# Patient Record
Sex: Female | Born: 1955 | Race: White | Hispanic: No | Marital: Single | Smoking: Never smoker
Health system: Southern US, Community
[De-identification: ages and names within clinical notes are randomized; demographics above are authoritative.]

## PROBLEM LIST (undated history)

## (undated) DIAGNOSIS — G43909 Migraine, unspecified, not intractable, without status migrainosus: Secondary | ICD-10-CM

## (undated) DIAGNOSIS — E559 Vitamin D deficiency, unspecified: Secondary | ICD-10-CM

## (undated) DIAGNOSIS — C801 Malignant (primary) neoplasm, unspecified: Secondary | ICD-10-CM

## (undated) DIAGNOSIS — E611 Iron deficiency: Secondary | ICD-10-CM

## (undated) HISTORY — DX: Vitamin D deficiency, unspecified: E55.9

---

## 1975-09-14 HISTORY — PX: MELANOMA EXCISION: SHX5266

## 2002-03-05 ENCOUNTER — Encounter: Payer: Self-pay | Admitting: Family Medicine

## 2002-03-05 ENCOUNTER — Ambulatory Visit (HOSPITAL_COMMUNITY): Admission: RE | Admit: 2002-03-05 | Discharge: 2002-03-05 | Payer: Self-pay | Admitting: Family Medicine

## 2002-03-08 ENCOUNTER — Ambulatory Visit (HOSPITAL_COMMUNITY): Admission: RE | Admit: 2002-03-08 | Discharge: 2002-03-08 | Payer: Self-pay | Admitting: Family Medicine

## 2002-03-08 ENCOUNTER — Encounter: Payer: Self-pay | Admitting: Family Medicine

## 2003-07-10 ENCOUNTER — Ambulatory Visit (HOSPITAL_COMMUNITY): Admission: RE | Admit: 2003-07-10 | Discharge: 2003-07-10 | Payer: Self-pay | Admitting: Family Medicine

## 2011-06-02 ENCOUNTER — Other Ambulatory Visit (HOSPITAL_COMMUNITY)
Admission: RE | Admit: 2011-06-02 | Discharge: 2011-06-02 | Disposition: A | Discharge status: Home / Self Care | Payer: BC Managed Care – PPO | Source: Ambulatory Visit | Attending: Obstetrics & Gynecology | Admitting: Obstetrics & Gynecology

## 2011-06-02 DIAGNOSIS — Z01419 Encounter for gynecological examination (general) (routine) without abnormal findings: Secondary | ICD-10-CM | POA: Insufficient documentation

## 2011-09-01 NOTE — Telephone Encounter (Signed)
error 

## 2012-01-28 ENCOUNTER — Other Ambulatory Visit: Payer: Self-pay | Admitting: Radiation Oncology

## 2012-08-01 ENCOUNTER — Other Ambulatory Visit (HOSPITAL_COMMUNITY)
Admission: RE | Admit: 2012-08-01 | Discharge: 2012-08-01 | Disposition: A | Discharge status: Home / Self Care | Payer: BC Managed Care – PPO | Source: Ambulatory Visit | Attending: Obstetrics & Gynecology | Admitting: Obstetrics & Gynecology

## 2012-08-01 DIAGNOSIS — Z1151 Encounter for screening for human papillomavirus (HPV): Secondary | ICD-10-CM | POA: Insufficient documentation

## 2012-08-01 DIAGNOSIS — Z01419 Encounter for gynecological examination (general) (routine) without abnormal findings: Secondary | ICD-10-CM | POA: Insufficient documentation

## 2013-03-09 ENCOUNTER — Other Ambulatory Visit (HOSPITAL_COMMUNITY): Payer: Self-pay | Admitting: Family Medicine

## 2013-03-09 DIAGNOSIS — H9209 Otalgia, unspecified ear: Secondary | ICD-10-CM

## 2013-03-09 DIAGNOSIS — I881 Chronic lymphadenitis, except mesenteric: Secondary | ICD-10-CM

## 2013-03-13 ENCOUNTER — Ambulatory Visit (HOSPITAL_COMMUNITY)
Admission: RE | Admit: 2013-03-13 | Discharge: 2013-03-13 | Disposition: A | Discharge status: Home / Self Care | Payer: BC Managed Care – PPO | Source: Ambulatory Visit | Attending: Family Medicine | Admitting: Family Medicine

## 2013-03-13 DIAGNOSIS — I889 Nonspecific lymphadenitis, unspecified: Secondary | ICD-10-CM | POA: Insufficient documentation

## 2013-03-13 DIAGNOSIS — I881 Chronic lymphadenitis, except mesenteric: Secondary | ICD-10-CM

## 2013-03-13 DIAGNOSIS — R599 Enlarged lymph nodes, unspecified: Secondary | ICD-10-CM | POA: Insufficient documentation

## 2013-03-13 DIAGNOSIS — H9209 Otalgia, unspecified ear: Secondary | ICD-10-CM | POA: Insufficient documentation

## 2013-03-29 ENCOUNTER — Telehealth: Payer: Self-pay

## 2013-03-29 NOTE — Telephone Encounter (Signed)
Pt called to schedule screening colonoscopy. She has never had one. Has hemorrhoids and appt is scheduled with Florene Glen , NP for 04/17/2013 at 2:00 PM.

## 2013-04-17 ENCOUNTER — Encounter: Payer: Self-pay | Admitting: Gastroenterology

## 2013-04-17 ENCOUNTER — Other Ambulatory Visit: Payer: Self-pay | Admitting: Gastroenterology

## 2013-04-17 ENCOUNTER — Ambulatory Visit (INDEPENDENT_AMBULATORY_CARE_PROVIDER_SITE_OTHER): Payer: BC Managed Care – PPO | Admitting: Gastroenterology

## 2013-04-17 VITALS — BP 141/86 | HR 81 | Temp 98.6°F | Ht 67.5 in | Wt 168.8 lb

## 2013-04-17 DIAGNOSIS — Z1211 Encounter for screening for malignant neoplasm of colon: Secondary | ICD-10-CM

## 2013-04-17 MED ORDER — PEG 3350-KCL-NA BICARB-NACL 420 G PO SOLR: 4000.0000 mL | ORAL | Status: DC

## 2013-04-17 NOTE — Patient Instructions (Addendum)
We have scheduled you for a colonoscopy with Dr. Darrick Penna in the near future.  Further recommendations to follow.  Have a wonderful school year!

## 2013-04-17 NOTE — Progress Notes (Signed)
  Primary Care Physician:  Colette Ribas, MD Primary Gastroenterologist:  Dr. Darrick Penna   Chief Complaint  Patient presents with  . Colonoscopy  . Hemorrhoids    HPI:   57 year old female presenting today for initial screening colonoscopy. Notes possible hemorrhoid without pain, occasional scant hematochezia. No issues with hemorrhoids at time of visit. No constipation or diarrhea. No abdominal pain. Rare heartburn, usually related to food choices. No dysphagia.   Past Medical History  Diagnosis Date  . Vitamin D deficiency     Past Surgical History  Procedure Laterality Date  . Melanoma excision  1977    Current Outpatient Prescriptions  Medication Sig Dispense Refill  . cholecalciferol (VITAMIN D) 1000 UNITS tablet Take 1,000 Units by mouth daily.       No current facility-administered medications for this visit.    Allergies as of 04/17/2013  . (No Known Allergies)    Family History  Problem Relation Age of Onset  . Colon cancer Neg Hx     History   Social History  . Marital Status: Single    Spouse Name: N/A    Number of Children: N/A  . Years of Education: N/A   Occupational History  . 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 Smoking status: Never Smoker   . Smokeless tobacco: Not on file  . Alcohol Use: Yes     Comment: once a month, rare  . Drug Use: No  . Sexually Active: Not on file   Other Topics Concern  . Not on file   Social History Narrative  . No narrative on file    Review of Systems: Gen: Denies any fever, chills, fatigue, weight loss, lack of appetite.  CV: Denies chest pain, heart palpitations, peripheral edema, syncope.  Resp: Denies shortness of breath at rest or with exertion. Denies wheezing or cough.  GI: Denies dysphagia or odynophagia. Denies jaundice, hematemesis, fecal incontinence. GU : Denies urinary burning, urinary frequency, urinary hesitancy MS: occasional  joint pain Derm: Denies rash, itching, dry skin Psych: Denies depression, anxiety, memory loss, and confusion Heme: Denies bruising, bleeding, and enlarged lymph nodes.  Physical Exam: BP 141/86  Pulse 81  Temp(Src) 98.6 F (37 C) (Oral)  Ht 5' 7.5" (1.715 m)  Wt 168 lb 12.8 oz (76.567 kg)  BMI 26.03 kg/m2 General:   Alert and oriented. Pleasant and cooperative. Well-nourished and well-developed.  Head:  Normocephalic and atraumatic. Eyes:  Without icterus, sclera clear and conjunctiva pink.  Ears:  Normal auditory acuity. Nose:  No deformity, discharge,  or lesions. Mouth:  No deformity or lesions, oral mucosa pink.  Neck:  Supple, without mass or thyromegaly. Lungs:  Clear to auscultation bilaterally. No wheezes, rales, or rhonchi. No distress.  Heart:  S1, S2 present without murmurs appreciated.  Abdomen:  +BS, soft, non-tender and non-distended. No HSM noted. No guarding or rebound. No masses appreciated.  Rectal:  Deferred until colonoscopy  Msk:  Symmetrical without gross deformities. Normal posture. Extremities:  Without clubbing or edema. Neurologic:  Alert and  oriented x4;  grossly normal neurologically. Skin:  Intact without significant lesions or rashes. Cervical Nodes:  No significant cervical adenopathy. Psych:  Alert and cooperative. Normal mood and affect.

## 2013-04-17 NOTE — Assessment & Plan Note (Signed)
57 year old female is presenting today for initial screening colonoscopy. She has no lower GI symptoms such as rectal bleeding, change in bowel habits, abdominal pain. Occasional hemorrhoid discomfort with scant paper hematochezia, likely benign anorectal source. Will need colonoscopy in near future. May be a candidate for outpatient hemorrhoid banding.   Proceed with colonoscopy with Dr. Darrick Penna in the near future. The risks, benefits, and alternatives have been discussed in detail with the patient. They state understanding and desire to proceed.

## 2013-04-19 ENCOUNTER — Encounter (HOSPITAL_COMMUNITY): Payer: Self-pay | Admitting: Pharmacy Technician

## 2013-04-19 NOTE — Progress Notes (Signed)
Cc PCP 

## 2013-04-24 ENCOUNTER — Encounter (HOSPITAL_COMMUNITY)
Admission: RE | Disposition: A | Discharge status: Home / Self Care | Payer: Self-pay | Source: Ambulatory Visit | Attending: Gastroenterology

## 2013-04-24 ENCOUNTER — Encounter (HOSPITAL_COMMUNITY): Payer: Self-pay | Admitting: *Deleted

## 2013-04-24 ENCOUNTER — Ambulatory Visit (HOSPITAL_COMMUNITY)
Admission: RE | Admit: 2013-04-24 | Discharge: 2013-04-24 | Disposition: A | Discharge status: Home / Self Care | Payer: BC Managed Care – PPO | Source: Ambulatory Visit | Attending: Gastroenterology | Admitting: Gastroenterology

## 2013-04-24 DIAGNOSIS — K644 Residual hemorrhoidal skin tags: Secondary | ICD-10-CM

## 2013-04-24 DIAGNOSIS — K648 Other hemorrhoids: Secondary | ICD-10-CM

## 2013-04-24 DIAGNOSIS — Z1211 Encounter for screening for malignant neoplasm of colon: Secondary | ICD-10-CM | POA: Insufficient documentation

## 2013-04-24 HISTORY — PX: COLONOSCOPY: SHX5424

## 2013-04-24 SURGERY — COLONOSCOPY
Anesthesia: Moderate Sedation

## 2013-04-24 MED ORDER — MIDAZOLAM HCL 5 MG/5ML IJ SOLN
INTRAMUSCULAR | Status: AC
  Filled 2013-04-24: qty 10

## 2013-04-24 MED ORDER — MIDAZOLAM HCL 5 MG/5ML IJ SOLN
INTRAMUSCULAR | Status: DC | PRN
  Administered 2013-04-24 (×3): 2 mg via INTRAVENOUS

## 2013-04-24 MED ORDER — SODIUM CHLORIDE 0.9 % IV SOLN
INTRAVENOUS | Status: DC
  Administered 2013-04-24: 13:00:00 via INTRAVENOUS

## 2013-04-24 MED ORDER — MEPERIDINE HCL 100 MG/ML IJ SOLN
INTRAMUSCULAR | Status: DC | PRN
  Administered 2013-04-24 (×3): 25 mg via INTRAVENOUS

## 2013-04-24 MED ORDER — MEPERIDINE HCL 100 MG/ML IJ SOLN
INTRAMUSCULAR | Status: AC
  Filled 2013-04-24: qty 1

## 2013-04-24 MED ORDER — STERILE WATER FOR IRRIGATION IR SOLN
Status: DC | PRN
  Administered 2013-04-24: 14:00:00

## 2013-04-24 NOTE — Op Note (Signed)
[facility] [address] [place], [address]   COLONOSCOPY PROCEDURE REPORT  PATIENT: Alicia Mcdonald, Alicia Mcdonald  MR#: [id number] BIRTHDATE: 1955/10/14 , 57  yrs. old GENDER: Female ENDOSCOPIST: Jonette Eva, MD REFERRED VZ:DGLO Phillips Odor, M.D. PROCEDURE DATE:  04/24/2013 PROCEDURE:   Colonoscopy, screening INDICATIONS:Average risk patient for colon cancer. MEDICATIONS: Demerol 75 mg IV and Versed 6 mg IV  DESCRIPTION OF PROCEDURE:    Physical exam was performed.  Informed consent was obtained from the patient after explaining the benefits, risks, and alternatives to procedure.  The patient was connected to monitor and placed in left lateral position. Continuous oxygen was provided by nasal cannula and IV medicine administered through an indwelling cannula.  After administration of sedation and rectal exam, the patients rectum was intubated and the EC-[id number] ([id number])  colonoscope was advanced under direct visualization to the ileum.  The scope was removed slowly by carefully examining the color, texture, anatomy, and integrity mucosa on the way out.  The patient was recovered in endoscopy and discharged home in satisfactory condition.       COLON FINDINGS: The mucosa appeared normal in the terminal ileum.  , A normal appearing cecum, ileocecal valve, and appendiceal orifice were identified.  The ascending, hepatic flexure, transverse, splenic flexure, descending, sigmoid colon and rectum appeared unremarkable.  No polyps or cancers were seen.  , Small internal hemorrhoids were found.  , and Moderate sized external hemorrhoids were found.  PREP QUALITY: excellent.  CECAL W/D TIME: 12 minutes COMPLICATIONS: None  ENDOSCOPIC IMPRESSION: 1.   Normal colon 2.   Small internal hemorrhoids 3.   Moderate sized external hemorrhoids   RECOMMENDATIONS: HIGH FIBER DIET TCS IN 10 YEARS       _______________________________ eSignedJonette Eva, MD 04/24/2013  3:46 PM

## 2013-04-24 NOTE — H&P (Signed)
  Primary Care Physician:  Colette Ribas, MD Primary Gastroenterologist:  Dr. Darrick Penna  Pre-Procedure History & Physical: HPI:  Alicia Mcdonald is a 57 y.o. female here for COLON CANCER SCREENING.  Past Medical History  Diagnosis Date  . Vitamin D deficiency     Past Surgical History  Procedure Laterality Date  . Melanoma excision  1977    Prior to Admission medications   Medication Sig Start Date End Date Taking? Authorizing Provider  cholecalciferol (VITAMIN D) 1000 UNITS tablet Take 1,000 Units by mouth daily.   Yes Historical Provider, MD  VITAMIN K PO Take 1 tablet by mouth daily.   Yes Historical Provider, MD    Allergies as of 04/17/2013  . (No Known Allergies)    Family History  Problem Relation Age of Onset  . Colon cancer Neg Hx     History   Social History  . Marital Status: Single    Spouse Name: N/A    Number of Children: N/A  . Years of Education: N/A   Occupational History  . 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 Smoking status: Never Smoker   . Smokeless tobacco: Not on file  . Alcohol Use: Yes     Comment: once a month, rare  . Drug Use: No  . Sexually Active: Not on file   Other Topics Concern  . Not on file   Social History Narrative  . No narrative on file    Review of Systems: See HPI, otherwise negative ROS   Physical Exam: BP 148/81  Pulse 74  Temp(Src) 97.8 F (36.6 C) (Oral)  Resp 19  SpO2 93% General:   Alert,  pleasant and cooperative in NAD Head:  Normocephalic and atraumatic. Neck:  Supple; Lungs:  Clear throughout to auscultation.    Heart:  Regular rate and rhythm. Abdomen:  Soft, nontender and nondistended. Normal bowel sounds, without guarding, and without rebound.   Neurologic:  Alert and  oriented x4;  grossly normal neurologically.  Impression/Plan:     SCREENING  Plan:  1. TCS TODAY

## 2013-04-27 ENCOUNTER — Encounter (HOSPITAL_COMMUNITY): Payer: Self-pay | Admitting: Gastroenterology

## 2013-11-27 NOTE — Progress Notes (Signed)
REVIEWED. TCS AUG 2014 [facility]

## 2015-05-06 ENCOUNTER — Other Ambulatory Visit: Payer: Self-pay | Admitting: Obstetrics & Gynecology

## 2015-07-11 ENCOUNTER — Encounter: Payer: Self-pay | Admitting: Obstetrics & Gynecology

## 2015-07-11 ENCOUNTER — Other Ambulatory Visit (HOSPITAL_COMMUNITY)
Admission: RE | Admit: 2015-07-11 | Discharge: 2015-07-11 | Disposition: A | Discharge status: Home / Self Care | Payer: BC Managed Care – PPO | Source: Ambulatory Visit | Attending: Obstetrics & Gynecology | Admitting: Obstetrics & Gynecology

## 2015-07-11 ENCOUNTER — Ambulatory Visit (INDEPENDENT_AMBULATORY_CARE_PROVIDER_SITE_OTHER): Payer: BC Managed Care – PPO | Admitting: Obstetrics & Gynecology

## 2015-07-11 VITALS — BP 120/80 | HR 74 | Ht 67.2 in | Wt 164.3 lb

## 2015-07-11 DIAGNOSIS — Z01419 Encounter for gynecological examination (general) (routine) without abnormal findings: Secondary | ICD-10-CM | POA: Diagnosis present

## 2015-07-11 DIAGNOSIS — Z1151 Encounter for screening for human papillomavirus (HPV): Secondary | ICD-10-CM | POA: Diagnosis present

## 2015-07-11 DIAGNOSIS — Z1211 Encounter for screening for malignant neoplasm of colon: Secondary | ICD-10-CM

## 2015-07-11 DIAGNOSIS — Z1212 Encounter for screening for malignant neoplasm of rectum: Secondary | ICD-10-CM

## 2015-07-11 NOTE — Progress Notes (Signed)
Patient ID: Alicia Mcdonald, female   DOB: 1956/06/27, 59 y.o.   MRN: [id number] Subjective:     Alicia Mcdonald is a 59 y.o. female here for a routine exam.  No LMP recorded. Patient is postmenopausal. No obstetric history on file. Birth Control Method:  Post menopausal Menstrual Calendar(currently): post menopausal  Current complaints: none.   Current acute medical issues:  none   Recent Gynecologic History No LMP recorded. Patient is postmenopausal. Last Pap: 2013,  normal Last mammogram: pt declines doing screenign mammography, I have recommended breast thermography,    Past Medical History  Diagnosis Date  . Vitamin D deficiency     Past Surgical History  Procedure Laterality Date  . Melanoma excision  1977  . Colonoscopy N/A 04/24/2013    Procedure: COLONOSCOPY;  Surgeon: Danie Binder, MD;  [facility];  Service: Endoscopy;  Laterality: N/A;  1:45    OB History    No data available      Social History   Social History  . Marital Status: Single    Spouse Name: N/A  . Number of Children: N/A  . Years of Education: N/A   Occupational History  . 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 Smoking status: Never Smoker   . Smokeless tobacco: None  . Alcohol Use: Yes     Comment: once a month, rare  . Drug Use: No  . Sexual Activity: Not Asked   Other Topics Concern  . None   Social History Narrative    Family History  Problem Relation Age of Onset  . Colon cancer Neg Hx      Current outpatient prescriptions:  .  cholecalciferol (VITAMIN D) 1000 UNITS tablet, Take 1,000 Units by mouth daily., Disp: , Rfl:  .  VITAMIN K PO, Take 1 tablet by mouth daily., Disp: , Rfl:   Review of Systems  Review of Systems  Constitutional: Negative for fever, chills, weight loss, malaise/fatigue and diaphoresis.  HENT: Negative for hearing loss, ear pain, nosebleeds, congestion, sore throat, neck pain,  tinnitus and ear discharge.   Eyes: Negative for blurred vision, double vision, photophobia, pain, discharge and redness.  Respiratory: Negative for cough, hemoptysis, sputum production, shortness of breath, wheezing and stridor.   Cardiovascular: Negative for chest pain, palpitations, orthopnea, claudication, leg swelling and PND.  Gastrointestinal: negative for abdominal pain. Negative for heartburn, nausea, vomiting, diarrhea, constipation, blood in stool and melena.  Genitourinary: Negative for dysuria, urgency, frequency, hematuria and flank pain.  Musculoskeletal: Negative for myalgias, back pain, joint pain and falls.  Skin: Negative for itching and rash.  Neurological: Negative for dizziness, tingling, tremors, sensory change, speech change, focal weakness, seizures, loss of consciousness, weakness and headaches.  Endo/Heme/Allergies: Negative for environmental allergies and polydipsia. Does not bruise/bleed easily.  Psychiatric/Behavioral: Negative for depression, suicidal ideas, hallucinations, memory loss and substance abuse. The patient is not nervous/anxious and does not have insomnia.        Objective:  Blood pressure 120/80, pulse 74, height 5' 7.2" (1.707 m), weight 164 lb 4.8 oz (74.526 kg).   Physical Exam  Vitals reviewed. Constitutional: She is oriented to person, place, and time. She appears well-developed and well-nourished.  HENT:  Head: Normocephalic and atraumatic.        Right Ear: External ear normal.  Left Ear: External ear normal.  Nose: Nose normal.  Mouth/Throat: Oropharynx is clear and moist.  Eyes: Conjunctivae and EOM are normal. Pupils are equal, round, and reactive to light. Right eye exhibits no discharge. Left eye exhibits no discharge. No scleral icterus.  Neck: Normal range of motion. Neck supple. No tracheal deviation present. No thyromegaly present.  Cardiovascular: Normal rate, regular rhythm, normal heart sounds and intact distal pulses.  Exam  reveals no gallop and no friction rub.   No murmur heard. Respiratory: Effort normal and breath sounds normal. No respiratory distress. She has no wheezes. She has no rales. She exhibits no tenderness.  GI: Soft. Bowel sounds are normal. She exhibits no distension and no mass. There is no tenderness. There is no rebound and no guarding.  Genitourinary:  Breasts no masses skin changes or nipple changes bilaterally      Vulva is normal without lesions Vagina is pink moist without discharge Cervix normal in appearance and pap is done Uterus is normal size shape and contour Adnexa is negative with normal sized ovaries  {Rectal    hemoccult negative, normal tone, no masses  Musculoskeletal: Normal range of motion. She exhibits no edema and no tenderness.  Neurological: She is alert and oriented to person, place, and time. She has normal reflexes. She displays normal reflexes. No cranial nerve deficit. She exhibits normal muscle tone. Coordination normal.  Skin: Skin is warm and dry. No rash noted. No erythema. No pallor.  Psychiatric: She has a normal mood and affect. Her behavior is normal. Judgment and thought content normal.       Assessment:    Healthy female exam.    Plan:    Follow up in: 2 years. thermography of breast was discussed and recommended

## 2015-07-15 LAB — CYTOLOGY - PAP

## 2015-08-29 ENCOUNTER — Telehealth: Payer: Self-pay | Admitting: Obstetrics & Gynecology

## 2015-08-29 NOTE — Telephone Encounter (Signed)
appt scheduled for 09/03/2015 ultrasound and Dr. Elonda Husky.

## 2015-08-29 NOTE — Telephone Encounter (Signed)
Please make an appointment for a gyn sonogram then see me after her sonogram next week

## 2015-09-02 ENCOUNTER — Other Ambulatory Visit: Payer: Self-pay | Admitting: Obstetrics & Gynecology

## 2015-09-02 ENCOUNTER — Telehealth: Payer: Self-pay | Admitting: *Deleted

## 2015-09-02 DIAGNOSIS — N950 Postmenopausal bleeding: Secondary | ICD-10-CM

## 2015-09-02 NOTE — Telephone Encounter (Signed)
Message left on pt voicemail with pt approval, to keep her appt tomorrow for u/s and Dr. Elonda Husky rare that bleeding was caused by the pap since pap done in 06/2015.

## 2015-09-03 ENCOUNTER — Encounter: Payer: Self-pay | Admitting: Obstetrics & Gynecology

## 2015-09-03 ENCOUNTER — Ambulatory Visit (INDEPENDENT_AMBULATORY_CARE_PROVIDER_SITE_OTHER): Payer: BC Managed Care – PPO | Admitting: Obstetrics & Gynecology

## 2015-09-03 ENCOUNTER — Ambulatory Visit (INDEPENDENT_AMBULATORY_CARE_PROVIDER_SITE_OTHER): Payer: BC Managed Care – PPO

## 2015-09-03 ENCOUNTER — Other Ambulatory Visit: Payer: Self-pay | Admitting: Obstetrics & Gynecology

## 2015-09-03 VITALS — BP 150/90 | HR 92 | Wt 161.0 lb

## 2015-09-03 DIAGNOSIS — N950 Postmenopausal bleeding: Secondary | ICD-10-CM

## 2015-09-03 NOTE — Progress Notes (Signed)
PELVIC US TA/TV: heterogenous anteverted uterus,normal rt ov, simple lt ov cyst 1.4 x .7 x 1cm,EEC 4.8mm,bladder debris (unable to move)

## 2015-09-22 ENCOUNTER — Telehealth: Payer: Self-pay | Admitting: Obstetrics & Gynecology

## 2015-09-22 NOTE — Telephone Encounter (Signed)
Cough will not cause bleeding from the endometrium  Did she miss follow up appt?  In any event no further testing needed at this point with an adequate biopsy

## 2015-09-22 NOTE — Telephone Encounter (Signed)
Pt informed of Benign results from Endometrial Biopsy on 09/03/2015. Pt states she has not had anymore vaginal bleeding but states Dr. Elonda Husky did mention possible additional testing. Pt states she had been having a constant cough and did not know if that could have caused the vaginal bleeding because now the cough has stopped and the vaginal bleeding has too.  Please advise.

## 2015-09-22 NOTE — Telephone Encounter (Signed)
Pt called requesting results, pt states she had it done on the 12/21. Please contact pt

## 2015-10-19 NOTE — Progress Notes (Signed)
Patient ID: Alicia Mcdonald, female   DOB: Dec 23, 1955, 60 y.o.   MRN: [id number] Follow up appointment for results and endometrial biopsy  Chief Complaint  Patient presents with  . Follow-up    ultrasound today.    Blood pressure 150/90, pulse 92, weight 161 lb (73.029 kg).  GYNECOLOGIC SONOGRAM   Alicia Mcdonald is a 60 y.o. for a pelvic sonogram for postmenopausal bleeding.  Uterus 5.4 x 2.2 x 3.3 cm, heterogenous anteverted uterus  Endometrium 4.8 mm, symmetrical   Right ovary 2 x 1 x 1.6 cm, wnl  Left ovary 2.2 x .9 x 2 cm, simple lt ov cyst 1.4 x .7 x 1 cm    No free fluid   Technician Comments:  PELVIC US TA/TV: heterogenous anteverted uterus,normal rt ov, simple lt ov cyst 1.4 x .7 x 1cm,EEC 4.46mm,bladder debris (non mobile)    [employer] 09/03/2015 3:24 PM  Clinical Impression and recommendations:  I have reviewed the sonogram results above, combined with the patient's current clinical course, below are my impressions and any appropriate recommendations for management based on the sonographic findings.  Slightly thickened endometrium for a post menoapusal woman Normal uterus  Both ovaries normal with simple cyst left ovary  Sonographic suspicion of a bladder mass, polyp/lesion that will require urology evaluation, most probably with cystoscopy   [employer]rell [place] H 09/03/2015 4:05 PM    MEDS ordered this encounter: No orders of the defined types were placed in this encounter.    Orders for this encounter: Orders Placed This Encounter  Procedures  . Endometrial biopsy     ICD-9-CM ICD-10-CM   1. Post-menopause bleeding 627.1 N95.0 Endometrial biopsy    Plan: endometrial biopsy today  Follow Up:   Endometrial Biopsy Procedure Note  Pre-operative Diagnosis:    ICD-9-CM ICD-10-CM   1. Post-menopause bleeding 627.1 N95.0 Endometrial biopsy    Post-operative Diagnosis:  same  Indications: postmenopausal bleeding  Procedure Details   Urine pregnancy test was not done.  The risks (including infection, bleeding, pain, and uterine perforation) and benefits of the procedure were explained to the patient and Written informed consent was obtained.  Antibiotic prophylaxis against endocarditis was not indicated.   The patient was placed in the dorsal lithotomy position.  Bimanual exam showed the uterus to be in the neutral position.  A Graves' speculum inserted in the vagina, and the cervix prepped with povidone iodine.  Endocervical curettage with a Kevorkian curette was not performed.   A sharp tenaculum was applied to the anterior lip of the cervix for stabilization.  A sterile uterine sound was used to sound the uterus to a depth of 6cm.  A Pipelle endometrial aspirator was used to sample the endometrium.  Sample was sent for pathologic examination.  Condition: Stable  Complications: None  Plan:  The patient was advised to call for any fever or for prolonged or severe pain or bleeding. She was advised to use OTC analgesics as needed for mild to moderate pain. She was advised to avoid vaginal intercourse for 48 hours or until the bleeding has completely stopped.  Attending Physician Documentation: I was present for or performed the following: endometrial biopsy      All questions were answered.  Past Medical History  Diagnosis Date  . Vitamin D deficiency     Past Surgical History  Procedure Laterality Date  . Melanoma excision  1977  . Colonoscopy N/A 04/24/2013    Procedure: COLONOSCOPY;  Surgeon: Danie Binder, MD;  [facility];  Service: Endoscopy;  Laterality: N/A;  1:45    OB History    No data available      No Known Allergies  Social History   Social History  . Marital Status: Single    Spouse Name: N/A  . Number of Children: N/A  . Years of Education: N/A   Occupational History  . 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 Smoking status: Never Smoker   . Smokeless tobacco: None  . Alcohol Use: Yes     Comment: once a month, rare  . Drug Use: No  . Sexual Activity: Not Asked   Other Topics Concern  . None   Social History Narrative    Family History  Problem Relation Age of Onset  . Colon cancer Neg Hx

## 2019-12-14 ENCOUNTER — Ambulatory Visit: Payer: BC Managed Care – PPO | Attending: Internal Medicine

## 2019-12-14 DIAGNOSIS — Z23 Encounter for immunization: Secondary | ICD-10-CM

## 2019-12-14 NOTE — Progress Notes (Signed)
   Covid-19 Vaccination Clinic  Name:  ROSAMARY KENKEL    MRN: [id number] DOB: 07-13-1956  12/14/2019  Ms. Novick was observed post Covid-19 immunization for 15 minutes without incident. She was provided with Vaccine Information Sheet and instruction to access the V-Safe system.   Ms. League was instructed to call 911 with any severe reactions post vaccine: Marland Kitchen Difficulty breathing  . Swelling of face and throat  . A fast heartbeat  . A bad rash all over body  . Dizziness and weakness   Immunizations Administered    Name Date Dose VIS Date Route   Moderna COVID-19 Vaccine 12/14/2019 12:10 PM 0.5 mL 08/14/2019 Intramuscular   Manufacturer: Moderna   Lot: [id number]   [id number]

## 2020-01-16 ENCOUNTER — Ambulatory Visit: Payer: BC Managed Care – PPO

## 2020-02-03 ENCOUNTER — Other Ambulatory Visit: Payer: Self-pay

## 2020-02-03 ENCOUNTER — Emergency Department (HOSPITAL_COMMUNITY): Payer: BC Managed Care – PPO

## 2020-02-03 ENCOUNTER — Encounter (HOSPITAL_COMMUNITY): Payer: Self-pay

## 2020-02-03 ENCOUNTER — Inpatient Hospital Stay (HOSPITAL_COMMUNITY)
Admission: EM | Admit: 2020-02-03 | Discharge: 2020-02-10 | DRG: 026 | Disposition: A | Discharge status: Home / Self Care | Payer: BC Managed Care – PPO | Attending: Internal Medicine | Admitting: Internal Medicine

## 2020-02-03 DIAGNOSIS — R4182 Altered mental status, unspecified: Secondary | ICD-10-CM | POA: Diagnosis not present

## 2020-02-03 DIAGNOSIS — Z20822 Contact with and (suspected) exposure to covid-19: Secondary | ICD-10-CM | POA: Diagnosis present

## 2020-02-03 DIAGNOSIS — C710 Malignant neoplasm of cerebrum, except lobes and ventricles: Secondary | ICD-10-CM | POA: Diagnosis present

## 2020-02-03 DIAGNOSIS — E871 Hypo-osmolality and hyponatremia: Secondary | ICD-10-CM | POA: Diagnosis present

## 2020-02-03 DIAGNOSIS — G4089 Other seizures: Secondary | ICD-10-CM | POA: Diagnosis present

## 2020-02-03 DIAGNOSIS — R29709 NIHSS score 9: Secondary | ICD-10-CM | POA: Diagnosis present

## 2020-02-03 DIAGNOSIS — E559 Vitamin D deficiency, unspecified: Secondary | ICD-10-CM | POA: Diagnosis present

## 2020-02-03 DIAGNOSIS — G9389 Other specified disorders of brain: Secondary | ICD-10-CM

## 2020-02-03 DIAGNOSIS — R410 Disorientation, unspecified: Secondary | ICD-10-CM | POA: Diagnosis not present

## 2020-02-03 DIAGNOSIS — R569 Unspecified convulsions: Secondary | ICD-10-CM

## 2020-02-03 DIAGNOSIS — D496 Neoplasm of unspecified behavior of brain: Secondary | ICD-10-CM

## 2020-02-03 DIAGNOSIS — E876 Hypokalemia: Secondary | ICD-10-CM | POA: Diagnosis present

## 2020-02-03 DIAGNOSIS — Z781 Physical restraint status: Secondary | ICD-10-CM

## 2020-02-03 DIAGNOSIS — E1165 Type 2 diabetes mellitus with hyperglycemia: Secondary | ICD-10-CM | POA: Diagnosis present

## 2020-02-03 DIAGNOSIS — Z85820 Personal history of malignant melanoma of skin: Secondary | ICD-10-CM | POA: Diagnosis not present

## 2020-02-03 DIAGNOSIS — C719 Malignant neoplasm of brain, unspecified: Secondary | ICD-10-CM

## 2020-02-03 DIAGNOSIS — G911 Obstructive hydrocephalus: Secondary | ICD-10-CM | POA: Diagnosis present

## 2020-02-03 DIAGNOSIS — R4701 Aphasia: Secondary | ICD-10-CM | POA: Diagnosis present

## 2020-02-03 DIAGNOSIS — R220 Localized swelling, mass and lump, head: Secondary | ICD-10-CM | POA: Diagnosis not present

## 2020-02-03 HISTORY — DX: Malignant (primary) neoplasm, unspecified: C80.1

## 2020-02-03 HISTORY — DX: Migraine, unspecified, not intractable, without status migrainosus: G43.909

## 2020-02-03 HISTORY — DX: Iron deficiency: E61.1

## 2020-02-03 LAB — DIFFERENTIAL
Abs Immature Granulocytes: 0.03 10*3/uL (ref 0.00–0.07)
Basophils Absolute: 0 10*3/uL (ref 0.0–0.1)
Basophils Relative: 0 %
Eosinophils Absolute: 0 10*3/uL (ref 0.0–0.5)
Eosinophils Relative: 0 %
Immature Granulocytes: 0 %
Lymphocytes Relative: 6 %
Lymphs Abs: 0.5 10*3/uL — ABNORMAL LOW (ref 0.7–4.0)
Monocytes Absolute: 0.3 10*3/uL (ref 0.1–1.0)
Monocytes Relative: 3 %
Neutro Abs: 8.3 10*3/uL — ABNORMAL HIGH (ref 1.7–7.7)
Neutrophils Relative %: 91 %

## 2020-02-03 LAB — COMPREHENSIVE METABOLIC PANEL
ALT: 26 U/L (ref 0–44)
AST: 26 U/L (ref 15–41)
Albumin: 4.4 g/dL (ref 3.5–5.0)
Alkaline Phosphatase: 70 U/L (ref 38–126)
Anion gap: 14 (ref 5–15)
BUN: 17 mg/dL (ref 8–23)
CO2: 22 mmol/L (ref 22–32)
Calcium: 9.1 mg/dL (ref 8.9–10.3)
Chloride: 90 mmol/L — ABNORMAL LOW (ref 98–111)
Creatinine, Ser: 0.51 mg/dL (ref 0.44–1.00)
GFR calc Af Amer: >60 mL/min (ref >60)
GFR calc non Af Amer: >60 mL/min (ref >60)
Glucose, Bld: 209 mg/dL — ABNORMAL HIGH (ref 70–99)
Potassium: 3.4 mmol/L — ABNORMAL LOW (ref 3.5–5.1)
Sodium: 126 mmol/L — ABNORMAL LOW (ref 135–145)
Total Bilirubin: 0.9 mg/dL (ref 0.3–1.2)
Total Protein: 7.2 g/dL (ref 6.5–8.1)

## 2020-02-03 LAB — ETHANOL: Alcohol, Ethyl (B): <10 mg/dL (ref <10)

## 2020-02-03 LAB — TSH: TSH: 0.646 u[IU]/mL (ref 0.350–4.500)

## 2020-02-03 LAB — HEMOGLOBIN A1C
Hgb A1c MFr Bld: 6.8 % — ABNORMAL HIGH (ref 4.8–5.6)
Mean Plasma Glucose: 148.46 mg/dL

## 2020-02-03 LAB — URINALYSIS, ROUTINE W REFLEX MICROSCOPIC
Bilirubin Urine: NEGATIVE
Glucose, UA: 150 mg/dL — AB
Hgb urine dipstick: NEGATIVE
Ketones, ur: 20 mg/dL — AB
Leukocytes,Ua: NEGATIVE
Nitrite: NEGATIVE
Protein, ur: NEGATIVE mg/dL
Specific Gravity, Urine: 1.016 (ref 1.005–1.030)
pH: 7 (ref 5.0–8.0)

## 2020-02-03 LAB — OSMOLALITY: Osmolality: 276 mOsm/kg (ref 275–295)

## 2020-02-03 LAB — CBC
HCT: 40 % (ref 36.0–46.0)
Hemoglobin: 13.4 g/dL (ref 12.0–15.0)
MCH: 29.7 pg (ref 26.0–34.0)
MCHC: 33.5 g/dL (ref 30.0–36.0)
MCV: 88.7 fL (ref 80.0–100.0)
Platelets: 272 10*3/uL (ref 150–400)
RBC: 4.51 MIL/uL (ref 3.87–5.11)
RDW: 12.5 % (ref 11.5–15.5)
WBC: 9.1 10*3/uL (ref 4.0–10.5)
nRBC: 0 % (ref 0.0–0.2)

## 2020-02-03 LAB — CBG MONITORING, ED
Glucose-Capillary: 188 mg/dL — ABNORMAL HIGH (ref 70–99)
Glucose-Capillary: 168 mg/dL — ABNORMAL HIGH (ref 70–99)

## 2020-02-03 LAB — HIV ANTIBODY (ROUTINE TESTING W REFLEX): HIV Screen 4th Generation wRfx: NONREACTIVE

## 2020-02-03 LAB — APTT: aPTT: 27 seconds (ref 24–36)

## 2020-02-03 LAB — RAPID URINE DRUG SCREEN, HOSP PERFORMED
Amphetamines: NOT DETECTED
Barbiturates: NOT DETECTED
Benzodiazepines: NOT DETECTED
Cocaine: NOT DETECTED
Opiates: NOT DETECTED
Tetrahydrocannabinol: NOT DETECTED

## 2020-02-03 LAB — GLUCOSE, CAPILLARY
Glucose-Capillary: 123 mg/dL — ABNORMAL HIGH (ref 70–99)
Glucose-Capillary: 137 mg/dL — ABNORMAL HIGH (ref 70–99)

## 2020-02-03 LAB — SARS CORONAVIRUS 2 BY RT PCR (HOSPITAL ORDER, PERFORMED IN ~~LOC~~ HOSPITAL LAB): SARS Coronavirus 2: NEGATIVE

## 2020-02-03 LAB — PROTIME-INR
INR: 1 (ref 0.8–1.2)
Prothrombin Time: 12.7 seconds (ref 11.4–15.2)

## 2020-02-03 MED ORDER — LORAZEPAM 2 MG/ML IJ SOLN
1.0000 mg | Freq: Once | INTRAMUSCULAR | Status: AC
  Administered 2020-02-03: 1 mg via INTRAVENOUS
  Filled 2020-02-03: qty 1

## 2020-02-03 MED ORDER — POTASSIUM CHLORIDE 10 MEQ/100ML IV SOLN
10.0000 meq | INTRAVENOUS | Status: AC
  Administered 2020-02-03 (×4): 10 meq via INTRAVENOUS
  Filled 2020-02-03 (×4): qty 100

## 2020-02-03 MED ORDER — ONDANSETRON HCL 4 MG PO TABS: 4.0000 mg | ORAL_TABLET | Freq: Four times a day (QID) | ORAL | Status: DC | PRN

## 2020-02-03 MED ORDER — LORAZEPAM 2 MG/ML IJ SOLN: 1.0000 mg | Freq: Once | INTRAMUSCULAR | Status: DC

## 2020-02-03 MED ORDER — HALOPERIDOL LACTATE 5 MG/ML IJ SOLN
2.0000 mg | Freq: Four times a day (QID) | INTRAMUSCULAR | Status: DC | PRN
  Administered 2020-02-03 – 2020-02-04 (×2): 2 mg via INTRAVENOUS
  Filled 2020-02-03 (×2): qty 1

## 2020-02-03 MED ORDER — ACETAMINOPHEN 325 MG PO TABS: 650.0000 mg | ORAL_TABLET | Freq: Four times a day (QID) | ORAL | Status: DC | PRN

## 2020-02-03 MED ORDER — DEXAMETHASONE SODIUM PHOSPHATE 4 MG/ML IJ SOLN
4.0000 mg | Freq: Four times a day (QID) | INTRAMUSCULAR | Status: DC
  Administered 2020-02-03 – 2020-02-04 (×2): 4 mg via INTRAVENOUS
  Filled 2020-02-03 (×2): qty 1

## 2020-02-03 MED ORDER — ONDANSETRON HCL 4 MG/2ML IJ SOLN
4.0000 mg | Freq: Once | INTRAMUSCULAR | Status: AC
  Administered 2020-02-03: 4 mg via INTRAVENOUS
  Filled 2020-02-03: qty 2

## 2020-02-03 MED ORDER — SODIUM CHLORIDE 0.9% FLUSH
3.0000 mL | Freq: Once | INTRAVENOUS | Status: AC
  Administered 2020-02-03: 3 mL via INTRAVENOUS

## 2020-02-03 MED ORDER — IOHEXOL 350 MG/ML SOLN: 100.0000 mL | Freq: Once | INTRAVENOUS | Status: DC | PRN

## 2020-02-03 MED ORDER — DEXAMETHASONE SODIUM PHOSPHATE 10 MG/ML IJ SOLN
10.0000 mg | Freq: Once | INTRAMUSCULAR | Status: AC
  Administered 2020-02-03: 10 mg via INTRAVENOUS
  Filled 2020-02-03: qty 1

## 2020-02-03 MED ORDER — SODIUM CHLORIDE 0.9 % IV BOLUS
500.0000 mL | Freq: Once | INTRAVENOUS | Status: AC
  Administered 2020-02-03: 500 mL via INTRAVENOUS

## 2020-02-03 MED ORDER — INSULIN ASPART 100 UNIT/ML ~~LOC~~ SOLN
0.0000 [IU] | SUBCUTANEOUS | Status: DC
  Administered 2020-02-03: 1 [IU] via SUBCUTANEOUS
  Administered 2020-02-03: 2 [IU] via SUBCUTANEOUS
  Administered 2020-02-04: 1 [IU] via SUBCUTANEOUS
  Administered 2020-02-04: 2 [IU] via SUBCUTANEOUS
  Administered 2020-02-04: 1 [IU] via SUBCUTANEOUS
  Administered 2020-02-06: 2 [IU] via SUBCUTANEOUS
  Administered 2020-02-06: 3 [IU] via SUBCUTANEOUS
  Administered 2020-02-07: 5 [IU] via SUBCUTANEOUS
  Administered 2020-02-07 (×2): 3 [IU] via SUBCUTANEOUS
  Administered 2020-02-07: 2 [IU] via SUBCUTANEOUS
  Administered 2020-02-07: 5 [IU] via SUBCUTANEOUS
  Administered 2020-02-08 (×3): 1 [IU] via SUBCUTANEOUS
  Administered 2020-02-08 (×2): 3 [IU] via SUBCUTANEOUS
  Administered 2020-02-08: 2 [IU] via SUBCUTANEOUS
  Administered 2020-02-09: 3 [IU] via SUBCUTANEOUS
  Administered 2020-02-09 – 2020-02-10 (×4): 1 [IU] via SUBCUTANEOUS
  Administered 2020-02-10: 2 [IU] via SUBCUTANEOUS
  Filled 2020-02-03: qty 1

## 2020-02-03 MED ORDER — SODIUM CHLORIDE 0.9 % IV SOLN
INTRAVENOUS | Status: DC
  Administered 2020-02-03: 500 mL via INTRAVENOUS

## 2020-02-03 MED ORDER — ONDANSETRON HCL 4 MG/2ML IJ SOLN: 4.0000 mg | Freq: Four times a day (QID) | INTRAMUSCULAR | Status: DC | PRN

## 2020-02-03 MED ORDER — LEVETIRACETAM IN NACL 500 MG/100ML IV SOLN: 500.0000 mg | Freq: Once | INTRAVENOUS | Status: DC

## 2020-02-03 MED ORDER — LEVETIRACETAM IN NACL 1500 MG/100ML IV SOLN
1500.0000 mg | Freq: Once | INTRAVENOUS | Status: AC
  Administered 2020-02-03: 1500 mg via INTRAVENOUS
  Filled 2020-02-03: qty 100

## 2020-02-03 MED ORDER — ACETAMINOPHEN 650 MG RE SUPP: 650.0000 mg | Freq: Four times a day (QID) | RECTAL | Status: DC | PRN

## 2020-02-03 MED ORDER — LEVETIRACETAM IN NACL 500 MG/100ML IV SOLN
500.0000 mg | Freq: Two times a day (BID) | INTRAVENOUS | Status: DC
  Administered 2020-02-04 – 2020-02-07 (×8): 500 mg via INTRAVENOUS
  Filled 2020-02-03 (×10): qty 100

## 2020-02-03 MED ORDER — LORAZEPAM 2 MG/ML IJ SOLN
2.0000 mg | INTRAMUSCULAR | Status: DC | PRN
  Administered 2020-02-03: 2 mg via INTRAVENOUS
  Filled 2020-02-03: qty 1

## 2020-02-03 NOTE — Consult Note (Signed)
[facility] Tele[facility] Consult Services   Date of Service:   02/03/2020 10:19:48  Impression:     .  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  Metrics: Last Known Well: 02/02/2020 23:00:00 [facility] Notification Time: 02/03/2020 10:19:47 Arrival Time: 02/03/2020 09:52:00 Stamp Time: 02/03/2020 10:19:48 Time First Login Attempt: 02/03/2020 10:23:00 Symptoms: AMS NIHSS Start Assessment Time: 02/03/2020 10:26:00 Patient is not a candidate for Alteplase/Activase. Alteplase Medical Decision: 02/03/2020 10:26:00 Patient was not deemed candidate for Alteplase/Activase thrombolytics because of following reasons: Intracranial Intra-Axial Neoplasm.  CT head was reviewed and results were: hypodensity in the genu of the corpus callosum with surrounding mass effect concerning for glioma  Clinical Presentation is not Suggestive of Large Vessel Occlusive Disease  ED Physician notified of diagnostic impression and management plan on 02/03/2020 10:38:00  Our recommendations are outlined below.  Recommendations:     .  Dexamethasone 10mg  now     .  Load Keppra 1500mg  now (in event she is having focal seizure activity)     .  EEG     .  MRI brain wow     .  Neurosurgery consultation (prob higher yield after MRI done)     .  Oncology consultation (prob higher yield after MRI done)  Routine Consultation with [facility] for Follow up Care  Sign Out:     .  Discussed with Emergency Department Provider    ------------------------------------------------------------------------------  History of Present Illness: Patient is a 64 year old Female.  Patient was brought by EMS for symptoms of AMS  Patient with no significant chronic medical  issues. She was reporting a HA to her mother yesterday, and then started vomiting prior to bed. Her mother then found her at 72 in the bathroom confused and not making sense with her speech. Patient continued to decline with worsening language function and agitation. EMS was summoned.        NIHSS may not be reliable due to: patient agitated and encephalopathic  Examination: BP(143/77), Pulse(60), Blood Glucose(223) 1A: Level of Consciousness - Alert; keenly responsive + 0 1B: Ask Month and Age - Aphasic + 2 1C: Blink Eyes & Squeeze Hands - Performs 0 Tasks + 2 2: Test Horizontal Extraocular Movements - Normal + 0 3: Test Visual Fields - No Visual Loss + 0 4: Test Facial Palsy (Use Grimace if Obtunded) - Normal symmetry + 0 5A: Test Left Arm Motor Drift - No Drift for 10 Seconds + 0 (pulling with great force in all extremities against nursing) 5B: Test Right Arm Motor Drift - No Drift for 10 Seconds + 0 6A: Test Left Leg Motor Drift - No Drift for 5 Seconds + 0 6B: Test Right Leg Motor Drift - No Drift for 5 Seconds + 0 7: Test Limb Ataxia (FNF/Heel-Shin) - No Ataxia + 0 8: Test Sensation - Normal; No sensory loss + 0 9: Test Language/Aphasia - Mute/Global Aphasia: No Usable Speech/Auditory Comprehension + 3 10: Test Dysarthria - Mute/Anarthric + 2 11: Test Extinction/Inattention - No abnormality + 0  NIHSS Score: 9  Pre-Morbid Modified Ranking Scale: 0 Points = No symptoms at all   Patient/Family was informed the Neurology Consult would occur via TeleHealth consult by way of interactive audio and video telecommunications and consented to receiving care in this manner.   Patient is being evaluated for  possible acute neurologic impairment and high probability of imminent or life-threatening deterioration. I spent total of 20 minutes providing care to this patient, including time for face to face visit via telemedicine, review of medical records, imaging studies and discussion of  findings with providers, the patient and/or family.   Dr Arne Cleveland   [facility] [phone]  Case [id number]

## 2020-02-03 NOTE — H&P (Signed)
History and Physical    Alicia Mcdonald [id number] DOB: 06-09-1956 DOA: 02/03/2020  PCP: Redmond School, MD   Patient coming from: Home  Chief Complaint: Altered mental status  HPI: Alicia Mcdonald is a 64 y.o. female with medical history significant for vitamin D deficiency who was brought to the ED via EMS after her mother found her in the bathroom with significant confusion.  She was also noted to have vomited.  Apparently patient has been having severe headaches for several days and at the time of EMS arrival her speech was quite incoherent.  No other symptoms of fevers, chills, chest pain, shortness of breath, cough, abdominal pain, or diarrhea reported by the mother.  No specific weakness or numbness to the extremities or visual field deficits have been noted.  Patient is currently quite somnolent and encephalopathic and history could not be obtained directly.  I have called the mother who is the primary historian.  No other significant chronic medical issues are noted.   ED Course: Stable vital signs are noted and patient has EKG in sinus rhythm.  Serum sodium is 126 and potassium is 3.4 with glucose of 209.  Her 1 view chest x-ray demonstrates some left lower lobe opacity which appears to reflect atelectasis and Covid testing is pending.  CT scanning of the head was immediately performed on arrival under code stroke protocol and she is noted to have an anterior hemispheric brain mass suspicious for glioma versus lymphoma.  Teleneurology suspects that patient may have had a seizure and thus she has been loaded with Keppra and given some Ativan.  She has also been started on IV dexamethasone 10 mg and recommendations have been made for brain MRI as well as EEG evaluation.  EDP has also discussed case with neurosurgery Dr. Christella Noa who recommends transfer to [facility] for further evaluation.  Review of Systems: Cannot be obtained due to patient condition.  Past Medical History:  Diagnosis Date    . Cancer ([place])   . Iron deficiency   . Migraine   . Vitamin D deficiency     Past Surgical History:  Procedure Laterality Date  . COLONOSCOPY N/A 04/24/2013   Procedure: COLONOSCOPY;  Surgeon: Danie Binder, MD;  Location: [facility] ENDO SUITE;  Service: Endoscopy;  Laterality: N/A;  1:45  . MELANOMA EXCISION  1977     reports that she has never smoked. She has never used smokeless tobacco. She reports current alcohol use. She reports that she does not use drugs.  No Known Allergies  Family History  Problem Relation Age of Onset  . Colon cancer Neg Hx     Prior to Admission medications   Medication Sig Start Date End Date Taking? Authorizing Provider  cholecalciferol (VITAMIN D) 1000 UNITS tablet Take 1,000 Units by mouth daily.   Yes [provider]  VITAMIN K PO Take 1 tablet by mouth daily. Reported on 09/03/2015   Yes [provider]    Physical Exam: Vitals:   02/03/20 1008 02/03/20 1030 02/03/20 1100 02/03/20 1200  BP:  (!) 158/77 (!) 161/77 122/76  Pulse:  80 67 87  Resp:  18 16   SpO2:  98% 97%   Weight: 70.3 kg     Height: 5\' 8"  (1.727 m)       Constitutional: NAD, quite somnolent and difficult to arouse Vitals:   02/03/20 1008 02/03/20 1030 02/03/20 1100 02/03/20 1200  BP:  (!) 158/77 (!) 161/77 122/76  Pulse:  80 67 87  Resp:  18 16   SpO2:  98% 97%   Weight: 70.3 kg     Height: 5\' 8"  (1.727 m)      Eyes: lids and conjunctivae normal ENMT: Mucous membranes cannot be assessed. Neck: normal, supple Respiratory: clear to auscultation bilaterally. Normal respiratory effort. No accessory muscle use.  Cardiovascular: Regular rate and rhythm, no murmurs. No extremity edema. Abdomen: no distention. Bowel sounds positive.  Musculoskeletal: No edema to lower extremities noted. Skin: no rashes, lesions, ulcers.  Psychiatric: Cannot be assessed.  Labs on Admission: I have personally reviewed following labs and imaging studies  CBC: Recent Labs   Lab 02/03/20 1030  WBC 9.1  NEUTROABS 8.3*  HGB 13.4  HCT 40.0  MCV 88.7  PLT [id number]   Basic Metabolic Panel: Recent Labs  Lab 02/03/20 1030  NA 126*  K 3.4*  CL 90*  CO2 22  GLUCOSE 209*  BUN 17  CREATININE 0.51  CALCIUM 9.1   GFR: Estimated Creatinine Clearance: 71.7 mL/min (by C-G formula based on SCr of 0.51 mg/dL). Liver Function Tests: Recent Labs  Lab 02/03/20 1030  AST 26  ALT 26  ALKPHOS 70  BILITOT 0.9  PROT 7.2  ALBUMIN 4.4   No results for input(s): LIPASE, AMYLASE in the last 168 hours. No results for input(s): AMMONIA in the last 168 hours. Coagulation Profile: Recent Labs  Lab 02/03/20 1030  INR 1.0   Cardiac Enzymes: No results for input(s): CKTOTAL, CKMB, CKMBINDEX, TROPONINI in the last 168 hours. BNP (last 3 results) No results for input(s): PROBNP in the last 8760 hours. HbA1C: No results for input(s): HGBA1C in the last 72 hours. CBG: Recent Labs  Lab 02/03/20 1111  GLUCAP 188*   Lipid Profile: No results for input(s): CHOL, HDL, LDLCALC, TRIG, CHOLHDL, LDLDIRECT in the last 72 hours. Thyroid Function Tests: No results for input(s): TSH, T4TOTAL, FREET4, T3FREE, THYROIDAB in the last 72 hours. Anemia Panel: No results for input(s): VITAMINB12, FOLATE, FERRITIN, TIBC, IRON, RETICCTPCT in the last 72 hours. Urine analysis: No results found for: COLORURINE, APPEARANCEUR, LABSPEC, [place], GLUCOSEU, HGBUR, BILIRUBINUR, KETONESUR, PROTEINUR, UROBILINOGEN, NITRITE, LEUKOCYTESUR  Radiological Exams on Admission: DG Chest Port 1 View  Result Date: 02/03/2020 CLINICAL DATA:  Altered mental status EXAM: PORTABLE CHEST 1 VIEW COMPARISON:  Partial comparison to limited CT chest dated 03/08/2012 FINDINGS: Mild increased interstitial markings. Left lower lobe opacity, likely atelectasis. No pleural effusion or pneumothorax. The heart is normal in size. Old right 3rd and 4th rib fracture deformities. IMPRESSION: Mild left lower lobe opacity,  likely atelectasis. Electronically Signed   By: Julian Hy M.D.   On: 02/03/2020 11:41   CT HEAD CODE STROKE WO CONTRAST  Addendum Date: 02/03/2020   ADDENDUM REPORT: 02/03/2020 10:18 ADDENDUM: This study was inadvertently signed before the report was completed, please disregard the above. Interhemispheric mass centered at the genu of the corpus callosum, fornices, and septum pellucidum with peripheral isodensity and central low-density. Ill-defined low-density seen in the bifrontal white matter. Findings are primarily concerning for aggressive glioma or lymphoma. There is mass effect on the foramen Monro with mild lateral ventriculomegaly but no herniation or shift. No acute hemorrhage. Case discussed with Dr. Rogene Houston Electronically Signed   By: Monte Fantasia M.D.   On: 02/03/2020 10:18   Result Date: 02/03/2020 CLINICAL DATA:  Code stroke. EXAM: CT HEAD WITHOUT CONTRAST TECHNIQUE: Contiguous axial images were obtained from the base of the skull through the vertex without intravenous contrast. COMPARISON:  None. FINDINGS:  Brain: Masslike appearance at the anterior interhemispheric fissure along the fornices, genu of the corpus callosum, and septum pellucidum. The size of peripheral isodense appearance and central low-density. Vascular: Skull: Sinuses/Orbits: Other: These results were called by telephone at the time of interpretation on 02/03/2020 at 10:10 am to provider Fredia Sorrow , who verbally acknowledged these results. ASPECTS [facility] Stroke Program Early CT Score) - Ganglionic level infarction (caudate, lentiform nuclei, internal capsule, insula, M1-M3 cortex): - Supraganglionic infarction (M4-M6 cortex): Total score (0-10 with 10 being normal): IMPRESSION: 1. 2. ASPECTS is Electronically Signed: By: Monte Fantasia M.D. On: 02/03/2020 10:12    EKG: Sinus rhythm 67 bpm.  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 Mickeal Skinner with neuro-oncology by 5/24 -Continue dexamethasone 4 mg IV every 6 hours -MRI with and without contrast for further evaluation  Mild hyponatremia -Normal saline IV -Recheck a.m. labs -Check urine and serum osmolarity as well as TSH  Mild hypokalemia -Replete with IV and reevaluate in a.m.  Hyperglycemia -No reported history of diabetes noted -Plan to maintain on low-dose SSI for now with every 4 hour Accu-Cheks while n.p.o. -Check hemoglobin A1c  History of vitamin D deficiency -Hold current supplementation   DVT prophylaxis: SCDs Code Status: Full Family Communication: Discussed with mother Zurri Obryan on phone Disposition Plan:Transfer to [facility] for Neurosurgery and Neurology evaluation Consults called:Neurosurgery Dr. Christella Noa by EDP and Neurology Dr. Rory Percy by self Admission status: Inpatient, Progressive Care Status is: Inpatient  Remains inpatient appropriate because:Altered mental status, Ongoing diagnostic testing needed not appropriate for outpatient work up and Unsafe d/c plan   Dispo: The patient is from: Home              Anticipated d/c is to: Home              Anticipated d/c date is: 3 days              Patient currently is not medically stable to d/c.   Evellyn Tuff D Manuella Ghazi DO [facility]  If 7PM-7AM, please contact night-coverage [email or web address]  02/03/2020, 1:41 PM

## 2020-02-03 NOTE — ED Notes (Signed)
CODE STROKE paged out. CT called

## 2020-02-03 NOTE — ED Notes (Signed)
Spoke with [facility] to set up transportation at this time.

## 2020-02-03 NOTE — ED Triage Notes (Signed)
Per mother patient was fine yesterday before bed other than headache. Mother states she found patient in bathroom at 0300 vomiting and c/o migraine with floaters. Patient slightly confused then but became severely confused prior to mother calling EMS at 0800.

## 2020-02-03 NOTE — Progress Notes (Signed)
Went bedside per request to check on patient after transfer from [facility]. Reviewed charting. Spoke with patient's brother and mother at bedside. Updated them on plan and answered questions that I could from chart review. No further questions at this time and they plan to go home tonight and return in the morning. They are eager to speak with Dr. Mickeal Skinner. They would like updates about anything major that happens overnight. Spoke with patient's RN Davy Pique. No further needs at this time.    Barrington Ellison, MD [facility]

## 2020-02-03 NOTE — Progress Notes (Signed)
CODE STROKE 09:51 CALL TIME 09:55 EXAM STARTED 10;00 EXAM FINISHED 10:00 IMAGES sent to [facility] 10:02 EXAM completed in [facility] Radiology called spoke with TRY/Roy  Unable to do angio head and neck due to patient not being still.

## 2020-02-03 NOTE — Progress Notes (Addendum)
Received from [place] by [facility] ambulance service. Was very combative and anxious and attempting to ht, get up out of the bed and pull and bite at IV line.  Thrashing around in the bed.  Not following commands and not verbal, which is what her mom said was the way she was all day and at home just prior to coming to hospital".  Already had on mittens and this nurse paged MD to notify that she is now here and that she does need something to calm her down, as nothing is working.  He did order soft wrist restraints and also a dose of Haldol 2mg  IV.

## 2020-02-03 NOTE — ED Triage Notes (Addendum)
EMS reports pt c/o migraine headache yesterday that was getting worse throughout the day.  Reports pt woke up around 0440 and friend told ems pt was normal until approx 0500.  Reports pt became confused.  Moves all extremities and has been combative with ems.  CBG 223 and vss. Pt 92% on room air with ems, ems placed pt on o2 at 2liters and sat 96%.

## 2020-02-03 NOTE — ED Triage Notes (Addendum)
Patient brought in via EMS. Alert, combative. Patient c/o migraine yesterday but became confused and unable to follow commands today at 8am. Dr Rogene Houston assessed patient and called Code Stroke at [phone]. Patient being transported on EMS stretcher to CT.

## 2020-02-03 NOTE — Consult Note (Signed)
NEURO HOSPITALIST CONSULT NOTE   Requestig physician: Dr. Manuella Ghazi  Reason for Consult: Corpus callosum mass-like lesion on CT  History obtained from:  Chart    HPI:                                                                                                                                          Alicia Mcdonald is an 64 y.o. female with a PMHx of cancer, iron deficiency, vitamin D deficiency and migraine headache who initially presented to the [facility] ED with a chief complaint of a migraine-like headache since Saturday that had been getting worse throughout the day. She had woken up in the early AM today and at 0300 her mother found the patient in the bathroom vomiting, continuing to complain of a migraine as well as seeing floaters. She was slightly confused but then became severely confused prior to her mother calling EMS at 0800. On EMS arrival she was noted to be severely confused, combative, but with stable vitals, with a CBG of 223 and satting 92% on RA.   A Code Stroke was called in the [facility] ED at [phone]. CT head revealed a hypodensity within the genu of the corpus callosum with irregular margins and surrounding mass effect concerning for glioma. Teleneurology felt that she may have had a seizure. She was administered Ativan and loaded with 1500 mg Keppra and Dexamethasone 10 mg was also administered. She was then transferred to [facility] for MRI brain, EEG and consultations with Neurosurgery, Neurology and Oncology.   She cannot currently provide a ROS due to somnolence. Per chart, mother denied the following symptoms: fevers, chills, CP, SOB, cough, abdominal pain, diarrhea, focal weakness or numbness or vision changes.    Past Medical History:  Diagnosis Date  . Cancer ([place])   . Iron deficiency   . Migraine   . Vitamin D deficiency     Past Surgical History:  Procedure Laterality Date  . COLONOSCOPY N/A 04/24/2013   Procedure: COLONOSCOPY;  Surgeon: Danie Binder, MD;   Location: [facility];  Service: Endoscopy;  Laterality: N/A;  1:45  . MELANOMA EXCISION  1977    Family History  Problem Relation Age of Onset  . Colon cancer Neg Hx               Social History:  reports that she has never smoked. She has never used smokeless tobacco. She reports current alcohol use. She reports that she does not use drugs.  No Known Allergies  MEDICATIONS:  Prior to Admission:  Medications Prior to Admission  Medication Sig Dispense Refill Last Dose  . cholecalciferol (VITAMIN D) 1000 UNITS tablet Take 1,000 Units by mouth daily.     Marland Kitchen VITAMIN K PO Take 1 tablet by mouth daily. Reported on 09/03/2015      Scheduled: . [START ON 02/04/2020] dexamethasone (DECADRON) injection  4 mg Intravenous Q6H  . insulin aspart  0-9 Units Subcutaneous Q4H  Keppra 500 mg IV BID  Continuous: . sodium chloride 500 mL (02/03/20 1101)  . [START ON 02/04/2020] levETIRAcetam       ROS:                                                                                                                                       As per HPI.    Blood pressure (!) 113/56, pulse 70, temperature 98.7 F (37.1 C), resp. rate 14, height 5\' 8"  (1.727 m), weight 70.3 kg, SpO2 94 %.   General Examination:                                                                                                       Physical Exam  HEENT-  [place]/AT   Lungs- Respirations unlabored Extremities- No edema  Neurological Examination Mental Status: Somnolent to obtunded. Sparse, slurred speech which is intelligible but consistent with disorientation. Answers "Friday" when asked the day of the week. Unable to identify the year and states that she is "at home". Requires repetition when given commands and only able to demonstrate comprehension of simple motor commands.  Cranial Nerves: II: Will briefly  track and fixate on examiner's face. Pupils 4 mm and sluggishly reactive bilaterally.   III,IV, VI: Right ptosis noted. Eyes are conjugate and can track hesitantly to left and right of midline, but will not track fully in either horizontal direction. No nystagmus.  V,VII: No facial droop. Unable to formally test sensation.  VIII: hearing intact to voice IX,X: Hypophonic speech XI: Head is midline XII: Unable to assess Motor: Will move BUE and BLE to repeated commands. Unable to formally rate strength, but no asymmetry noted.  Sensory: Unable to formally assess. Will move the extremity that is touched x 4.  Deep Tendon Reflexes: 1 bilateral brachioradialis and patellae.  Cerebellar/Gait: Unable to assess.   Lab Results: Basic Metabolic Panel: Recent Labs  Lab 02/03/20 1030  NA 126*  K 3.4*  CL 90*  CO2 22  GLUCOSE 209*  BUN 17  CREATININE 0.51  CALCIUM 9.1    CBC: Recent Labs  Lab 02/03/20 1030  WBC 9.1  NEUTROABS 8.3*  HGB 13.4  HCT 40.0  MCV 88.7  PLT 272    Cardiac Enzymes: No results for input(s): CKTOTAL, CKMB, CKMBINDEX, TROPONINI in the last 168 hours.  Lipid Panel: No results for input(s): CHOL, TRIG, HDL, CHOLHDL, VLDL, LDLCALC in the last 168 hours.  Imaging: DG Chest Port 1 View  Result Date: 02/03/2020 CLINICAL DATA:  Altered mental status EXAM: PORTABLE CHEST 1 VIEW COMPARISON:  Partial comparison to limited CT chest dated 03/08/2012 FINDINGS: Mild increased interstitial markings. Left lower lobe opacity, likely atelectasis. No pleural effusion or pneumothorax. The heart is normal in size. Old right 3rd and 4th rib fracture deformities. IMPRESSION: Mild left lower lobe opacity, likely atelectasis. Electronically Signed   By: Julian Hy M.D.   On: 02/03/2020 11:41   CT HEAD CODE STROKE WO CONTRAST  Addendum Date: 02/03/2020   ADDENDUM REPORT: 02/03/2020 10:18 ADDENDUM: This study was inadvertently signed before the report was completed, please  disregard the above. Interhemispheric mass centered at the genu of the corpus callosum, fornices, and septum pellucidum with peripheral isodensity and central low-density. Ill-defined low-density seen in the bifrontal white matter. Findings are primarily concerning for aggressive glioma or lymphoma. There is mass effect on the foramen Monro with mild lateral ventriculomegaly but no herniation or shift. No acute hemorrhage. Case discussed with Dr. Rogene Houston Electronically Signed   By: Monte Fantasia M.D.   On: 02/03/2020 10:18   Result Date: 02/03/2020 CLINICAL DATA:  Code stroke. EXAM: CT HEAD WITHOUT CONTRAST TECHNIQUE: Contiguous axial images were obtained from the base of the skull through the vertex without intravenous contrast. COMPARISON:  None. FINDINGS: Brain: Masslike appearance at the anterior interhemispheric fissure along the fornices, genu of the corpus callosum, and septum pellucidum. The size of peripheral isodense appearance and central low-density. Vascular: Skull: Sinuses/Orbits: Other: These results were called by telephone at the time of interpretation on 02/03/2020 at 10:10 am to provider Fredia Sorrow , who verbally acknowledged these results. ASPECTS [facility] Stroke Program Early CT Score) - Ganglionic level infarction (caudate, lentiform nuclei, internal capsule, insula, M1-M3 cortex): - Supraganglionic infarction (M4-M6 cortex): Total score (0-10 with 10 being normal): IMPRESSION: 1. 2. ASPECTS is Electronically Signed: By: Monte Fantasia M.D. On: 02/03/2020 10:12    Assessment: 64 year old female presenting with nausea, vomiting, headache and encephalopathy. CT reveals corpus callosum mass lesion 1. Mass lesion most likely represents a glioma or CNS lymphoma. Location would be atypical for stroke. Tumefactive demyelination is also a differential diagnostic consideration.  2. Possible postictal state from unwitnessed seizure.  3. Hyponatremia.   Recommendations: 1. Consult Dr.  Mickeal Skinner in the AM.  2. MRI brain with and without contrast.  3. EEG.  4. Neurosurgery consult.  5. Continue Keppra at 500 mg IV BID. Patient does not appear able to take PO.  6. Continue Dexamethasone at 4 mg IV q6h.   Electronically signed: Dr. Kerney Elbe 02/03/2020, 10:23 PM

## 2020-02-04 ENCOUNTER — Inpatient Hospital Stay (HOSPITAL_COMMUNITY): Payer: BC Managed Care – PPO

## 2020-02-04 DIAGNOSIS — R220 Localized swelling, mass and lump, head: Secondary | ICD-10-CM

## 2020-02-04 DIAGNOSIS — C719 Malignant neoplasm of brain, unspecified: Secondary | ICD-10-CM

## 2020-02-04 DIAGNOSIS — R4182 Altered mental status, unspecified: Secondary | ICD-10-CM

## 2020-02-04 LAB — COMPREHENSIVE METABOLIC PANEL
ALT: 23 U/L (ref 0–44)
AST: 21 U/L (ref 15–41)
Albumin: 3.3 g/dL — ABNORMAL LOW (ref 3.5–5.0)
Alkaline Phosphatase: 57 U/L (ref 38–126)
Anion gap: 7 (ref 5–15)
BUN: 12 mg/dL (ref 8–23)
CO2: 26 mmol/L (ref 22–32)
Calcium: 9.1 mg/dL (ref 8.9–10.3)
Chloride: 104 mmol/L (ref 98–111)
Creatinine, Ser: 0.62 mg/dL (ref 0.44–1.00)
GFR calc Af Amer: >60 mL/min (ref >60)
GFR calc non Af Amer: >60 mL/min (ref >60)
Glucose, Bld: 156 mg/dL — ABNORMAL HIGH (ref 70–99)
Potassium: 3.8 mmol/L (ref 3.5–5.1)
Sodium: 137 mmol/L (ref 135–145)
Total Bilirubin: 0.8 mg/dL (ref 0.3–1.2)
Total Protein: 6 g/dL — ABNORMAL LOW (ref 6.5–8.1)

## 2020-02-04 LAB — CBC
HCT: 38.1 % (ref 36.0–46.0)
Hemoglobin: 12.7 g/dL (ref 12.0–15.0)
MCH: 29.8 pg (ref 26.0–34.0)
MCHC: 33.3 g/dL (ref 30.0–36.0)
MCV: 89.4 fL (ref 80.0–100.0)
Platelets: 220 10*3/uL (ref 150–400)
RBC: 4.26 MIL/uL (ref 3.87–5.11)
RDW: 13 % (ref 11.5–15.5)
WBC: 7.8 10*3/uL (ref 4.0–10.5)
nRBC: 0 % (ref 0.0–0.2)

## 2020-02-04 LAB — GLUCOSE, CAPILLARY
Glucose-Capillary: 109 mg/dL — ABNORMAL HIGH (ref 70–99)
Glucose-Capillary: 109 mg/dL — ABNORMAL HIGH (ref 70–99)
Glucose-Capillary: 115 mg/dL — ABNORMAL HIGH (ref 70–99)
Glucose-Capillary: 128 mg/dL — ABNORMAL HIGH (ref 70–99)
Glucose-Capillary: 136 mg/dL — ABNORMAL HIGH (ref 70–99)
Glucose-Capillary: 152 mg/dL — ABNORMAL HIGH (ref 70–99)

## 2020-02-04 LAB — OSMOLALITY, URINE: Osmolality, Ur: 580 mOsm/kg (ref 300–900)

## 2020-02-04 MED ORDER — GADOBUTROL 1 MMOL/ML IV SOLN
7.5000 mL | Freq: Once | INTRAVENOUS | Status: AC | PRN
  Administered 2020-02-04: 7.5 mL via INTRAVENOUS

## 2020-02-04 MED ORDER — IOHEXOL 350 MG/ML SOLN
115.0000 mL | Freq: Once | INTRAVENOUS | Status: AC | PRN
  Administered 2020-02-04: 115 mL via INTRAVENOUS

## 2020-02-04 MED ORDER — DEXAMETHASONE SODIUM PHOSPHATE 4 MG/ML IJ SOLN
4.0000 mg | Freq: Four times a day (QID) | INTRAMUSCULAR | Status: DC
  Administered 2020-02-04: 4 mg via INTRAVENOUS
  Filled 2020-02-04: qty 1

## 2020-02-04 NOTE — Progress Notes (Signed)
Neurosurgery  I was contacted by my partner Dr. Saintclair Halsted today regarding  This 64 yo F who presented with ~2 weeks of increasing confusion, headaches, nausea who was found to have a mass-like lesion in the genu of the corpus callosum.  MRI suggests infiltrative process such as GBM or lymphoma, less likely tumefactive MS.  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  I saw and examined her today.  Unless systemic workup is fruitful, I am planning to perform stereotactic biopsy Wednesday ~noon, and will likely also perform endoscopic septostomy and external ventricular drain placement if she has continued depressed level of alertness or clinical symptoms of hydrocephalus.  Due to concern for lymphoma, holding dexamethasone for now may increase diagnostic yield of biopsy.  I discussed with the patient's family the treatment plan.

## 2020-02-04 NOTE — Progress Notes (Signed)
Overnight, patient was admitted to the unit oriented to self. However, patient became alert and oriented X 3 by morning. Patient is oriented to self, month, and  place, stating she is "at the hospital". Patient is following commands and able to answer questions. Patient is voiding and Vital Signs are stable.

## 2020-02-04 NOTE — Consult Note (Signed)
Reason for Consult: Interhemispheric brain mass Referring Physician: Emergency department and neurology  Alicia Mcdonald is an 64 y.o. female.  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  Patient presented extremely encephalopathic.  Patient is initially seen in the [facility], ER initial CT scan showed the mass and patient was transferred here for continued work-up.  Past Medical History:  Diagnosis Date  . Cancer ([place])   . Iron deficiency   . Migraine   . Vitamin D deficiency     Past Surgical History:  Procedure Laterality Date  . COLONOSCOPY N/A 04/24/2013   Procedure: COLONOSCOPY;  Surgeon: Danie Binder, MD;  Location: [facility];  Service: Endoscopy;  Laterality: N/A;  1:45  . MELANOMA EXCISION  1977    Family History  Problem Relation Age of Onset  . Colon cancer Neg Hx     Social History:  reports that she has never smoked. She has never used smokeless tobacco. She reports current alcohol use. She reports that she does not use drugs.  Allergies: No Known Allergies  Medications: I have reviewed the patient's current medications.  Results for orders placed or performed during the hospital encounter of 02/03/20 (from the past 48 hour(s))  SARS Coronavirus 2 by RT PCR (hospital order, performed in [facility] hospital lab) Nasopharyngeal Nasopharyngeal Swab     Status: None   Collection Time: 02/03/20 10:29 AM   Specimen: Nasopharyngeal Swab  Result Value Ref Range   SARS Coronavirus 2 NEGATIVE NEGATIVE    Comment: (NOTE) SARS-CoV-2 target nucleic acids are NOT DETECTED. The SARS-CoV-2 RNA is generally detectable in upper and lower respiratory specimens during the acute phase of infection. The lowest concentration of SARS-CoV-2 viral copies this assay can detect is 250 copies / mL. A negative result does not preclude SARS-CoV-2 infection and should not be used as the sole basis for  treatment or other patient management decisions.  A negative result may occur with improper specimen collection / handling, submission of specimen other than nasopharyngeal swab, presence of viral mutation(s) within the areas targeted by this assay, and inadequate number of viral copies (<250 copies / mL). A negative result must be combined with clinical observations, patient history, and epidemiological information. Fact Sheet for Patients:   StrictlyIdeas.no Fact Sheet for Healthcare Providers: BankingDealers.co.za This test is not yet approved or cleared  by the [place] FDA and has been authorized for detection and/or diagnosis of SARS-CoV-2 by FDA under an Emergency Use Authorization (EUA).  This EUA will remain in effect (meaning this test can be used) for the duration of the COVID-19 declaration under Section 564(b)(1) of the Act, 21 U.S.C. section 360bbb-3(b)(1), unless the authorization is terminated or revoked sooner. Performed at [facility], [address], [place], [place] [address]   Protime-INR     Status: None   Collection Time: 02/03/20 10:30 AM  Result Value Ref Range   Prothrombin Time 12.7 11.4 - 15.2 seconds   INR 1.0 0.8 - 1.2    Comment: (NOTE) INR goal varies based on device and disease states. Performed at [facility], [address], [place], [place] [address]   APTT     Status: None   Collection Time: 02/03/20 10:30 AM  Result Value Ref Range   aPTT 27 24 - 36 seconds    Comment: Performed at [facility], [address] [place], [place] [address]  CBC     Status:  None   Collection Time: 02/03/20 10:30 AM  Result Value Ref Range   WBC 9.1 4.0 - 10.5 K/uL   RBC 4.51 3.87 - 5.11 MIL/uL   Hemoglobin 13.4 12.0 - 15.0 g/dL   HCT 40.0 36.0 - 46.0 %   MCV 88.7 80.0 - 100.0 fL   MCH 29.7 26.0 - 34.0 pg   MCHC 33.5 30.0 - 36.0 g/dL   RDW 12.5 11.5 - 15.5 %   Platelets 272 150 - 400 K/uL   nRBC 0.0 0.0 -  0.2 %    Comment: Performed at [facility], [address], [place], [place] [address]  Differential     Status: Abnormal   Collection Time: 02/03/20 10:30 AM  Result Value Ref Range   Neutrophils Relative % 91 %   Neutro Abs 8.3 (H) 1.7 - 7.7 K/uL   Lymphocytes Relative 6 %   Lymphs Abs 0.5 (L) 0.7 - 4.0 K/uL   Monocytes Relative 3 %   Monocytes Absolute 0.3 0.1 - 1.0 K/uL   Eosinophils Relative 0 %   Eosinophils Absolute 0.0 0.0 - 0.5 K/uL   Basophils Relative 0 %   Basophils Absolute 0.0 0.0 - 0.1 K/uL   Immature Granulocytes 0 %   Abs Immature Granulocytes 0.03 0.00 - 0.07 K/uL    Comment: Performed at [facility], [address], [place], [place] [address]  Comprehensive metabolic panel     Status: Abnormal   Collection Time: 02/03/20 10:30 AM  Result Value Ref Range   Sodium 126 (L) 135 - 145 mmol/L   Potassium 3.4 (L) 3.5 - 5.1 mmol/L   Chloride 90 (L) 98 - 111 mmol/L   CO2 22 22 - 32 mmol/L   Glucose, Bld 209 (H) 70 - 99 mg/dL    Comment: Glucose reference range applies only to samples taken after fasting for at least 8 hours.   BUN 17 8 - 23 mg/dL   Creatinine, Ser 0.51 0.44 - 1.00 mg/dL   Calcium 9.1 8.9 - 10.3 mg/dL   Total Protein 7.2 6.5 - 8.1 g/dL   Albumin 4.4 3.5 - 5.0 g/dL   AST 26 15 - 41 U/L   ALT 26 0 - 44 U/L   Alkaline Phosphatase 70 38 - 126 U/L   Total Bilirubin 0.9 0.3 - 1.2 mg/dL   GFR calc non Af Amer >60 >60 mL/min   GFR calc Af Amer >60 >60 mL/min   Anion gap 14 5 - 15    Comment: Performed at [facility], [address], [place], [place] [address]  Ethanol     Status: None   Collection Time: 02/03/20 10:30 AM  Result Value Ref Range   Alcohol, Ethyl (B) <10 <10 mg/dL    Comment: (NOTE) Lowest detectable limit for serum alcohol is 10 mg/dL. For medical purposes only. Performed at [facility], [address] [place], [place] [address]   CBG monitoring, ED     Status: Abnormal   Collection Time: 02/03/20 11:11 AM  Result Value Ref Range    Glucose-Capillary 188 (H) 70 - 99 mg/dL    Comment: Glucose reference range applies only to samples taken after fasting for at least 8 hours.  Osmolality     Status: None   Collection Time: 02/03/20  3:10 PM  Result Value Ref Range   Osmolality 276 275 - 295 mOsm/kg    Comment: Performed at [facility], [place] [address], [place],  [address]  TSH  Status: None   Collection Time: 02/03/20  3:10 PM  Result Value Ref Range   TSH 0.646 0.350 - 4.500 uIU/mL    Comment: Performed by a 3rd Generation assay with a functional sensitivity of <=0.01 uIU/mL. Performed at [facility], [address] [place], [place] [address]   HIV Antibody (routine testing w rflx)     Status: None   Collection Time: 02/03/20  3:10 PM  Result Value Ref Range   HIV Screen 4th Generation wRfx Non Reactive Non Reactive    Comment: Performed at [facility], [place] [address], [place], [place] [address]  Hemoglobin A1c     Status: Abnormal   Collection Time: 02/03/20  3:10 PM  Result Value Ref Range   Hgb A1c MFr Bld 6.8 (H) 4.8 - 5.6 %    Comment: (NOTE) Pre diabetes:          5.7%-6.4% Diabetes:              >6.4% Glycemic control for   <7.0% adults with diabetes    Mean Plasma Glucose 148.46 mg/dL    Comment: Performed at [facility] [address] [place], [place] [address]  CBG monitoring, ED     Status: Abnormal   Collection Time: 02/03/20  3:57 PM  Result Value Ref Range   Glucose-Capillary 168 (H) 70 - 99 mg/dL    Comment: Glucose reference range applies only to samples taken after fasting for at least 8 hours.  Urine rapid drug screen (hosp performed)     Status: None   Collection Time: 02/03/20  4:40 PM  Result Value Ref Range   Opiates NONE DETECTED NONE DETECTED   Cocaine NONE DETECTED NONE DETECTED   Benzodiazepines NONE DETECTED NONE DETECTED   Amphetamines NONE DETECTED NONE DETECTED   Tetrahydrocannabinol NONE DETECTED NONE DETECTED   Barbiturates NONE DETECTED  NONE DETECTED    Comment: (NOTE) DRUG SCREEN FOR MEDICAL PURPOSES ONLY.  IF CONFIRMATION IS NEEDED FOR ANY PURPOSE, NOTIFY LAB WITHIN 5 DAYS. LOWEST DETECTABLE LIMITS FOR URINE DRUG SCREEN Drug Class                     Cutoff (ng/mL) Amphetamine and metabolites    1000 Barbiturate and metabolites    200 Benzodiazepine                 [id number] Tricyclics and metabolites     300 Opiates and metabolites        300 Cocaine and metabolites        300 THC                            50 Performed at [facility], [address], [place], [place] [address]   Urinalysis, Routine w reflex microscopic     Status: Abnormal   Collection Time: 02/03/20  4:40 PM  Result Value Ref Range   Color, Urine YELLOW YELLOW   APPearance CLEAR CLEAR   Specific Gravity, Urine 1.016 1.005 - 1.030   pH 7.0 5.0 - 8.0   Glucose, UA 150 (A) NEGATIVE mg/dL   Hgb urine dipstick NEGATIVE NEGATIVE   Bilirubin Urine NEGATIVE NEGATIVE   Ketones, ur 20 (A) NEGATIVE mg/dL   Protein, ur NEGATIVE NEGATIVE mg/dL   Nitrite NEGATIVE NEGATIVE   Leukocytes,Ua NEGATIVE NEGATIVE    Comment: Performed at [facility], [address], [place], [place] [address]  Osmolality, urine  Status: None   Collection Time: 02/03/20  4:40 PM  Result Value Ref Range   Osmolality, Ur 580 300 - 900 mOsm/kg    Comment: Performed at [facility] [address] [place], [place] [address]  Glucose, capillary     Status: Abnormal   Collection Time: 02/03/20  9:37 PM  Result Value Ref Range   Glucose-Capillary 123 (H) 70 - 99 mg/dL    Comment: Glucose reference range applies only to samples taken after fasting for at least 8 hours.  Glucose, capillary     Status: Abnormal   Collection Time: 02/03/20 11:44 PM  Result Value Ref Range   Glucose-Capillary 137 (H) 70 - 99 mg/dL    Comment: Glucose reference range applies only to samples taken after fasting for at least 8 hours.  Glucose, capillary     Status: Abnormal   Collection Time:  02/04/20  3:48 AM  Result Value Ref Range   Glucose-Capillary 136 (H) 70 - 99 mg/dL    Comment: Glucose reference range applies only to samples taken after fasting for at least 8 hours.  Glucose, capillary     Status: Abnormal   Collection Time: 02/04/20  9:28 AM  Result Value Ref Range   Glucose-Capillary 152 (H) 70 - 99 mg/dL    Comment: Glucose reference range applies only to samples taken after fasting for at least 8 hours.    CT Code Stroke CTA Head W/WO contrast  Result Date: 02/04/2020 CLINICAL DATA:  Follow-up examination for acute encephalopathy. EXAM: CT ANGIOGRAPHY HEAD AND NECK CT PERFUSION BRAIN TECHNIQUE: Multidetector CT imaging of the head and neck was performed using the standard protocol during bolus administration of intravenous contrast. Multiplanar CT image reconstructions and MIPs were obtained to evaluate the vascular anatomy. Carotid stenosis measurements (when applicable) are obtained utilizing NASCET criteria, using the distal internal carotid diameter as the denominator. Multiphase CT imaging of the brain was performed following IV bolus contrast injection. Subsequent parametric perfusion maps were calculated using RAPID software. CONTRAST:  168mL OMNIPAQUE IOHEXOL 350 MG/ML SOLN COMPARISON:  Prior head CT from 02/03/2020 as well as brain MRI from earlier the same day. FINDINGS: CTA NECK FINDINGS Aortic arch: Visualized aortic arch of normal caliber with normal branch pattern. Minimal plaque within the arch itself. No hemodynamically significant stenosis about the origin of the great vessels. Visualized subclavian arteries widely patent. Right carotid system: Right common carotid artery widely patent from its origin to the bifurcation without stenosis. Minimal centric calcified plaque at the right bifurcation without significant narrowing. Right ICA widely patent distally to the skull base without stenosis, dissection or occlusion. Left carotid system: Left common and internal  carotid arteries widely patent without stenosis, dissection or occlusion. Vertebral arteries: Both vertebral arteries arise from the subclavian arteries. Vertebral arteries widely patent without stenosis, dissection or occlusion. Skeleton: No acute osseous abnormality. No discrete or worrisome osseous lesions. Moderate cervical spondylosis at C5-6 without high-grade stenosis. Focal sclerosis within the T4 vertebral body noted, nonspecific, but most likely benign. Other neck: No other acute soft tissue abnormality within the neck. No mass lesion or adenopathy. Upper chest: Scattered atelectatic changes noted within the visualized lungs. Visualized upper chest demonstrates no other acute finding. Review of the MIP images confirms the above findings CTA HEAD FINDINGS Anterior circulation: Internal carotid arteries patent to the termini without stenosis or other abnormality. A1 segments patent. Normal anterior communicator complex. Anterior cerebral arteries patent to their distal aspects without stenosis. No M1 stenosis  or occlusion. Normal MCA bifurcations. Distal MCA branches well perfused and symmetric. Posterior circulation: Both vertebral arteries widely patent to the vertebrobasilar junction. Both picas patent. Basilar widely patent to its distal aspect without stenosis. Superior cerebellar and posterior cerebral arteries widely patent bilaterally. Venous sinuses: Grossly patent allowing for timing the contrast bolus. Anatomic variants: None significant. Review of the MIP images confirms the above findings CT Brain Perfusion Findings: ASPECTS: Not applicable. CBF (<30%) Volume: 49mL Perfusion (Tmax>6.0s) volume: 39mL Mismatch Volume: 20mL Infarction Location:Negative CT perfusion. IMPRESSION: 1. Normal CTA of the head and neck. No large vessel occlusion, hemodynamically significant stenosis, or other acute vascular abnormality. 2. Negative CT perfusion. Electronically Signed   By: Jeannine Boga M.D.   On:  02/04/2020 03:22   CT Code Stroke CTA Neck W/WO contrast  Result Date: 02/04/2020 CLINICAL DATA:  Follow-up examination for acute encephalopathy. EXAM: CT ANGIOGRAPHY HEAD AND NECK CT PERFUSION BRAIN TECHNIQUE: Multidetector CT imaging of the head and neck was performed using the standard protocol during bolus administration of intravenous contrast. Multiplanar CT image reconstructions and MIPs were obtained to evaluate the vascular anatomy. Carotid stenosis measurements (when applicable) are obtained utilizing NASCET criteria, using the distal internal carotid diameter as the denominator. Multiphase CT imaging of the brain was performed following IV bolus contrast injection. Subsequent parametric perfusion maps were calculated using RAPID software. CONTRAST:  169mL OMNIPAQUE IOHEXOL 350 MG/ML SOLN COMPARISON:  Prior head CT from 02/03/2020 as well as brain MRI from earlier the same day. FINDINGS: CTA NECK FINDINGS Aortic arch: Visualized aortic arch of normal caliber with normal branch pattern. Minimal plaque within the arch itself. No hemodynamically significant stenosis about the origin of the great vessels. Visualized subclavian arteries widely patent. Right carotid system: Right common carotid artery widely patent from its origin to the bifurcation without stenosis. Minimal centric calcified plaque at the right bifurcation without significant narrowing. Right ICA widely patent distally to the skull base without stenosis, dissection or occlusion. Left carotid system: Left common and internal carotid arteries widely patent without stenosis, dissection or occlusion. Vertebral arteries: Both vertebral arteries arise from the subclavian arteries. Vertebral arteries widely patent without stenosis, dissection or occlusion. Skeleton: No acute osseous abnormality. No discrete or worrisome osseous lesions. Moderate cervical spondylosis at C5-6 without high-grade stenosis. Focal sclerosis within the T4 vertebral body  noted, nonspecific, but most likely benign. Other neck: No other acute soft tissue abnormality within the neck. No mass lesion or adenopathy. Upper chest: Scattered atelectatic changes noted within the visualized lungs. Visualized upper chest demonstrates no other acute finding. Review of the MIP images confirms the above findings CTA HEAD FINDINGS Anterior circulation: Internal carotid arteries patent to the termini without stenosis or other abnormality. A1 segments patent. Normal anterior communicator complex. Anterior cerebral arteries patent to their distal aspects without stenosis. No M1 stenosis or occlusion. Normal MCA bifurcations. Distal MCA branches well perfused and symmetric. Posterior circulation: Both vertebral arteries widely patent to the vertebrobasilar junction. Both picas patent. Basilar widely patent to its distal aspect without stenosis. Superior cerebellar and posterior cerebral arteries widely patent bilaterally. Venous sinuses: Grossly patent allowing for timing the contrast bolus. Anatomic variants: None significant. Review of the MIP images confirms the above findings CT Brain Perfusion Findings: ASPECTS: Not applicable. CBF (<30%) Volume: 31mL Perfusion (Tmax>6.0s) volume: 64mL Mismatch Volume: 36mL Infarction Location:Negative CT perfusion. IMPRESSION: 1. Normal CTA of the head and neck. No large vessel occlusion, hemodynamically significant stenosis, or other acute vascular abnormality. 2. Negative CT  perfusion. Electronically Signed   By: Jeannine Boga M.D.   On: 02/04/2020 03:22   MR BRAIN W WO CONTRAST  Result Date: 02/04/2020 CLINICAL DATA:  Initial evaluation for acute encephalopathy, migraine, mass on prior head CT. EXAM: MRI HEAD WITHOUT AND WITH CONTRAST TECHNIQUE: Multiplanar, multiecho pulse sequences of the brain and surrounding structures were obtained without and with intravenous contrast. CONTRAST:  7.44mL GADAVIST GADOBUTROL 1 MMOL/ML IV SOLN COMPARISON:  Prior head  CT from 02/03/2020. FINDINGS: Brain: There is an infiltrative mass centered at the anterior inter hemispheric fissure, with extension along the fornices, septum pellucidum, and anterior genu of the corpus callosum. Lesion demonstrates heterogeneous T2/FLAIR signal intensity with fairly avid heterogeneous postcontrast enhancement. Exact measurements of this lesion difficult given location and infiltrative nature. Mild central restricted diffusion related to necrosis and/or hypercellularity. Extension into the left greater than right periventricular white matter. The adjacent frontal horns are somewhat compressed and irregular by the adjacent tumor. Mass effect on the adjacent foramen of Monro with associated lateral ventriculomegaly. No transependymal flow of CSF or herniation. No distant subarachnoid seeding. Trace right-to-left shift at the septum pellucidum. No other mass lesion or abnormal enhancement. No evidence for acute or subacute infarct. Gray-white matter differentiation maintained. No encephalomalacia to suggest chronic cortical infarction. No other evidence for acute or chronic intracranial hemorrhage. No made of a partially empty sella. Note made of a 1.8 cm arachnoid cyst at the left middle cranial fossa. Vascular: Major intracranial vascular flow voids are maintained. Skull and upper cervical spine: Craniocervical junction within normal limits. Upper cervical spine normal. Bone marrow signal intensity within normal limits. No focal marrow replacing lesion. No scalp soft tissue abnormality. Sinuses/Orbits: Globes and orbital soft tissues within normal limits are otherwise clear. No significant mastoid effusion. Inner ear structures grossly normal. Noted within the ethmoidal air cells. Paranasal sinuses Other: None. IMPRESSION: 1. Interhemispheric mass centered at the genu of the corpus callosum, septum pellucidum, and fornices, with extension into the left greater than right periventricular white  matter. 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 Lymphoma would be the primary differential consideration. 2. Secondary mass effect on the foramen of Monro with lateral ventriculomegaly. No transependymal flow of CSF, significant midline shift, or herniation. Electronically Signed   By: Jeannine Boga M.D.   On: 02/04/2020 02:37   CT Code Stroke Cerebral Perfusion with contrast  Result Date: 02/04/2020 CLINICAL DATA:  Follow-up examination for acute encephalopathy. EXAM: CT ANGIOGRAPHY HEAD AND NECK CT PERFUSION BRAIN TECHNIQUE: Multidetector CT imaging of the head and neck was performed using the standard protocol during bolus administration of intravenous contrast. Multiplanar CT image reconstructions and MIPs were obtained to evaluate the vascular anatomy. Carotid stenosis measurements (when applicable) are obtained utilizing NASCET criteria, using the distal internal carotid diameter as the denominator. Multiphase CT imaging of the brain was performed following IV bolus contrast injection. Subsequent parametric perfusion maps were calculated using RAPID software. CONTRAST:  164mL OMNIPAQUE IOHEXOL 350 MG/ML SOLN COMPARISON:  Prior head CT from 02/03/2020 as well as brain MRI from earlier the same day. FINDINGS: CTA NECK FINDINGS Aortic arch: Visualized aortic arch of normal caliber with normal branch pattern. Minimal plaque within the arch itself. No hemodynamically significant stenosis about the origin of the great vessels. Visualized subclavian arteries widely patent. Right carotid system: Right common carotid artery widely patent from its origin to the bifurcation without stenosis. Minimal centric calcified plaque at the right bifurcation without significant narrowing. Right ICA widely patent distally to  the skull base without stenosis, dissection or occlusion. Left carotid system: Left common and internal carotid arteries widely patent without stenosis, dissection or occlusion.  Vertebral arteries: Both vertebral arteries arise from the subclavian arteries. Vertebral arteries widely patent without stenosis, dissection or occlusion. Skeleton: No acute osseous abnormality. No discrete or worrisome osseous lesions. Moderate cervical spondylosis at C5-6 without high-grade stenosis. Focal sclerosis within the T4 vertebral body noted, nonspecific, but most likely benign. Other neck: No other acute soft tissue abnormality within the neck. No mass lesion or adenopathy. Upper chest: Scattered atelectatic changes noted within the visualized lungs. Visualized upper chest demonstrates no other acute finding. Review of the MIP images confirms the above findings CTA HEAD FINDINGS Anterior circulation: Internal carotid arteries patent to the termini without stenosis or other abnormality. A1 segments patent. Normal anterior communicator complex. Anterior cerebral arteries patent to their distal aspects without stenosis. No M1 stenosis or occlusion. Normal MCA bifurcations. Distal MCA branches well perfused and symmetric. Posterior circulation: Both vertebral arteries widely patent to the vertebrobasilar junction. Both picas patent. Basilar widely patent to its distal aspect without stenosis. Superior cerebellar and posterior cerebral arteries widely patent bilaterally. Venous sinuses: Grossly patent allowing for timing the contrast bolus. Anatomic variants: None significant. Review of the MIP images confirms the above findings CT Brain Perfusion Findings: ASPECTS: Not applicable. CBF (<30%) Volume: 10mL Perfusion (Tmax>6.0s) volume: 30mL Mismatch Volume: 21mL Infarction Location:Negative CT perfusion. IMPRESSION: 1. Normal CTA of the head and neck. No large vessel occlusion, hemodynamically significant stenosis, or other acute vascular abnormality. 2. Negative CT perfusion. Electronically Signed   By: Jeannine Boga M.D.   On: 02/04/2020 03:22   DG Chest Port 1 View  Result Date: 02/03/2020 CLINICAL  DATA:  Altered mental status EXAM: PORTABLE CHEST 1 VIEW COMPARISON:  Partial comparison to limited CT chest dated 03/08/2012 FINDINGS: Mild increased interstitial markings. Left lower lobe opacity, likely atelectasis. No pleural effusion or pneumothorax. The heart is normal in size. Old right 3rd and 4th rib fracture deformities. IMPRESSION: Mild left lower lobe opacity, likely atelectasis. Electronically Signed   By: Julian Hy M.D.   On: 02/03/2020 11:41   CT HEAD CODE STROKE WO CONTRAST  Addendum Date: 02/03/2020   ADDENDUM REPORT: 02/03/2020 10:18 ADDENDUM: This study was inadvertently signed before the report was completed, please disregard the above. Interhemispheric mass centered at the genu of the corpus callosum, fornices, and septum pellucidum with peripheral isodensity and central low-density. Ill-defined low-density seen in the bifrontal white matter. Findings are primarily concerning for aggressive glioma or lymphoma. There is mass effect on the foramen Monro with mild lateral ventriculomegaly but no herniation or shift. No acute hemorrhage. Case discussed with Dr. Rogene Houston Electronically Signed   By: Monte Fantasia M.D.   On: 02/03/2020 10:18   Result Date: 02/03/2020 CLINICAL DATA:  Code stroke. EXAM: CT HEAD WITHOUT CONTRAST TECHNIQUE: Contiguous axial images were obtained from the base of the skull through the vertex without intravenous contrast. COMPARISON:  None. FINDINGS: Brain: Masslike appearance at the anterior interhemispheric fissure along the fornices, genu of the corpus callosum, and septum pellucidum. The size of peripheral isodense appearance and central low-density. Vascular: Skull: Sinuses/Orbits: Other: These results were called by telephone at the time of interpretation on 02/03/2020 at 10:10 am to provider Fredia Sorrow , who verbally acknowledged these results. ASPECTS [facility] Stroke Program Early CT Score) - Ganglionic level infarction (caudate, lentiform nuclei,  internal capsule, insula, M1-M3 cortex): - Supraganglionic infarction (M4-M6 cortex): Total score (  0-10 with 10 being normal): IMPRESSION: 1. 2. ASPECTS is Electronically Signed: By: Monte Fantasia M.D. On: 02/03/2020 10:12    Review of Systems  Unable to perform ROS: Mental status change   Blood pressure 138/74, pulse 80, temperature 98.1 F (36.7 C), temperature source Oral, resp. rate 20, height 5\' 8"  (1.727 m), weight 70.3 kg, SpO2 100 %. Physical Exam  Constitutional: She appears lethargic.  Neurological: She appears lethargic.  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  Differential diagnosis includes high-grade glioma versus lymphoma I think it is unlikely to be lymphoma.  Patient more than likely need a stereotactic biopsy and continued work-up.  We have contacted Dr. Mickeal Skinner and neuro oncology I have also contacted Dr. Duffy Rhody to evaluate for possible stereotactic biopsy.  Armaan Pond P 02/04/2020, 9:30 AM

## 2020-02-04 NOTE — Progress Notes (Signed)
Pt's mother updated at this time.  °

## 2020-02-04 NOTE — Procedures (Signed)
Patient Name: Alicia Mcdonald  MRN: [id number]  Epilepsy Attending: Lora Havens  Referring Physician/Provider: Dr. Heath Lark Date: 02/04/2020 Duration: 25.33 mins  Patient history: 64 year old female presented with nausea vomiting headache and encephalopathy.  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  EEG evaluate for seizures.  Level of alertness: awake, asleep  AEDs during EEG study: Keppra  Technical aspects: This EEG study was done with scalp electrodes positioned according to the 10-20 International system of electrode placement. Electrical activity was acquired at a sampling rate of 500Hz  and reviewed with a high frequency filter of 70Hz  and a low frequency filter of 1Hz . EEG data were recorded continuously and digitally stored.   Description: The posterior dominant rhythm consists of 9-10 Hz activity of moderate voltage (25-35 uV) seen predominantly in posterior head regions, symmetric and reactive to eye opening and eye closing. Sleep was characterized by vertex waves, sleep spindles (12 to 14 Hz), maximal frontocentral region. EEG showed continuous generalized 5-6 Hz theta as well as intermittent 2-3Hz  delta slowing. Hyperventilation and photic stimulation were not performed.     ABNORMALITY -Continuous slow, generalized  IMPRESSION: This study is suggestive of mild to moderate diffuse encephalopathy, nonspecific etiology. No seizures or epileptiform discharges were seen throughout the recording.  Alicia Mcdonald

## 2020-02-04 NOTE — Progress Notes (Signed)
EEG complete - results pending 

## 2020-02-04 NOTE — Progress Notes (Signed)
Reason for consult:   Subjective: Patient somnolent, answers her name and intermittently follows commands   ROS: Unable to obtain due to poor mental status  Examination  Vital signs in last 24 hours: Temp:  [97.6 F (36.4 C)-98.7 F (37.1 C)] 98.5 F (36.9 C) (05/24 1623) Pulse Rate:  [55-99] 61 (05/24 1623) Resp:  [14-23] 20 (05/24 1623) BP: (113-140)/(56-88) 123/88 (05/24 1623) SpO2:  [94 %-100 %] 94 % (05/24 1623)  General: lying in be CVS: pulse-normal rate and rhythm RS: breathing comfortably Extremities: normal   Neuro: MS: Alert, abulic CN: pupils equal and reactive, face symmetric, tongue midline Motor: Moves all 4 extremities against gravity Reflexes:plantars: flexor Gait: not tested  Basic Metabolic Panel: Recent Labs  Lab 02/03/20 1030 02/04/20 1046  NA 126* 137  K 3.4* 3.8  CL 90* 104  CO2 22 26  GLUCOSE 209* 156*  BUN 17 12  CREATININE 0.51 0.62  CALCIUM 9.1 9.1    CBC: Recent Labs  Lab 02/03/20 1030 02/04/20 1046  WBC 9.1 7.8  NEUTROABS 8.3*  --   HGB 13.4 12.7  HCT 40.0 38.1  MCV 88.7 89.4  PLT 272 220     Coagulation Studies: Recent Labs    02/03/20 1030  LABPROT 12.7  INR 1.0    Imaging Reviewed:     ASSESSMENT AND PLAN  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

## 2020-02-04 NOTE — Progress Notes (Signed)
PROGRESS NOTE    Alicia Mcdonald  [id number] DOB: Jun 06, 1956 DOA: 02/03/2020 PCP: Redmond School, MD   Brief Narrative-[name] is a 64 y.o. female with medical history significant for vitamin D deficiency who was brought to the ED via EMS after her mother found her in the bathroom with significant confusion.  She was also noted to have vomited.  Apparently patient has been having severe headaches for several days and at the time of EMS arrival her speech was quite incoherent.  No other symptoms of fevers, chills, chest pain, shortness of breath, cough, abdominal pain, or diarrhea reported by the mother.  No specific weakness or numbness to the extremities or visual field deficits have been noted.  Patient is currently quite somnolent and encephalopathic and history could not be obtained directly.  I have called the mother who is the primary historian.  No other significant chronic medical issues are noted.   ED Course: Stable vital signs are noted and patient has EKG in sinus rhythm.  Serum sodium is 126 and potassium is 3.4 with glucose of 209.  Her 1 view chest x-ray demonstrates some left lower lobe opacity which appears to reflect atelectasis and Covid testing is pending.  CT scanning of the head was immediately performed on arrival under code stroke protocol and she is noted to have an anterior hemispheric brain mass suspicious for glioma versus lymphoma.  Teleneurology suspects that patient may have had a seizure and thus she has been loaded with Keppra and given some Ativan.  She has also been started on IV dexamethasone 10 mg and recommendations have been made for brain MRI as well as EEG evaluation.  EDP has also discussed case with neurosurgery Dr. Christella Noa who recommends transfer to [facility] for further evaluation.   Assessment & Plan:   Active Problems:   Seizure ([place])    #1 brain mass-MRI showsInterhemispheric mass centered at the genu of the corpus callosum, septum  pellucidum, and fornices, with extension into the left greater than right periventricular white matter. 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 Lymphoma would be the primary differential consideration.  Secondary mass effect on the foramen of Monro with lateral ventriculomegaly. No transependymal flow of CSF, significant midline shift, or herniation.  CT angiogram of the head and neck with no large vessel occlusion or hemodynamically significant stenosis, negative CT perfusion.  Continue Keppra and as needed Ativan and Decadron. Patient seen by neurosurgery who has informed Dr. Mickeal Skinner for possible biopsy of the mass.  #2 post ictal status post seizure-patient remains encephalopathic.  Continue Keppra.  Follow-up EEG.  #3 hyponatremia follow-up labs today. Normal TSH. Check urine and serum osmolality and cortisol level.  #4 DM with hemoglobin A1c of 6.8 no prior diagnosis of diabetes.   Estimated body mass index is 23.57 kg/m as calculated from the following:   Height as of this encounter: 5\' 8"  (1.727 m).   Weight as of this encounter: 70.3 kg.  DVT prophylaxis: SCD Code Status: Full code Family Communication: Discussed with mother and brother at bedside  disposition Plan:  Status is: Inpatient  Dispo: The patient is from: home              Anticipated d/c is to: unknown              Anticipated d/c date is: unknown              Patient currently is not medically stable to d/c.  Consultants: neuro neuro  surgery dr Mickeal Skinner   Procedures: none Antimicrobials:none  Subjective Family by the bedside mother and brother by the bedside they reported that patient woke up and recognizes him and went back to sleep. She is not responding to any verbal or tactile stimuli now.  Objective: Vitals:   02/03/20 2156 02/03/20 2323 02/04/20 0312 02/04/20 0840  BP: 139/74 139/74 140/70 138/74  Pulse: 73 73 83 80  Resp: (!) 23 20 20 20   Temp: 98.5 F (36.9 C) 98.7 F (37.1 C) 98  F (36.7 C) 98.1 F (36.7 C)  TempSrc:  Oral Oral Oral  SpO2: 98% 98% 98% 100%  Weight:      Height:        Intake/Output Summary (Last 24 hours) at 02/04/2020 0923 Last data filed at 02/03/2020 1653 Gross per 24 hour  Intake 789.72 ml  Output --  Net 789.72 ml   Filed Weights   02/03/20 1008  Weight: 70.3 kg    Examination:  General exam: Appears calm and comfortable  Respiratory system: Clear to auscultation. Respiratory effort normal. Cardiovascular system: S1 & S2 heard, RRR. No JVD, murmurs, rubs, gallops or clicks. No pedal edema. Gastrointestinal system: Abdomen is nondistended, soft and nontender. No organomegaly or masses felt. Normal bowel sounds heard. Central nervous system: Somnolent  extremities: Symmetric 5 x 5 power. Skin: No rashes, lesions or ulcers Psychiatry unable to assess  Data Reviewed: I have personally reviewed following labs and imaging studies  CBC: Recent Labs  Lab 02/03/20 1030  WBC 9.1  NEUTROABS 8.3*  HGB 13.4  HCT 40.0  MCV 88.7  PLT [id number]   Basic Metabolic Panel: Recent Labs  Lab 02/03/20 1030  NA 126*  K 3.4*  CL 90*  CO2 22  GLUCOSE 209*  BUN 17  CREATININE 0.51  CALCIUM 9.1   GFR: Estimated Creatinine Clearance: 71.7 mL/min (by C-G formula based on SCr of 0.51 mg/dL). Liver Function Tests: Recent Labs  Lab 02/03/20 1030  AST 26  ALT 26  ALKPHOS 70  BILITOT 0.9  PROT 7.2  ALBUMIN 4.4   No results for input(s): LIPASE, AMYLASE in the last 168 hours. No results for input(s): AMMONIA in the last 168 hours. Coagulation Profile: Recent Labs  Lab 02/03/20 1030  INR 1.0   Cardiac Enzymes: No results for input(s): CKTOTAL, CKMB, CKMBINDEX, TROPONINI in the last 168 hours. BNP (last 3 results) No results for input(s): PROBNP in the last 8760 hours. HbA1C: Recent Labs    02/03/20 1510  HGBA1C 6.8*   CBG: Recent Labs  Lab 02/03/20 1111 02/03/20 1557 02/03/20 2137 02/03/20 2344 02/04/20 0348  GLUCAP  188* 168* 123* 137* 136*   Lipid Profile: No results for input(s): CHOL, HDL, LDLCALC, TRIG, CHOLHDL, LDLDIRECT in the last 72 hours. Thyroid Function Tests: Recent Labs    02/03/20 1510  TSH 0.646   Anemia Panel: No results for input(s): VITAMINB12, FOLATE, FERRITIN, TIBC, IRON, RETICCTPCT in the last 72 hours. Sepsis Labs: No results for input(s): PROCALCITON, LATICACIDVEN in the last 168 hours.  Recent Results (from the past 240 hour(s))  SARS Coronavirus 2 by RT PCR (hospital order, performed in [facility] hospital lab) Nasopharyngeal Nasopharyngeal Swab     Status: None   Collection Time: 02/03/20 10:29 AM   Specimen: Nasopharyngeal Swab  Result Value Ref Range Status   SARS Coronavirus 2 NEGATIVE NEGATIVE Final    Comment: (NOTE) SARS-CoV-2 target nucleic acids are NOT DETECTED. The SARS-CoV-2 RNA is generally detectable in upper and  lower respiratory specimens during the acute phase of infection. The lowest concentration of SARS-CoV-2 viral copies this assay can detect is 250 copies / mL. A negative result does not preclude SARS-CoV-2 infection and should not be used as the sole basis for treatment or other patient management decisions.  A negative result may occur with improper specimen collection / handling, submission of specimen other than nasopharyngeal swab, presence of viral mutation(s) within the areas targeted by this assay, and inadequate number of viral copies (<250 copies / mL). A negative result must be combined with clinical observations, patient history, and epidemiological information. Fact Sheet for Patients:   StrictlyIdeas.no Fact Sheet for Healthcare Providers: BankingDealers.co.za This test is not yet approved or cleared  by the [place] FDA and has been authorized for detection and/or diagnosis of SARS-CoV-2 by FDA under an Emergency Use Authorization (EUA).  This EUA will remain in effect  (meaning this test can be used) for the duration of the COVID-19 declaration under Section 564(b)(1) of the Act, 21 U.S.C. section 360bbb-3(b)(1), unless the authorization is terminated or revoked sooner. Performed at [facility], [address], [place], [place] [address]          Radiology Studies: CT Code Stroke CTA Head W/WO contrast  Result Date: 02/04/2020 CLINICAL DATA:  Follow-up examination for acute encephalopathy. EXAM: CT ANGIOGRAPHY HEAD AND NECK CT PERFUSION BRAIN TECHNIQUE: Multidetector CT imaging of the head and neck was performed using the standard protocol during bolus administration of intravenous contrast. Multiplanar CT image reconstructions and MIPs were obtained to evaluate the vascular anatomy. Carotid stenosis measurements (when applicable) are obtained utilizing NASCET criteria, using the distal internal carotid diameter as the denominator. Multiphase CT imaging of the brain was performed following IV bolus contrast injection. Subsequent parametric perfusion maps were calculated using RAPID software. CONTRAST:  120mL OMNIPAQUE IOHEXOL 350 MG/ML SOLN COMPARISON:  Prior head CT from 02/03/2020 as well as brain MRI from earlier the same day. FINDINGS: CTA NECK FINDINGS Aortic arch: Visualized aortic arch of normal caliber with normal branch pattern. Minimal plaque within the arch itself. No hemodynamically significant stenosis about the origin of the great vessels. Visualized subclavian arteries widely patent. Right carotid system: Right common carotid artery widely patent from its origin to the bifurcation without stenosis. Minimal centric calcified plaque at the right bifurcation without significant narrowing. Right ICA widely patent distally to the skull base without stenosis, dissection or occlusion. Left carotid system: Left common and internal carotid arteries widely patent without stenosis, dissection or occlusion. Vertebral arteries: Both vertebral arteries arise from  the subclavian arteries. Vertebral arteries widely patent without stenosis, dissection or occlusion. Skeleton: No acute osseous abnormality. No discrete or worrisome osseous lesions. Moderate cervical spondylosis at C5-6 without high-grade stenosis. Focal sclerosis within the T4 vertebral body noted, nonspecific, but most likely benign. Other neck: No other acute soft tissue abnormality within the neck. No mass lesion or adenopathy. Upper chest: Scattered atelectatic changes noted within the visualized lungs. Visualized upper chest demonstrates no other acute finding. Review of the MIP images confirms the above findings CTA HEAD FINDINGS Anterior circulation: Internal carotid arteries patent to the termini without stenosis or other abnormality. A1 segments patent. Normal anterior communicator complex. Anterior cerebral arteries patent to their distal aspects without stenosis. No M1 stenosis or occlusion. Normal MCA bifurcations. Distal MCA branches well perfused and symmetric. Posterior circulation: Both vertebral arteries widely patent to the vertebrobasilar junction. Both picas patent. Basilar widely patent to its distal aspect without stenosis.  Superior cerebellar and posterior cerebral arteries widely patent bilaterally. Venous sinuses: Grossly patent allowing for timing the contrast bolus. Anatomic variants: None significant. Review of the MIP images confirms the above findings CT Brain Perfusion Findings: ASPECTS: Not applicable. CBF (<30%) Volume: 25mL Perfusion (Tmax>6.0s) volume: 26mL Mismatch Volume: 25mL Infarction Location:Negative CT perfusion. IMPRESSION: 1. Normal CTA of the head and neck. No large vessel occlusion, hemodynamically significant stenosis, or other acute vascular abnormality. 2. Negative CT perfusion. Electronically Signed   By: Jeannine Boga M.D.   On: 02/04/2020 03:22   CT Code Stroke CTA Neck W/WO contrast  Result Date: 02/04/2020 CLINICAL DATA:  Follow-up examination for  acute encephalopathy. EXAM: CT ANGIOGRAPHY HEAD AND NECK CT PERFUSION BRAIN TECHNIQUE: Multidetector CT imaging of the head and neck was performed using the standard protocol during bolus administration of intravenous contrast. Multiplanar CT image reconstructions and MIPs were obtained to evaluate the vascular anatomy. Carotid stenosis measurements (when applicable) are obtained utilizing NASCET criteria, using the distal internal carotid diameter as the denominator. Multiphase CT imaging of the brain was performed following IV bolus contrast injection. Subsequent parametric perfusion maps were calculated using RAPID software. CONTRAST:  175mL OMNIPAQUE IOHEXOL 350 MG/ML SOLN COMPARISON:  Prior head CT from 02/03/2020 as well as brain MRI from earlier the same day. FINDINGS: CTA NECK FINDINGS Aortic arch: Visualized aortic arch of normal caliber with normal branch pattern. Minimal plaque within the arch itself. No hemodynamically significant stenosis about the origin of the great vessels. Visualized subclavian arteries widely patent. Right carotid system: Right common carotid artery widely patent from its origin to the bifurcation without stenosis. Minimal centric calcified plaque at the right bifurcation without significant narrowing. Right ICA widely patent distally to the skull base without stenosis, dissection or occlusion. Left carotid system: Left common and internal carotid arteries widely patent without stenosis, dissection or occlusion. Vertebral arteries: Both vertebral arteries arise from the subclavian arteries. Vertebral arteries widely patent without stenosis, dissection or occlusion. Skeleton: No acute osseous abnormality. No discrete or worrisome osseous lesions. Moderate cervical spondylosis at C5-6 without high-grade stenosis. Focal sclerosis within the T4 vertebral body noted, nonspecific, but most likely benign. Other neck: No other acute soft tissue abnormality within the neck. No mass lesion  or adenopathy. Upper chest: Scattered atelectatic changes noted within the visualized lungs. Visualized upper chest demonstrates no other acute finding. Review of the MIP images confirms the above findings CTA HEAD FINDINGS Anterior circulation: Internal carotid arteries patent to the termini without stenosis or other abnormality. A1 segments patent. Normal anterior communicator complex. Anterior cerebral arteries patent to their distal aspects without stenosis. No M1 stenosis or occlusion. Normal MCA bifurcations. Distal MCA branches well perfused and symmetric. Posterior circulation: Both vertebral arteries widely patent to the vertebrobasilar junction. Both picas patent. Basilar widely patent to its distal aspect without stenosis. Superior cerebellar and posterior cerebral arteries widely patent bilaterally. Venous sinuses: Grossly patent allowing for timing the contrast bolus. Anatomic variants: None significant. Review of the MIP images confirms the above findings CT Brain Perfusion Findings: ASPECTS: Not applicable. CBF (<30%) Volume: 66mL Perfusion (Tmax>6.0s) volume: 1mL Mismatch Volume: 19mL Infarction Location:Negative CT perfusion. IMPRESSION: 1. Normal CTA of the head and neck. No large vessel occlusion, hemodynamically significant stenosis, or other acute vascular abnormality. 2. Negative CT perfusion. Electronically Signed   By: Jeannine Boga M.D.   On: 02/04/2020 03:22   MR BRAIN W WO CONTRAST  Result Date: 02/04/2020 CLINICAL DATA:  Initial evaluation for acute encephalopathy, migraine,  mass on prior head CT. EXAM: MRI HEAD WITHOUT AND WITH CONTRAST TECHNIQUE: Multiplanar, multiecho pulse sequences of the brain and surrounding structures were obtained without and with intravenous contrast. CONTRAST:  7.51mL GADAVIST GADOBUTROL 1 MMOL/ML IV SOLN COMPARISON:  Prior head CT from 02/03/2020. FINDINGS: Brain: There is an infiltrative mass centered at the anterior inter hemispheric fissure, with  extension along the fornices, septum pellucidum, and anterior genu of the corpus callosum. Lesion demonstrates heterogeneous T2/FLAIR signal intensity with fairly avid heterogeneous postcontrast enhancement. Exact measurements of this lesion difficult given location and infiltrative nature. Mild central restricted diffusion related to necrosis and/or hypercellularity. Extension into the left greater than right periventricular white matter. The adjacent frontal horns are somewhat compressed and irregular by the adjacent tumor. Mass effect on the adjacent foramen of Monro with associated lateral ventriculomegaly. No transependymal flow of CSF or herniation. No distant subarachnoid seeding. Trace right-to-left shift at the septum pellucidum. No other mass lesion or abnormal enhancement. No evidence for acute or subacute infarct. Gray-white matter differentiation maintained. No encephalomalacia to suggest chronic cortical infarction. No other evidence for acute or chronic intracranial hemorrhage. No made of a partially empty sella. Note made of a 1.8 cm arachnoid cyst at the left middle cranial fossa. Vascular: Major intracranial vascular flow voids are maintained. Skull and upper cervical spine: Craniocervical junction within normal limits. Upper cervical spine normal. Bone marrow signal intensity within normal limits. No focal marrow replacing lesion. No scalp soft tissue abnormality. Sinuses/Orbits: Globes and orbital soft tissues within normal limits are otherwise clear. No significant mastoid effusion. Inner ear structures grossly normal. Noted within the ethmoidal air cells. Paranasal sinuses Other: None. IMPRESSION: 1. Interhemispheric mass centered at the genu of the corpus callosum, septum pellucidum, and fornices, with extension into the left greater than right periventricular white matter. 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 Lymphoma would be the primary differential consideration. 2.  Secondary mass effect on the foramen of Monro with lateral ventriculomegaly. No transependymal flow of CSF, significant midline shift, or herniation. Electronically Signed   By: Jeannine Boga M.D.   On: 02/04/2020 02:37   CT Code Stroke Cerebral Perfusion with contrast  Result Date: 02/04/2020 CLINICAL DATA:  Follow-up examination for acute encephalopathy. EXAM: CT ANGIOGRAPHY HEAD AND NECK CT PERFUSION BRAIN TECHNIQUE: Multidetector CT imaging of the head and neck was performed using the standard protocol during bolus administration of intravenous contrast. Multiplanar CT image reconstructions and MIPs were obtained to evaluate the vascular anatomy. Carotid stenosis measurements (when applicable) are obtained utilizing NASCET criteria, using the distal internal carotid diameter as the denominator. Multiphase CT imaging of the brain was performed following IV bolus contrast injection. Subsequent parametric perfusion maps were calculated using RAPID software. CONTRAST:  115mL OMNIPAQUE IOHEXOL 350 MG/ML SOLN COMPARISON:  Prior head CT from 02/03/2020 as well as brain MRI from earlier the same day. FINDINGS: CTA NECK FINDINGS Aortic arch: Visualized aortic arch of normal caliber with normal branch pattern. Minimal plaque within the arch itself. No hemodynamically significant stenosis about the origin of the great vessels. Visualized subclavian arteries widely patent. Right carotid system: Right common carotid artery widely patent from its origin to the bifurcation without stenosis. Minimal centric calcified plaque at the right bifurcation without significant narrowing. Right ICA widely patent distally to the skull base without stenosis, dissection or occlusion. Left carotid system: Left common and internal carotid arteries widely patent without stenosis, dissection or occlusion. Vertebral arteries: Both vertebral arteries arise from the subclavian arteries. Vertebral  arteries widely patent without stenosis,  dissection or occlusion. Skeleton: No acute osseous abnormality. No discrete or worrisome osseous lesions. Moderate cervical spondylosis at C5-6 without high-grade stenosis. Focal sclerosis within the T4 vertebral body noted, nonspecific, but most likely benign. Other neck: No other acute soft tissue abnormality within the neck. No mass lesion or adenopathy. Upper chest: Scattered atelectatic changes noted within the visualized lungs. Visualized upper chest demonstrates no other acute finding. Review of the MIP images confirms the above findings CTA HEAD FINDINGS Anterior circulation: Internal carotid arteries patent to the termini without stenosis or other abnormality. A1 segments patent. Normal anterior communicator complex. Anterior cerebral arteries patent to their distal aspects without stenosis. No M1 stenosis or occlusion. Normal MCA bifurcations. Distal MCA branches well perfused and symmetric. Posterior circulation: Both vertebral arteries widely patent to the vertebrobasilar junction. Both picas patent. Basilar widely patent to its distal aspect without stenosis. Superior cerebellar and posterior cerebral arteries widely patent bilaterally. Venous sinuses: Grossly patent allowing for timing the contrast bolus. Anatomic variants: None significant. Review of the MIP images confirms the above findings CT Brain Perfusion Findings: ASPECTS: Not applicable. CBF (<30%) Volume: 74mL Perfusion (Tmax>6.0s) volume: 28mL Mismatch Volume: 51mL Infarction Location:Negative CT perfusion. IMPRESSION: 1. Normal CTA of the head and neck. No large vessel occlusion, hemodynamically significant stenosis, or other acute vascular abnormality. 2. Negative CT perfusion. Electronically Signed   By: Jeannine Boga M.D.   On: 02/04/2020 03:22   DG Chest Port 1 View  Result Date: 02/03/2020 CLINICAL DATA:  Altered mental status EXAM: PORTABLE CHEST 1 VIEW COMPARISON:  Partial comparison to limited CT chest dated 03/08/2012  FINDINGS: Mild increased interstitial markings. Left lower lobe opacity, likely atelectasis. No pleural effusion or pneumothorax. The heart is normal in size. Old right 3rd and 4th rib fracture deformities. IMPRESSION: Mild left lower lobe opacity, likely atelectasis. Electronically Signed   By: Julian Hy M.D.   On: 02/03/2020 11:41   CT HEAD CODE STROKE WO CONTRAST  Addendum Date: 02/03/2020   ADDENDUM REPORT: 02/03/2020 10:18 ADDENDUM: This study was inadvertently signed before the report was completed, please disregard the above. Interhemispheric mass centered at the genu of the corpus callosum, fornices, and septum pellucidum with peripheral isodensity and central low-density. Ill-defined low-density seen in the bifrontal white matter. Findings are primarily concerning for aggressive glioma or lymphoma. There is mass effect on the foramen Monro with mild lateral ventriculomegaly but no herniation or shift. No acute hemorrhage. Case discussed with Dr. Rogene Houston Electronically Signed   By: Monte Fantasia M.D.   On: 02/03/2020 10:18   Result Date: 02/03/2020 CLINICAL DATA:  Code stroke. EXAM: CT HEAD WITHOUT CONTRAST TECHNIQUE: Contiguous axial images were obtained from the base of the skull through the vertex without intravenous contrast. COMPARISON:  None. FINDINGS: Brain: Masslike appearance at the anterior interhemispheric fissure along the fornices, genu of the corpus callosum, and septum pellucidum. The size of peripheral isodense appearance and central low-density. Vascular: Skull: Sinuses/Orbits: Other: These results were called by telephone at the time of interpretation on 02/03/2020 at 10:10 am to provider Fredia Sorrow , who verbally acknowledged these results. ASPECTS [facility] Stroke Program Early CT Score) - Ganglionic level infarction (caudate, lentiform nuclei, internal capsule, insula, M1-M3 cortex): - Supraganglionic infarction (M4-M6 cortex): Total score (0-10 with 10 being  normal): IMPRESSION: 1. 2. ASPECTS is Electronically Signed: By: Monte Fantasia M.D. On: 02/03/2020 10:12        Scheduled Meds: . dexamethasone (DECADRON) injection  4  mg Intravenous Q6H  . insulin aspart  0-9 Units Subcutaneous Q4H   Continuous Infusions: . sodium chloride 500 mL (02/03/20 1101)  . levETIRAcetam 500 mg (02/04/20 0000)     LOS: 1 day     Georgette Shell, MD  02/04/2020, 9:23 AM

## 2020-02-04 NOTE — Progress Notes (Signed)
Pt MEWS yellow; Vital signs rechecked. Patient is alert to pain and sleepy. Patient was given PRN med for combativeness. Will escalate Vital signs to every 2 hours x 2. Charge nurse notified. Will continue to monitor.

## 2020-02-04 NOTE — Consult Note (Signed)
[facility] Consult Note  Patient Care Team: Redmond School, MD as PCP - General (Internal Medicine) Danie Binder, MD (Inactive) as Attending Physician (Gastroenterology)  CHIEF COMPLAINTS/PURPOSE OF CONSULTATION:  Frontal Mass Altered Mental Status  HISTORY OF PRESENTING ILLNESS:  Alicia Mcdonald 64 y.o. female presented to medical attention after being found down, awake by her mother yesterday.  She was apparently poorly coherent and not alert at the time, which is consistent with her presentation in the ED.  No seizure was witnessed, but patient had been complaining of new onset headaches for several days prior.  CNS imaging demonstrated heterogeneous enhancing mass in the mesial frontal lobes.  Patient has no real complaints at this time, currently in arm restraints.  MEDICAL HISTORY:  Past Medical History:  Diagnosis Date  . Cancer ([place])   . Iron deficiency   . Migraine   . Vitamin D deficiency     SURGICAL HISTORY: Past Surgical History:  Procedure Laterality Date  . COLONOSCOPY N/A 04/24/2013   Procedure: COLONOSCOPY;  Surgeon: Danie Binder, MD;  Location: [facility];  Service: Endoscopy;  Laterality: N/A;  1:45  . MELANOMA EXCISION  1977    SOCIAL HISTORY: Social History   Socioeconomic History  . Marital status: Single    Spouse name: Not on file  . Number of children: Not on file  . Years of education: Not on file  . Highest education level: Not on file  Occupational History  . 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 Smoking status: Never Smoker  . Smokeless tobacco: Never Used  Substance and Sexual Activity  . Alcohol use: Yes    Comment: once a month, rare  . Drug use: No  . Sexual activity: Not on file  Other Topics Concern  . Not on file  Social History Narrative  . Not on file   Social Determinants of Health    Financial Resource Strain:   . Difficulty of Paying Living Expenses:   Food Insecurity:   . Worried About Charity fundraiser in the Last Year:   . Arboriculturist in the Last Year:   Transportation Needs:   . Film/video editor (Medical):   Marland Kitchen Lack of Transportation (Non-Medical):   Physical Activity:   . Days of Exercise per Week:   . Minutes of Exercise per Session:   Stress:   . Feeling of Stress :   Social Connections:   . Frequency of Communication with Friends and Family:   . Frequency of Social Gatherings with Friends and Family:   . Attends Religious Services:   . Active Member of Clubs or Organizations:   . Attends Archivist Meetings:   Marland Kitchen Marital Status:   Intimate Partner Violence:   . Fear of Current or Ex-Partner:   . Emotionally Abused:   Marland Kitchen Physically Abused:   . Sexually Abused:     FAMILY HISTORY: Family History  Problem Relation Age of Onset  . Colon cancer Neg Hx     ALLERGIES:  has No Known Allergies.  MEDICATIONS:  Current Facility-Administered Medications  Medication Dose Route Frequency Provider Last Rate Last Admin  . 0.9 %  sodium chloride infusion   Intravenous Continuous Heath Lark D, DO 75 mL/hr at 02/03/20 1101 500 mL at 02/03/20 1101  . acetaminophen (TYLENOL) tablet 650 mg  650 mg Oral  Q6H PRN Manuella Ghazi, Pratik D, DO       Or  . acetaminophen (TYLENOL) suppository 650 mg  650 mg Rectal Q6H PRN Manuella Ghazi, Pratik D, DO      . haloperidol lactate (HALDOL) injection 2 mg  2 mg Intravenous Q6H PRN Manuella Ghazi, Pratik D, DO   2 mg at 02/04/20 0059  . insulin aspart (novoLOG) injection 0-9 Units  0-9 Units Subcutaneous Q4H Shah, Pratik D, DO   1 Units at 02/04/20 1317  . levETIRAcetam (KEPPRA) IVPB 500 mg/100 mL premix  500 mg Intravenous Q12H Shah, Pratik D, DO 400 mL/hr at 02/04/20 1325 500 mg at 02/04/20 1325  . LORazepam (ATIVAN) injection 2 mg  2 mg Intravenous Q4H PRN Manuella Ghazi, Pratik D, DO   2 mg at 02/03/20 1428  . ondansetron (ZOFRAN)  tablet 4 mg  4 mg Oral Q6H PRN Manuella Ghazi, Pratik D, DO       Or  . ondansetron (ZOFRAN) injection 4 mg  4 mg Intravenous Q6H PRN Manuella Ghazi, Pratik D, DO        REVIEW OF SYSTEMS:   Limited by mental status  PHYSICAL EXAMINATION: Vitals:   02/04/20 0840 02/04/20 1100  BP: 138/74 121/64  Pulse: 80 (!) 55  Resp: 20 20  Temp: 98.1 F (36.7 C) 97.9 F (36.6 C)  SpO2: 100% 96%   KPS: 70. General: Alert, cooperative, pleasant, in no acute distress Head: Craniotomy scar noted, dry and intact. EENT: No conjunctival injection or scleral icterus. Oral mucosa moist Lungs: Resp effort normal Cardiac: Regular rate and rhythm Abdomen: Soft, non-distended abdomen Skin: No rashes cyanosis or petechiae. Extremities: No clubbing or edema  NEUROLOGIC EXAM: Mental Status: Awake, alert, attentive to examiner.  Disconnected, abulic affect, with poor insight and concern regarding current condition.  Otherwise oriented to self and environment. Language is fluent with intact comprehension.  Cranial Nerves: Visual acuity is grossly normal. Visual fields are full. Extra-ocular movements intact. No ptosis. Face is symmetric, tongue midline. Motor: Tone and bulk are normal. Power is full in both arms and legs. Reflexes are symmetric, no pathologic reflexes present. Intact finger to nose bilaterally Sensory: Intact to light touch and temperature Gait: Deferred  LABORATORY DATA:  I have reviewed the data as listed Lab Results  Component Value Date   WBC 7.8 02/04/2020   HGB 12.7 02/04/2020   HCT 38.1 02/04/2020   MCV 89.4 02/04/2020   PLT 220 02/04/2020   Recent Labs    02/03/20 1030 02/04/20 1046  NA 126* 137  K 3.4* 3.8  CL 90* 104  CO2 22 26  GLUCOSE 209* 156*  BUN 17 12  CREATININE 0.51 0.62  CALCIUM 9.1 9.1  GFRNONAA >60 >60  GFRAA >60 >60  PROT 7.2 6.0*  ALBUMIN 4.4 3.3*  AST 26 21  ALT 26 23  ALKPHOS 70 57  BILITOT 0.9 0.8    RADIOGRAPHIC STUDIES: I have personally reviewed the  radiological images as listed and agreed with the findings in the report. CT Code Stroke CTA Head W/WO contrast  Result Date: 02/04/2020 CLINICAL DATA:  Follow-up examination for acute encephalopathy. EXAM: CT ANGIOGRAPHY HEAD AND NECK CT PERFUSION BRAIN TECHNIQUE: Multidetector CT imaging of the head and neck was performed using the standard protocol during bolus administration of intravenous contrast. Multiplanar CT image reconstructions and MIPs were obtained to evaluate the vascular anatomy. Carotid stenosis measurements (when applicable) are obtained utilizing NASCET criteria, using the distal internal carotid diameter as the denominator. Multiphase CT imaging of  the brain was performed following IV bolus contrast injection. Subsequent parametric perfusion maps were calculated using RAPID software. CONTRAST:  180mL OMNIPAQUE IOHEXOL 350 MG/ML SOLN COMPARISON:  Prior head CT from 02/03/2020 as well as brain MRI from earlier the same day. FINDINGS: CTA NECK FINDINGS Aortic arch: Visualized aortic arch of normal caliber with normal branch pattern. Minimal plaque within the arch itself. No hemodynamically significant stenosis about the origin of the great vessels. Visualized subclavian arteries widely patent. Right carotid system: Right common carotid artery widely patent from its origin to the bifurcation without stenosis. Minimal centric calcified plaque at the right bifurcation without significant narrowing. Right ICA widely patent distally to the skull base without stenosis, dissection or occlusion. Left carotid system: Left common and internal carotid arteries widely patent without stenosis, dissection or occlusion. Vertebral arteries: Both vertebral arteries arise from the subclavian arteries. Vertebral arteries widely patent without stenosis, dissection or occlusion. Skeleton: No acute osseous abnormality. No discrete or worrisome osseous lesions. Moderate cervical spondylosis at C5-6 without high-grade  stenosis. Focal sclerosis within the T4 vertebral body noted, nonspecific, but most likely benign. Other neck: No other acute soft tissue abnormality within the neck. No mass lesion or adenopathy. Upper chest: Scattered atelectatic changes noted within the visualized lungs. Visualized upper chest demonstrates no other acute finding. Review of the MIP images confirms the above findings CTA HEAD FINDINGS Anterior circulation: Internal carotid arteries patent to the termini without stenosis or other abnormality. A1 segments patent. Normal anterior communicator complex. Anterior cerebral arteries patent to their distal aspects without stenosis. No M1 stenosis or occlusion. Normal MCA bifurcations. Distal MCA branches well perfused and symmetric. Posterior circulation: Both vertebral arteries widely patent to the vertebrobasilar junction. Both picas patent. Basilar widely patent to its distal aspect without stenosis. Superior cerebellar and posterior cerebral arteries widely patent bilaterally. Venous sinuses: Grossly patent allowing for timing the contrast bolus. Anatomic variants: None significant. Review of the MIP images confirms the above findings CT Brain Perfusion Findings: ASPECTS: Not applicable. CBF (<30%) Volume: 96mL Perfusion (Tmax>6.0s) volume: 60mL Mismatch Volume: 59mL Infarction Location:Negative CT perfusion. IMPRESSION: 1. Normal CTA of the head and neck. No large vessel occlusion, hemodynamically significant stenosis, or other acute vascular abnormality. 2. Negative CT perfusion. Electronically Signed   By: Jeannine Boga M.D.   On: 02/04/2020 03:22   CT Code Stroke CTA Neck W/WO contrast  Result Date: 02/04/2020 CLINICAL DATA:  Follow-up examination for acute encephalopathy. EXAM: CT ANGIOGRAPHY HEAD AND NECK CT PERFUSION BRAIN TECHNIQUE: Multidetector CT imaging of the head and neck was performed using the standard protocol during bolus administration of intravenous contrast. Multiplanar CT  image reconstructions and MIPs were obtained to evaluate the vascular anatomy. Carotid stenosis measurements (when applicable) are obtained utilizing NASCET criteria, using the distal internal carotid diameter as the denominator. Multiphase CT imaging of the brain was performed following IV bolus contrast injection. Subsequent parametric perfusion maps were calculated using RAPID software. CONTRAST:  133mL OMNIPAQUE IOHEXOL 350 MG/ML SOLN COMPARISON:  Prior head CT from 02/03/2020 as well as brain MRI from earlier the same day. FINDINGS: CTA NECK FINDINGS Aortic arch: Visualized aortic arch of normal caliber with normal branch pattern. Minimal plaque within the arch itself. No hemodynamically significant stenosis about the origin of the great vessels. Visualized subclavian arteries widely patent. Right carotid system: Right common carotid artery widely patent from its origin to the bifurcation without stenosis. Minimal centric calcified plaque at the right bifurcation without significant narrowing. Right ICA widely patent  distally to the skull base without stenosis, dissection or occlusion. Left carotid system: Left common and internal carotid arteries widely patent without stenosis, dissection or occlusion. Vertebral arteries: Both vertebral arteries arise from the subclavian arteries. Vertebral arteries widely patent without stenosis, dissection or occlusion. Skeleton: No acute osseous abnormality. No discrete or worrisome osseous lesions. Moderate cervical spondylosis at C5-6 without high-grade stenosis. Focal sclerosis within the T4 vertebral body noted, nonspecific, but most likely benign. Other neck: No other acute soft tissue abnormality within the neck. No mass lesion or adenopathy. Upper chest: Scattered atelectatic changes noted within the visualized lungs. Visualized upper chest demonstrates no other acute finding. Review of the MIP images confirms the above findings CTA HEAD FINDINGS Anterior  circulation: Internal carotid arteries patent to the termini without stenosis or other abnormality. A1 segments patent. Normal anterior communicator complex. Anterior cerebral arteries patent to their distal aspects without stenosis. No M1 stenosis or occlusion. Normal MCA bifurcations. Distal MCA branches well perfused and symmetric. Posterior circulation: Both vertebral arteries widely patent to the vertebrobasilar junction. Both picas patent. Basilar widely patent to its distal aspect without stenosis. Superior cerebellar and posterior cerebral arteries widely patent bilaterally. Venous sinuses: Grossly patent allowing for timing the contrast bolus. Anatomic variants: None significant. Review of the MIP images confirms the above findings CT Brain Perfusion Findings: ASPECTS: Not applicable. CBF (<30%) Volume: 89mL Perfusion (Tmax>6.0s) volume: 33mL Mismatch Volume: 54mL Infarction Location:Negative CT perfusion. IMPRESSION: 1. Normal CTA of the head and neck. No large vessel occlusion, hemodynamically significant stenosis, or other acute vascular abnormality. 2. Negative CT perfusion. Electronically Signed   By: Jeannine Boga M.D.   On: 02/04/2020 03:22   MR BRAIN W WO CONTRAST  Result Date: 02/04/2020 CLINICAL DATA:  Initial evaluation for acute encephalopathy, migraine, mass on prior head CT. EXAM: MRI HEAD WITHOUT AND WITH CONTRAST TECHNIQUE: Multiplanar, multiecho pulse sequences of the brain and surrounding structures were obtained without and with intravenous contrast. CONTRAST:  7.34mL GADAVIST GADOBUTROL 1 MMOL/ML IV SOLN COMPARISON:  Prior head CT from 02/03/2020. FINDINGS: Brain: There is an infiltrative mass centered at the anterior inter hemispheric fissure, with extension along the fornices, septum pellucidum, and anterior genu of the corpus callosum. Lesion demonstrates heterogeneous T2/FLAIR signal intensity with fairly avid heterogeneous postcontrast enhancement. Exact measurements of this  lesion difficult given location and infiltrative nature. Mild central restricted diffusion related to necrosis and/or hypercellularity. Extension into the left greater than right periventricular white matter. The adjacent frontal horns are somewhat compressed and irregular by the adjacent tumor. Mass effect on the adjacent foramen of Monro with associated lateral ventriculomegaly. No transependymal flow of CSF or herniation. No distant subarachnoid seeding. Trace right-to-left shift at the septum pellucidum. No other mass lesion or abnormal enhancement. No evidence for acute or subacute infarct. Gray-white matter differentiation maintained. No encephalomalacia to suggest chronic cortical infarction. No other evidence for acute or chronic intracranial hemorrhage. No made of a partially empty sella. Note made of a 1.8 cm arachnoid cyst at the left middle cranial fossa. Vascular: Major intracranial vascular flow voids are maintained. Skull and upper cervical spine: Craniocervical junction within normal limits. Upper cervical spine normal. Bone marrow signal intensity within normal limits. No focal marrow replacing lesion. No scalp soft tissue abnormality. Sinuses/Orbits: Globes and orbital soft tissues within normal limits are otherwise clear. No significant mastoid effusion. Inner ear structures grossly normal. Noted within the ethmoidal air cells. Paranasal sinuses Other: None. IMPRESSION: 1. Interhemispheric mass centered at the genu of  the corpus callosum, septum pellucidum, and fornices, with extension into the left greater than right periventricular white matter. 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 Lymphoma would be the primary differential consideration. 2. Secondary mass effect on the foramen of Monro with lateral ventriculomegaly. No transependymal flow of CSF, significant midline shift, or herniation. Electronically Signed   By: Jeannine Boga M.D.   On: 02/04/2020 02:37   CT Code  Stroke Cerebral Perfusion with contrast  Result Date: 02/04/2020 CLINICAL DATA:  Follow-up examination for acute encephalopathy. EXAM: CT ANGIOGRAPHY HEAD AND NECK CT PERFUSION BRAIN TECHNIQUE: Multidetector CT imaging of the head and neck was performed using the standard protocol during bolus administration of intravenous contrast. Multiplanar CT image reconstructions and MIPs were obtained to evaluate the vascular anatomy. Carotid stenosis measurements (when applicable) are obtained utilizing NASCET criteria, using the distal internal carotid diameter as the denominator. Multiphase CT imaging of the brain was performed following IV bolus contrast injection. Subsequent parametric perfusion maps were calculated using RAPID software. CONTRAST:  142mL OMNIPAQUE IOHEXOL 350 MG/ML SOLN COMPARISON:  Prior head CT from 02/03/2020 as well as brain MRI from earlier the same day. FINDINGS: CTA NECK FINDINGS Aortic arch: Visualized aortic arch of normal caliber with normal branch pattern. Minimal plaque within the arch itself. No hemodynamically significant stenosis about the origin of the great vessels. Visualized subclavian arteries widely patent. Right carotid system: Right common carotid artery widely patent from its origin to the bifurcation without stenosis. Minimal centric calcified plaque at the right bifurcation without significant narrowing. Right ICA widely patent distally to the skull base without stenosis, dissection or occlusion. Left carotid system: Left common and internal carotid arteries widely patent without stenosis, dissection or occlusion. Vertebral arteries: Both vertebral arteries arise from the subclavian arteries. Vertebral arteries widely patent without stenosis, dissection or occlusion. Skeleton: No acute osseous abnormality. No discrete or worrisome osseous lesions. Moderate cervical spondylosis at C5-6 without high-grade stenosis. Focal sclerosis within the T4 vertebral body noted, nonspecific,  but most likely benign. Other neck: No other acute soft tissue abnormality within the neck. No mass lesion or adenopathy. Upper chest: Scattered atelectatic changes noted within the visualized lungs. Visualized upper chest demonstrates no other acute finding. Review of the MIP images confirms the above findings CTA HEAD FINDINGS Anterior circulation: Internal carotid arteries patent to the termini without stenosis or other abnormality. A1 segments patent. Normal anterior communicator complex. Anterior cerebral arteries patent to their distal aspects without stenosis. No M1 stenosis or occlusion. Normal MCA bifurcations. Distal MCA branches well perfused and symmetric. Posterior circulation: Both vertebral arteries widely patent to the vertebrobasilar junction. Both picas patent. Basilar widely patent to its distal aspect without stenosis. Superior cerebellar and posterior cerebral arteries widely patent bilaterally. Venous sinuses: Grossly patent allowing for timing the contrast bolus. Anatomic variants: None significant. Review of the MIP images confirms the above findings CT Brain Perfusion Findings: ASPECTS: Not applicable. CBF (<30%) Volume: 67mL Perfusion (Tmax>6.0s) volume: 27mL Mismatch Volume: 51mL Infarction Location:Negative CT perfusion. IMPRESSION: 1. Normal CTA of the head and neck. No large vessel occlusion, hemodynamically significant stenosis, or other acute vascular abnormality. 2. Negative CT perfusion. Electronically Signed   By: Jeannine Boga M.D.   On: 02/04/2020 03:22   DG Chest Port 1 View  Result Date: 02/03/2020 CLINICAL DATA:  Altered mental status EXAM: PORTABLE CHEST 1 VIEW COMPARISON:  Partial comparison to limited CT chest dated 03/08/2012 FINDINGS: Mild increased interstitial markings. Left lower lobe opacity, likely atelectasis. No pleural  effusion or pneumothorax. The heart is normal in size. Old right 3rd and 4th rib fracture deformities. IMPRESSION: Mild left lower lobe  opacity, likely atelectasis. Electronically Signed   By: Julian Hy M.D.   On: 02/03/2020 11:41   EEG adult  Result Date: 02/04/2020 Lora Havens, MD     02/04/2020  1:21 PM Patient Name: LETITA MKRTCHYAN MRN: [id number] Epilepsy Attending: Lora Havens Referring Physician/Provider: Dr. Heath Lark Date: 02/04/2020 Duration: 25.33 mins Patient history: 64 year old female presented with nausea vomiting headache and encephalopathy.  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  EEG evaluate for seizures. Level of alertness: awake, asleep AEDs during EEG study: Keppra Technical aspects: This EEG study was done with scalp electrodes positioned according to the 10-20 International system of electrode placement. Electrical activity was acquired at a sampling rate of 500Hz  and reviewed with a high frequency filter of 70Hz  and a low frequency filter of 1Hz . EEG data were recorded continuously and digitally stored. Description: The posterior dominant rhythm consists of 9-10 Hz activity of moderate voltage (25-35 uV) seen predominantly in posterior head regions, symmetric and reactive to eye opening and eye closing. Sleep was characterized by vertex waves, sleep spindles (12 to 14 Hz), maximal frontocentral region. EEG showed continuous generalized 5-6 Hz theta as well as intermittent 2-3Hz  delta slowing. Hyperventilation and photic stimulation were not performed.   ABNORMALITY -Continuous slow, generalized IMPRESSION: This study is suggestive of mild to moderate diffuse encephalopathy, nonspecific etiology. No seizures or epileptiform discharges were seen throughout the recording. Lora Havens   CT HEAD CODE STROKE WO CONTRAST  Addendum Date: 02/03/2020   ADDENDUM REPORT: 02/03/2020 10:18 ADDENDUM: This study was inadvertently signed before the report was completed, please disregard the above. Interhemispheric mass centered at the genu of the corpus callosum, fornices,  and septum pellucidum with peripheral isodensity and central low-density. Ill-defined low-density seen in the bifrontal white matter. Findings are primarily concerning for aggressive glioma or lymphoma. There is mass effect on the foramen Monro with mild lateral ventriculomegaly but no herniation or shift. No acute hemorrhage. Case discussed with Dr. Rogene Houston Electronically Signed   By: Monte Fantasia M.D.   On: 02/03/2020 10:18   Result Date: 02/03/2020 CLINICAL DATA:  Code stroke. EXAM: CT HEAD WITHOUT CONTRAST TECHNIQUE: Contiguous axial images were obtained from the base of the skull through the vertex without intravenous contrast. COMPARISON:  None. FINDINGS: Brain: Masslike appearance at the anterior interhemispheric fissure along the fornices, genu of the corpus callosum, and septum pellucidum. The size of peripheral isodense appearance and central low-density. Vascular: Skull: Sinuses/Orbits: Other: These results were called by telephone at the time of interpretation on 02/03/2020 at 10:10 am to provider Fredia Sorrow , who verbally acknowledged these results. ASPECTS [facility] Stroke Program Early CT Score) - Ganglionic level infarction (caudate, lentiform nuclei, internal capsule, insula, M1-M3 cortex): - Supraganglionic infarction (M4-M6 cortex): Total score (0-10 with 10 being normal): IMPRESSION: 1. 2. ASPECTS is Electronically Signed: By: Monte Fantasia M.D. 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  Her rapid decline, found down presentation is most consistent with unwitnessed epileptic event.    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   Recommendations: -Check contrast enhanced CT C/A/P to preliminarily rule out primary systemic bulky lymphoma which may be  more amenable to  biopsy -Otherwise should proceed with stereotactic brain biopsy with Dr. Marcello Moores, case has been discussed -Can decrease decadron to 4mg  daily if desired given behavioral issues and need for PRN neuroleptics.  There is minimal edema here and decadron may interfere with lymphoma histology from biopsy.  Please con't AED regimen.  Will continue to follow along, call with questions or updates.  All questions were answered. The patient knows to call the clinic with any problems, questions or concerns.  The total time spent in the encounter was 55 minutes and more than 50% was on counseling and review of test results     Ventura Sellers, MD 02/04/2020 4:12 PM

## 2020-02-05 ENCOUNTER — Encounter (HOSPITAL_COMMUNITY): Payer: Self-pay | Admitting: Internal Medicine

## 2020-02-05 ENCOUNTER — Inpatient Hospital Stay (HOSPITAL_COMMUNITY): Payer: BC Managed Care – PPO

## 2020-02-05 LAB — GLUCOSE, CAPILLARY
Glucose-Capillary: 118 mg/dL — ABNORMAL HIGH (ref 70–99)
Glucose-Capillary: 87 mg/dL (ref 70–99)
Glucose-Capillary: 99 mg/dL (ref 70–99)
Glucose-Capillary: 76 mg/dL (ref 70–99)
Glucose-Capillary: 83 mg/dL (ref 70–99)
Glucose-Capillary: 78 mg/dL (ref 70–99)

## 2020-02-05 MED ORDER — CHLORHEXIDINE GLUCONATE CLOTH 2 % EX PADS
6.0000 | MEDICATED_PAD | Freq: Once | CUTANEOUS | Status: AC
  Administered 2020-02-06: 6 via TOPICAL

## 2020-02-05 MED ORDER — CHLORHEXIDINE GLUCONATE CLOTH 2 % EX PADS
6.0000 | MEDICATED_PAD | Freq: Once | CUTANEOUS | Status: AC
  Administered 2020-02-05: 6 via TOPICAL

## 2020-02-05 MED ORDER — CHLORHEXIDINE GLUCONATE CLOTH 2 % EX PADS: 6.0000 | MEDICATED_PAD | Freq: Once | CUTANEOUS | Status: DC

## 2020-02-05 MED ORDER — IOHEXOL 300 MG/ML  SOLN
100.0000 mL | Freq: Once | INTRAMUSCULAR | Status: AC | PRN
  Administered 2020-02-05: 100 mL via INTRAVENOUS

## 2020-02-05 NOTE — Progress Notes (Signed)
Patients family members have expressed concerns about patient complaining of a headache and not remembering that she just had a new IV placed in her arm.  Patient able to tell me where she is, her name, her family members name and that she just had her IV placed in her left arm.  She states that her headache is not severe but does throb at a pain level of 4/10.  This nurse has notified the provider, will continue to monitor.

## 2020-02-05 NOTE — Progress Notes (Signed)
Subjective: Patient reports no headache or nausea. Objective: Vital signs in last 24 hours: Temp:  [98 F (36.7 C)-98.5 F (36.9 C)] 98 F (36.7 C) (05/25 0834) Pulse Rate:  [61-69] 69 (05/25 0834) Resp:  [17-20] 17 (05/25 0834) BP: (121-144)/(70-88) 144/71 (05/25 0834) SpO2:  [94 %-98 %] 98 % (05/25 0330)  Intake/Output from previous day: 05/24 0701 - 05/25 0700 In: 610 [I.V.:510; IV Piggyback:100] Out: 1300 [Urine:1300] Intake/Output this shift: No intake/output data recorded.  Awake, alert. Mild/moderate cognitive deficits noted.  Oriented to person, month, [place].  FC x 4, no drift.  Lab Results: Recent Labs    02/03/20 1030 02/04/20 1046  WBC 9.1 7.8  HGB 13.4 12.7  HCT 40.0 38.1  PLT 272 220   BMET Recent Labs    02/03/20 1030 02/04/20 1046  NA 126* 137  K 3.4* 3.8  CL 90* 104  CO2 22 26  GLUCOSE 209* 156*  BUN 17 12  CREATININE 0.51 0.62  CALCIUM 9.1 9.1    Studies/Results: CT Code Stroke CTA Head W/WO contrast  Result Date: 02/04/2020 CLINICAL DATA:  Follow-up examination for acute encephalopathy. EXAM: CT ANGIOGRAPHY HEAD AND NECK CT PERFUSION BRAIN TECHNIQUE: Multidetector CT imaging of the head and neck was performed using the standard protocol during bolus administration of intravenous contrast. Multiplanar CT image reconstructions and MIPs were obtained to evaluate the vascular anatomy. Carotid stenosis measurements (when applicable) are obtained utilizing NASCET criteria, using the distal internal carotid diameter as the denominator. Multiphase CT imaging of the brain was performed following IV bolus contrast injection. Subsequent parametric perfusion maps were calculated using RAPID software. CONTRAST:  111mL OMNIPAQUE IOHEXOL 350 MG/ML SOLN COMPARISON:  Prior head CT from 02/03/2020 as well as brain MRI from earlier the same day. FINDINGS: CTA NECK FINDINGS Aortic arch: Visualized aortic arch of normal caliber with normal branch pattern.  Minimal plaque within the arch itself. No hemodynamically significant stenosis about the origin of the great vessels. Visualized subclavian arteries widely patent. Right carotid system: Right common carotid artery widely patent from its origin to the bifurcation without stenosis. Minimal centric calcified plaque at the right bifurcation without significant narrowing. Right ICA widely patent distally to the skull base without stenosis, dissection or occlusion. Left carotid system: Left common and internal carotid arteries widely patent without stenosis, dissection or occlusion. Vertebral arteries: Both vertebral arteries arise from the subclavian arteries. Vertebral arteries widely patent without stenosis, dissection or occlusion. Skeleton: No acute osseous abnormality. No discrete or worrisome osseous lesions. Moderate cervical spondylosis at C5-6 without high-grade stenosis. Focal sclerosis within the T4 vertebral body noted, nonspecific, but most likely benign. Other neck: No other acute soft tissue abnormality within the neck. No mass lesion or adenopathy. Upper chest: Scattered atelectatic changes noted within the visualized lungs. Visualized upper chest demonstrates no other acute finding. Review of the MIP images confirms the above findings CTA HEAD FINDINGS Anterior circulation: Internal carotid arteries patent to the termini without stenosis or other abnormality. A1 segments patent. Normal anterior communicator complex. Anterior cerebral arteries patent to their distal aspects without stenosis. No M1 stenosis or occlusion. Normal MCA bifurcations. Distal MCA branches well perfused and symmetric. Posterior circulation: Both vertebral arteries widely patent to the vertebrobasilar junction. Both picas patent. Basilar widely patent to its distal aspect without stenosis. Superior cerebellar and posterior cerebral arteries widely patent bilaterally. Venous sinuses: Grossly patent allowing for timing the contrast  bolus. Anatomic variants: None significant. Review of the MIP images confirms the above  findings CT Brain Perfusion Findings: ASPECTS: Not applicable. CBF (<30%) Volume: 29mL Perfusion (Tmax>6.0s) volume: 34mL Mismatch Volume: 30mL Infarction Location:Negative CT perfusion. IMPRESSION: 1. Normal CTA of the head and neck. No large vessel occlusion, hemodynamically significant stenosis, or other acute vascular abnormality. 2. Negative CT perfusion. Electronically Signed   By: Jeannine Boga M.D.   On: 02/04/2020 03:22   CT Code Stroke CTA Neck W/WO contrast  Result Date: 02/04/2020 CLINICAL DATA:  Follow-up examination for acute encephalopathy. EXAM: CT ANGIOGRAPHY HEAD AND NECK CT PERFUSION BRAIN TECHNIQUE: Multidetector CT imaging of the head and neck was performed using the standard protocol during bolus administration of intravenous contrast. Multiplanar CT image reconstructions and MIPs were obtained to evaluate the vascular anatomy. Carotid stenosis measurements (when applicable) are obtained utilizing NASCET criteria, using the distal internal carotid diameter as the denominator. Multiphase CT imaging of the brain was performed following IV bolus contrast injection. Subsequent parametric perfusion maps were calculated using RAPID software. CONTRAST:  129mL OMNIPAQUE IOHEXOL 350 MG/ML SOLN COMPARISON:  Prior head CT from 02/03/2020 as well as brain MRI from earlier the same day. FINDINGS: CTA NECK FINDINGS Aortic arch: Visualized aortic arch of normal caliber with normal branch pattern. Minimal plaque within the arch itself. No hemodynamically significant stenosis about the origin of the great vessels. Visualized subclavian arteries widely patent. Right carotid system: Right common carotid artery widely patent from its origin to the bifurcation without stenosis. Minimal centric calcified plaque at the right bifurcation without significant narrowing. Right ICA widely patent distally to the skull base  without stenosis, dissection or occlusion. Left carotid system: Left common and internal carotid arteries widely patent without stenosis, dissection or occlusion. Vertebral arteries: Both vertebral arteries arise from the subclavian arteries. Vertebral arteries widely patent without stenosis, dissection or occlusion. Skeleton: No acute osseous abnormality. No discrete or worrisome osseous lesions. Moderate cervical spondylosis at C5-6 without high-grade stenosis. Focal sclerosis within the T4 vertebral body noted, nonspecific, but most likely benign. Other neck: No other acute soft tissue abnormality within the neck. No mass lesion or adenopathy. Upper chest: Scattered atelectatic changes noted within the visualized lungs. Visualized upper chest demonstrates no other acute finding. Review of the MIP images confirms the above findings CTA HEAD FINDINGS Anterior circulation: Internal carotid arteries patent to the termini without stenosis or other abnormality. A1 segments patent. Normal anterior communicator complex. Anterior cerebral arteries patent to their distal aspects without stenosis. No M1 stenosis or occlusion. Normal MCA bifurcations. Distal MCA branches well perfused and symmetric. Posterior circulation: Both vertebral arteries widely patent to the vertebrobasilar junction. Both picas patent. Basilar widely patent to its distal aspect without stenosis. Superior cerebellar and posterior cerebral arteries widely patent bilaterally. Venous sinuses: Grossly patent allowing for timing the contrast bolus. Anatomic variants: None significant. Review of the MIP images confirms the above findings CT Brain Perfusion Findings: ASPECTS: Not applicable. CBF (<30%) Volume: 40mL Perfusion (Tmax>6.0s) volume: 70mL Mismatch Volume: 38mL Infarction Location:Negative CT perfusion. IMPRESSION: 1. Normal CTA of the head and neck. No large vessel occlusion, hemodynamically significant stenosis, or other acute vascular abnormality.  2. Negative CT perfusion. Electronically Signed   By: Jeannine Boga M.D.   On: 02/04/2020 03:22   CT CHEST W CONTRAST  Result Date: 02/05/2020 CLINICAL DATA:  64 year old female with history of brain cancer. Staging examination. EXAM: CT CHEST, ABDOMEN, AND PELVIS WITH CONTRAST TECHNIQUE: Multidetector CT imaging of the chest, abdomen and pelvis was performed following the standard protocol during bolus administration of intravenous  contrast. CONTRAST:  140mL OMNIPAQUE IOHEXOL 300 MG/ML  SOLN COMPARISON:  No priors. FINDINGS: CT CHEST FINDINGS Cardiovascular: Heart size is normal. Small amount of pericardial fluid in a superior pericardial recess. No other significant pericardial thickening or calcification. Aortic atherosclerosis. No definite calcified atherosclerotic plaque identified in the coronary arteries. Mediastinum/Nodes: No pathologically enlarged mediastinal or hilar lymph nodes. Esophagus is unremarkable in appearance. No axillary lymphadenopathy. Lungs/Pleura: 3 mm subpleural nodule in the periphery of the right upper lobe (axial image 31 of series 5). No other larger more suspicious appearing pulmonary nodules or masses are noted. No acute consolidative airspace disease. Small amount of dependent atelectasis and/or scarring in the right lower lobe posteriorly. Trace left pleural effusion. No right pleural effusion. Musculoskeletal: Multiple old healed right-sided rib fractures. There are no aggressive appearing lytic or blastic lesions noted in the visualized portions of the skeleton. CT ABDOMEN PELVIS FINDINGS Hepatobiliary: No suspicious cystic or solid hepatic lesions. No intra or extrahepatic biliary ductal dilatation. High attenuation material filling the lumen of the gallbladder, likely related to vicarious excretion of contrast material from yesterday's contrast enhanced CT examinations. No surrounding inflammatory changes. Gallbladder is not distended. Pancreas: No pancreatic mass.  No pancreatic ductal dilatation. No pancreatic or peripancreatic fluid collections or inflammatory changes. Spleen: Unremarkable. Adrenals/Urinary Tract: Bilateral kidneys and adrenal glands are normal in appearance. No hydroureteronephrosis. Urinary bladder is filled with iodinated contrast material, and along the posterolateral aspect of the urinary bladder wall on the right side there are several small filling defects abutting the wall measuring up to 6 mm (sagittal image 56 of series 7). Stomach/Bowel: The appearance of the stomach is normal. No pathologic dilatation of small bowel or colon. Normal appendix. Vascular/Lymphatic: Aortic atherosclerosis, without evidence of aneurysm or dissection in the abdominal or pelvic vasculature. No lymphadenopathy noted in the abdomen or pelvis. Reproductive: Uterus and ovaries are unremarkable in appearance. Other: No significant volume of ascites.  No pneumoperitoneum. Musculoskeletal: There are no aggressive appearing lytic or blastic lesions noted in the visualized portions of the skeleton. IMPRESSION: 1. No definitive evidence to suggest metastatic disease to the chest, abdomen or pelvis. 2. Small filling defects in the posterolateral aspect of the urinary bladder on the right side. This could represent some debris, however, these are intimately associated with the bladder wall such that small urothelial lesions are difficult to entirely exclude. Referral to Urology for further evaluation is recommended in the near future. 3. Small 3 mm subpleural nodule in the periphery of the right upper lobe is nonspecific, but statistically likely a benign subpleural lymph node. No follow-up needed if patient is low-risk. Non-contrast chest CT can be considered in 12 months if patient is high-risk. This recommendation follows the consensus statement: Guidelines for Management of Incidental Pulmonary Nodules Detected on CT Images: From the Fleischner Society 2017; Radiology 2017;  284:228-243. 4. Trace left pleural effusion. 5. Aortic atherosclerosis. 6. Additional incidental findings, as above. Electronically Signed   By: Vinnie Langton M.D.   On: 02/05/2020 08:15   MR BRAIN W WO CONTRAST  Result Date: 02/04/2020 CLINICAL DATA:  Initial evaluation for acute encephalopathy, migraine, mass on prior head CT. EXAM: MRI HEAD WITHOUT AND WITH CONTRAST TECHNIQUE: Multiplanar, multiecho pulse sequences of the brain and surrounding structures were obtained without and with intravenous contrast. CONTRAST:  7.45mL GADAVIST GADOBUTROL 1 MMOL/ML IV SOLN COMPARISON:  Prior head CT from 02/03/2020. FINDINGS: Brain: There is an infiltrative mass centered at the anterior inter hemispheric fissure, with extension along the  fornices, septum pellucidum, and anterior genu of the corpus callosum. Lesion demonstrates heterogeneous T2/FLAIR signal intensity with fairly avid heterogeneous postcontrast enhancement. Exact measurements of this lesion difficult given location and infiltrative nature. Mild central restricted diffusion related to necrosis and/or hypercellularity. Extension into the left greater than right periventricular white matter. The adjacent frontal horns are somewhat compressed and irregular by the adjacent tumor. Mass effect on the adjacent foramen of Monro with associated lateral ventriculomegaly. No transependymal flow of CSF or herniation. No distant subarachnoid seeding. Trace right-to-left shift at the septum pellucidum. No other mass lesion or abnormal enhancement. No evidence for acute or subacute infarct. Gray-white matter differentiation maintained. No encephalomalacia to suggest chronic cortical infarction. No other evidence for acute or chronic intracranial hemorrhage. No made of a partially empty sella. Note made of a 1.8 cm arachnoid cyst at the left middle cranial fossa. Vascular: Major intracranial vascular flow voids are maintained. Skull and upper cervical spine:  Craniocervical junction within normal limits. Upper cervical spine normal. Bone marrow signal intensity within normal limits. No focal marrow replacing lesion. No scalp soft tissue abnormality. Sinuses/Orbits: Globes and orbital soft tissues within normal limits are otherwise clear. No significant mastoid effusion. Inner ear structures grossly normal. Noted within the ethmoidal air cells. Paranasal sinuses Other: None. IMPRESSION: 1. Interhemispheric mass centered at the genu of the corpus callosum, septum pellucidum, and fornices, with extension into the left greater than right periventricular white matter. 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 Lymphoma would be the primary differential consideration. 2. Secondary mass effect on the foramen of Monro with lateral ventriculomegaly. No transependymal flow of CSF, significant midline shift, or herniation. Electronically Signed   By: Jeannine Boga M.D.   On: 02/04/2020 02:37   CT ABDOMEN PELVIS W CONTRAST  Result Date: 02/05/2020 CLINICAL DATA:  64 year old female with history of brain cancer. Staging examination. EXAM: CT CHEST, ABDOMEN, AND PELVIS WITH CONTRAST TECHNIQUE: Multidetector CT imaging of the chest, abdomen and pelvis was performed following the standard protocol during bolus administration of intravenous contrast. CONTRAST:  121mL OMNIPAQUE IOHEXOL 300 MG/ML  SOLN COMPARISON:  No priors. FINDINGS: CT CHEST FINDINGS Cardiovascular: Heart size is normal. Small amount of pericardial fluid in a superior pericardial recess. No other significant pericardial thickening or calcification. Aortic atherosclerosis. No definite calcified atherosclerotic plaque identified in the coronary arteries. Mediastinum/Nodes: No pathologically enlarged mediastinal or hilar lymph nodes. Esophagus is unremarkable in appearance. No axillary lymphadenopathy. Lungs/Pleura: 3 mm subpleural nodule in the periphery of the right upper lobe (axial image 31 of  series 5). No other larger more suspicious appearing pulmonary nodules or masses are noted. No acute consolidative airspace disease. Small amount of dependent atelectasis and/or scarring in the right lower lobe posteriorly. Trace left pleural effusion. No right pleural effusion. Musculoskeletal: Multiple old healed right-sided rib fractures. There are no aggressive appearing lytic or blastic lesions noted in the visualized portions of the skeleton. CT ABDOMEN PELVIS FINDINGS Hepatobiliary: No suspicious cystic or solid hepatic lesions. No intra or extrahepatic biliary ductal dilatation. High attenuation material filling the lumen of the gallbladder, likely related to vicarious excretion of contrast material from yesterday's contrast enhanced CT examinations. No surrounding inflammatory changes. Gallbladder is not distended. Pancreas: No pancreatic mass. No pancreatic ductal dilatation. No pancreatic or peripancreatic fluid collections or inflammatory changes. Spleen: Unremarkable. Adrenals/Urinary Tract: Bilateral kidneys and adrenal glands are normal in appearance. No hydroureteronephrosis. Urinary bladder is filled with iodinated contrast material, and along the posterolateral aspect of the urinary bladder  wall on the right side there are several small filling defects abutting the wall measuring up to 6 mm (sagittal image 56 of series 7). Stomach/Bowel: The appearance of the stomach is normal. No pathologic dilatation of small bowel or colon. Normal appendix. Vascular/Lymphatic: Aortic atherosclerosis, without evidence of aneurysm or dissection in the abdominal or pelvic vasculature. No lymphadenopathy noted in the abdomen or pelvis. Reproductive: Uterus and ovaries are unremarkable in appearance. Other: No significant volume of ascites.  No pneumoperitoneum. Musculoskeletal: There are no aggressive appearing lytic or blastic lesions noted in the visualized portions of the skeleton. IMPRESSION: 1. No definitive  evidence to suggest metastatic disease to the chest, abdomen or pelvis. 2. Small filling defects in the posterolateral aspect of the urinary bladder on the right side. This could represent some debris, however, these are intimately associated with the bladder wall such that small urothelial lesions are difficult to entirely exclude. Referral to Urology for further evaluation is recommended in the near future. 3. Small 3 mm subpleural nodule in the periphery of the right upper lobe is nonspecific, but statistically likely a benign subpleural lymph node. No follow-up needed if patient is low-risk. Non-contrast chest CT can be considered in 12 months if patient is high-risk. This recommendation follows the consensus statement: Guidelines for Management of Incidental Pulmonary Nodules Detected on CT Images: From the Fleischner Society 2017; Radiology 2017; 284:228-243. 4. Trace left pleural effusion. 5. Aortic atherosclerosis. 6. Additional incidental findings, as above. Electronically Signed   By: Vinnie Langton M.D.   On: 02/05/2020 08:15   CT Code Stroke Cerebral Perfusion with contrast  Result Date: 02/04/2020 CLINICAL DATA:  Follow-up examination for acute encephalopathy. EXAM: CT ANGIOGRAPHY HEAD AND NECK CT PERFUSION BRAIN TECHNIQUE: Multidetector CT imaging of the head and neck was performed using the standard protocol during bolus administration of intravenous contrast. Multiplanar CT image reconstructions and MIPs were obtained to evaluate the vascular anatomy. Carotid stenosis measurements (when applicable) are obtained utilizing NASCET criteria, using the distal internal carotid diameter as the denominator. Multiphase CT imaging of the brain was performed following IV bolus contrast injection. Subsequent parametric perfusion maps were calculated using RAPID software. CONTRAST:  148mL OMNIPAQUE IOHEXOL 350 MG/ML SOLN COMPARISON:  Prior head CT from 02/03/2020 as well as brain MRI from earlier the same  day. FINDINGS: CTA NECK FINDINGS Aortic arch: Visualized aortic arch of normal caliber with normal branch pattern. Minimal plaque within the arch itself. No hemodynamically significant stenosis about the origin of the great vessels. Visualized subclavian arteries widely patent. Right carotid system: Right common carotid artery widely patent from its origin to the bifurcation without stenosis. Minimal centric calcified plaque at the right bifurcation without significant narrowing. Right ICA widely patent distally to the skull base without stenosis, dissection or occlusion. Left carotid system: Left common and internal carotid arteries widely patent without stenosis, dissection or occlusion. Vertebral arteries: Both vertebral arteries arise from the subclavian arteries. Vertebral arteries widely patent without stenosis, dissection or occlusion. Skeleton: No acute osseous abnormality. No discrete or worrisome osseous lesions. Moderate cervical spondylosis at C5-6 without high-grade stenosis. Focal sclerosis within the T4 vertebral body noted, nonspecific, but most likely benign. Other neck: No other acute soft tissue abnormality within the neck. No mass lesion or adenopathy. Upper chest: Scattered atelectatic changes noted within the visualized lungs. Visualized upper chest demonstrates no other acute finding. Review of the MIP images confirms the above findings CTA HEAD FINDINGS Anterior circulation: Internal carotid arteries patent to the termini without  stenosis or other abnormality. A1 segments patent. Normal anterior communicator complex. Anterior cerebral arteries patent to their distal aspects without stenosis. No M1 stenosis or occlusion. Normal MCA bifurcations. Distal MCA branches well perfused and symmetric. Posterior circulation: Both vertebral arteries widely patent to the vertebrobasilar junction. Both picas patent. Basilar widely patent to its distal aspect without stenosis. Superior cerebellar and  posterior cerebral arteries widely patent bilaterally. Venous sinuses: Grossly patent allowing for timing the contrast bolus. Anatomic variants: None significant. Review of the MIP images confirms the above findings CT Brain Perfusion Findings: ASPECTS: Not applicable. CBF (<30%) Volume: 14mL Perfusion (Tmax>6.0s) volume: 60mL Mismatch Volume: 30mL Infarction Location:Negative CT perfusion. IMPRESSION: 1. Normal CTA of the head and neck. No large vessel occlusion, hemodynamically significant stenosis, or other acute vascular abnormality. 2. Negative CT perfusion. Electronically Signed   By: Jeannine Boga M.D.   On: 02/04/2020 03:22   DG Chest Port 1 View  Result Date: 02/03/2020 CLINICAL DATA:  Altered mental status EXAM: PORTABLE CHEST 1 VIEW COMPARISON:  Partial comparison to limited CT chest dated 03/08/2012 FINDINGS: Mild increased interstitial markings. Left lower lobe opacity, likely atelectasis. No pleural effusion or pneumothorax. The heart is normal in size. Old right 3rd and 4th rib fracture deformities. IMPRESSION: Mild left lower lobe opacity, likely atelectasis. Electronically Signed   By: Julian Hy M.D.   On: 02/03/2020 11:41   EEG adult  Result Date: 02/04/2020 Lora Havens, MD     02/04/2020  1:21 PM Patient Name: Alicia Mcdonald MRN: [id number] Epilepsy Attending: Lora Havens Referring Physician/Provider: Dr. Heath Lark Date: 02/04/2020 Duration: 25.33 mins Patient history: 64 year old female presented with nausea vomiting headache and encephalopathy.  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  EEG evaluate for seizures. Level of alertness: awake, asleep AEDs during EEG study: Keppra Technical aspects: This EEG study was done with scalp electrodes positioned according to the 10-20 International system of electrode placement. Electrical activity was acquired at a sampling rate of 500Hz  and reviewed with a high frequency filter of 70Hz  and  a low frequency filter of 1Hz . EEG data were recorded continuously and digitally stored. Description: The posterior dominant rhythm consists of 9-10 Hz activity of moderate voltage (25-35 uV) seen predominantly in posterior head regions, symmetric and reactive to eye opening and eye closing. Sleep was characterized by vertex waves, sleep spindles (12 to 14 Hz), maximal frontocentral region. EEG showed continuous generalized 5-6 Hz theta as well as intermittent 2-3Hz  delta slowing. Hyperventilation and photic stimulation were not performed.   ABNORMALITY -Continuous slow, generalized IMPRESSION: This study is suggestive of mild to moderate diffuse encephalopathy, nonspecific etiology. No seizures or epileptiform discharges were seen throughout the recording. 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 - planning for stereotactic biopsy tomorrow - her neurologic exam is significantly improved today.  Unclear if she was in a postictal state.  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  I am also planning possible septostomy and EVD placement tomorrow with her biopsy.   Vallarie Mare 02/05/2020, 11:00 AM

## 2020-02-05 NOTE — Progress Notes (Signed)
PROGRESS NOTE    Alicia Mcdonald  [id number] DOB: 04/19/1956 DOA: 02/03/2020 PCP: Redmond School, MD   Brief Narrative:Alicia R Myersis a 64 y.o.femalewith medical history significant forvitamin D deficiency who was brought to the ED via EMS after her mother found her in the bathroom with significant confusion. She was also noted to have vomited. Apparently patient has been having severe headaches for several days and at the time of EMS arrival her speech was quite incoherent. No other symptoms of fevers, chills, chest pain, shortness of breath, cough, abdominal pain, or diarrhea reported by the mother. No specific weakness or numbness to the extremities or visual field deficits have been noted.Patient is currently quite somnolent and encephalopathic and history could not be obtained directly. I have called the mother who is the primary historian. No other significant chronic medical issues are noted.  ED Course:Stable vital signs are noted and patient has EKG in sinus rhythm. Serum sodium is 126 and potassium is 3.4 with glucose of 209. Her 1 view chest x-ray demonstrates some left lower lobe opacity which appears to reflect atelectasis and Covid testing is pending. CT scanning of the head was immediately performed on arrival under code stroke protocol and she is noted to have an anterior hemispheric brain mass suspicious for glioma versus lymphoma. Teleneurology suspects that patient may have had a seizure and thus she has been loaded with Keppra and given some Ativan. She has also been started on IV dexamethasone 10 mg and recommendations have been made for brain MRI as well as EEG evaluation. EDP has also discussed case with neurosurgery Dr. Christella Noa who recommends transfer to [facility] for further evaluation.  Assessment & Plan:   Active Problems:   Seizure ([place])   Altered mental status   Brain neoplasm ([place])  #1 brain mass-MRI showsInterhemispheric mass centered at the genu  of the corpus callosum, septum pellucidum, and fornices, with extension into the left greater than right periventricular white matter. 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 Lymphoma would be the primary differential consideration.  Secondary mass effect on the foramen of Monro with lateral ventriculomegaly. No transependymal flow of CSF, significant midline shift, or herniation.  CT angiogram of the head and neck with no large vessel occlusion or hemodynamically significant stenosis, negative CT perfusion.  Continue Keppra and as needed Ativan  Patient was on Decadron which has been stopped since it may interfere with the lymphoma histology from biopsy. Patient scheduled for stereotactic biopsy, possible septostomy and EVD placement tomorrow 02/06/2020 by Dr. Marcello Moores.  #2 post ictal status post seizure-patient is more awake alert and oriented today compared to yesterday she was encephalopathic likely postictal.  Continue Keppra.   EEG  #3 hyponatremia resolved sodium 137  #4 DM with hemoglobin A1c of 6.8 no prior diagnosis of diabetes.  Continue SSI.   Estimated body mass index is 23.57 kg/m as calculated from the following:   Height as of this encounter: 5\' 8"  (1.727 m).   Weight as of this encounter: 70.3 kg.  DVT prophylaxis: SCD Code Status: Full code Family Communication: Discussed with mother and brother at bedside  disposition Plan:  Status is: Inpatient  Dispo: The patient is from: home  Anticipated d/c is to: unknown  Anticipated d/c date is: unknown  Patient currently is not medically stable to d/c.  Patient admitted with new onset seizures and brain mass plan for biopsy and ventriculostomy drain tomorrow.  Consultants: neuro neuro surgery dr Mickeal Skinner   Procedures: none Antimicrobials:none  Subjective: Patient resting in bed awake and alert moves all extremities he is oriented to hospital curious and anxious  about how long she has to stay in the hospital  Objective: Vitals:   02/04/20 1958 02/04/20 2345 02/05/20 0330 02/05/20 0834  BP: 123/75 121/73 128/70 (!) 144/71  Pulse: 66 61 66 69  Resp: 18 18 18 17   Temp: 98.3 F (36.8 C) 98 F (36.7 C) 98.3 F (36.8 C) 98 F (36.7 C)  TempSrc: Oral Oral Oral Oral  SpO2: 96% 98% 98%   Weight:      Height:        Intake/Output Summary (Last 24 hours) at 02/05/2020 1255 Last data filed at 02/05/2020 0617 Gross per 24 hour  Intake 610 ml  Output 1300 ml  Net -690 ml   Filed Weights   02/03/20 1008  Weight: 70.3 kg    Examination:  General exam: Appears calm and comfortable  Respiratory system: Clear to auscultation. Respiratory effort normal. Cardiovascular system: S1 & S2 heard, RRR. No JVD, murmurs, rubs, gallops or clicks. No pedal edema. Gastrointestinal system: Abdomen is nondistended, soft and nontender. No organomegaly or masses felt. Normal bowel sounds heard. Central nervous system: Alert and oriented. No focal neurological deficits. Extremities: Symmetric 5 x 5 power. Skin: No rashes, lesions or ulcers Psychiatry: Judgement and insight appear normal. Mood & affect appropriate.     Data Reviewed: I have personally reviewed following labs and imaging studies  CBC: Recent Labs  Lab 02/03/20 1030 02/04/20 1046  WBC 9.1 7.8  NEUTROABS 8.3*  --   HGB 13.4 12.7  HCT 40.0 38.1  MCV 88.7 89.4  PLT 272 [id number]   Basic Metabolic Panel: Recent Labs  Lab 02/03/20 1030 02/04/20 1046  NA 126* 137  K 3.4* 3.8  CL 90* 104  CO2 22 26  GLUCOSE 209* 156*  BUN 17 12  CREATININE 0.51 0.62  CALCIUM 9.1 9.1   GFR: Estimated Creatinine Clearance: 71.7 mL/min (by C-G formula based on SCr of 0.62 mg/dL). Liver Function Tests: Recent Labs  Lab 02/03/20 1030 02/04/20 1046  AST 26 21  ALT 26 23  ALKPHOS 70 57  BILITOT 0.9 0.8  PROT 7.2 6.0*  ALBUMIN 4.4 3.3*   No results for input(s): LIPASE, AMYLASE in the last 168  hours. No results for input(s): AMMONIA in the last 168 hours. Coagulation Profile: Recent Labs  Lab 02/03/20 1030  INR 1.0   Cardiac Enzymes: No results for input(s): CKTOTAL, CKMB, CKMBINDEX, TROPONINI in the last 168 hours. BNP (last 3 results) No results for input(s): PROBNP in the last 8760 hours. HbA1C: Recent Labs    02/03/20 1510  HGBA1C 6.8*   CBG: Recent Labs  Lab 02/04/20 1957 02/04/20 2338 02/05/20 0329 02/05/20 0834 02/05/20 1147  GLUCAP 115* 109* 118* 87 99   Lipid Profile: No results for input(s): CHOL, HDL, LDLCALC, TRIG, CHOLHDL, LDLDIRECT in the last 72 hours. Thyroid Function Tests: Recent Labs    02/03/20 1510  TSH 0.646   Anemia Panel: No results for input(s): VITAMINB12, FOLATE, FERRITIN, TIBC, IRON, RETICCTPCT in the last 72 hours. Sepsis Labs: No results for input(s): PROCALCITON, LATICACIDVEN in the last 168 hours.  Recent Results (from the past 240 hour(s))  SARS Coronavirus 2 by RT PCR (hospital order, performed in [facility] hospital lab) Nasopharyngeal Nasopharyngeal Swab     Status: None   Collection Time: 02/03/20 10:29 AM   Specimen: Nasopharyngeal Swab  Result Value Ref Range Status  SARS Coronavirus 2 NEGATIVE NEGATIVE Final    Comment: (NOTE) SARS-CoV-2 target nucleic acids are NOT DETECTED. The SARS-CoV-2 RNA is generally detectable in upper and lower respiratory specimens during the acute phase of infection. The lowest concentration of SARS-CoV-2 viral copies this assay can detect is 250 copies / mL. A negative result does not preclude SARS-CoV-2 infection and should not be used as the sole basis for treatment or other patient management decisions.  A negative result may occur with improper specimen collection / handling, submission of specimen other than nasopharyngeal swab, presence of viral mutation(s) within the areas targeted by this assay, and inadequate number of viral copies (<250 copies / mL). A negative result  must be combined with clinical observations, patient history, and epidemiological information. Fact Sheet for Patients:   StrictlyIdeas.no Fact Sheet for Healthcare Providers: BankingDealers.co.za This test is not yet approved or cleared  by the [place] FDA and has been authorized for detection and/or diagnosis of SARS-CoV-2 by FDA under an Emergency Use Authorization (EUA).  This EUA will remain in effect (meaning this test can be used) for the duration of the COVID-19 declaration under Section 564(b)(1) of the Act, 21 U.S.C. section 360bbb-3(b)(1), unless the authorization is terminated or revoked sooner. Performed at [facility], [address] [place], [place] [address]          Radiology Studies: CT Code Stroke CTA Head W/WO contrast  Result Date: 02/04/2020 CLINICAL DATA:  Follow-up examination for acute encephalopathy. EXAM: CT ANGIOGRAPHY HEAD AND NECK CT PERFUSION BRAIN TECHNIQUE: Multidetector CT imaging of the head and neck was performed using the standard protocol during bolus administration of intravenous contrast. Multiplanar CT image reconstructions and MIPs were obtained to evaluate the vascular anatomy. Carotid stenosis measurements (when applicable) are obtained utilizing NASCET criteria, using the distal internal carotid diameter as the denominator. Multiphase CT imaging of the brain was performed following IV bolus contrast injection. Subsequent parametric perfusion maps were calculated using RAPID software. CONTRAST:  175mL OMNIPAQUE IOHEXOL 350 MG/ML SOLN COMPARISON:  Prior head CT from 02/03/2020 as well as brain MRI from earlier the same day. FINDINGS: CTA NECK FINDINGS Aortic arch: Visualized aortic arch of normal caliber with normal branch pattern. Minimal plaque within the arch itself. No hemodynamically significant stenosis about the origin of the great vessels. Visualized subclavian arteries widely patent. Right  carotid system: Right common carotid artery widely patent from its origin to the bifurcation without stenosis. Minimal centric calcified plaque at the right bifurcation without significant narrowing. Right ICA widely patent distally to the skull base without stenosis, dissection or occlusion. Left carotid system: Left common and internal carotid arteries widely patent without stenosis, dissection or occlusion. Vertebral arteries: Both vertebral arteries arise from the subclavian arteries. Vertebral arteries widely patent without stenosis, dissection or occlusion. Skeleton: No acute osseous abnormality. No discrete or worrisome osseous lesions. Moderate cervical spondylosis at C5-6 without high-grade stenosis. Focal sclerosis within the T4 vertebral body noted, nonspecific, but most likely benign. Other neck: No other acute soft tissue abnormality within the neck. No mass lesion or adenopathy. Upper chest: Scattered atelectatic changes noted within the visualized lungs. Visualized upper chest demonstrates no other acute finding. Review of the MIP images confirms the above findings CTA HEAD FINDINGS Anterior circulation: Internal carotid arteries patent to the termini without stenosis or other abnormality. A1 segments patent. Normal anterior communicator complex. Anterior cerebral arteries patent to their distal aspects without stenosis. No M1 stenosis or occlusion. Normal MCA bifurcations. Distal MCA branches  well perfused and symmetric. Posterior circulation: Both vertebral arteries widely patent to the vertebrobasilar junction. Both picas patent. Basilar widely patent to its distal aspect without stenosis. Superior cerebellar and posterior cerebral arteries widely patent bilaterally. Venous sinuses: Grossly patent allowing for timing the contrast bolus. Anatomic variants: None significant. Review of the MIP images confirms the above findings CT Brain Perfusion Findings: ASPECTS: Not applicable. CBF (<30%) Volume:  64mL Perfusion (Tmax>6.0s) volume: 68mL Mismatch Volume: 27mL Infarction Location:Negative CT perfusion. IMPRESSION: 1. Normal CTA of the head and neck. No large vessel occlusion, hemodynamically significant stenosis, or other acute vascular abnormality. 2. Negative CT perfusion. Electronically Signed   By: Jeannine Boga M.D.   On: 02/04/2020 03:22   CT Code Stroke CTA Neck W/WO contrast  Result Date: 02/04/2020 CLINICAL DATA:  Follow-up examination for acute encephalopathy. EXAM: CT ANGIOGRAPHY HEAD AND NECK CT PERFUSION BRAIN TECHNIQUE: Multidetector CT imaging of the head and neck was performed using the standard protocol during bolus administration of intravenous contrast. Multiplanar CT image reconstructions and MIPs were obtained to evaluate the vascular anatomy. Carotid stenosis measurements (when applicable) are obtained utilizing NASCET criteria, using the distal internal carotid diameter as the denominator. Multiphase CT imaging of the brain was performed following IV bolus contrast injection. Subsequent parametric perfusion maps were calculated using RAPID software. CONTRAST:  119mL OMNIPAQUE IOHEXOL 350 MG/ML SOLN COMPARISON:  Prior head CT from 02/03/2020 as well as brain MRI from earlier the same day. FINDINGS: CTA NECK FINDINGS Aortic arch: Visualized aortic arch of normal caliber with normal branch pattern. Minimal plaque within the arch itself. No hemodynamically significant stenosis about the origin of the great vessels. Visualized subclavian arteries widely patent. Right carotid system: Right common carotid artery widely patent from its origin to the bifurcation without stenosis. Minimal centric calcified plaque at the right bifurcation without significant narrowing. Right ICA widely patent distally to the skull base without stenosis, dissection or occlusion. Left carotid system: Left common and internal carotid arteries widely patent without stenosis, dissection or occlusion. Vertebral  arteries: Both vertebral arteries arise from the subclavian arteries. Vertebral arteries widely patent without stenosis, dissection or occlusion. Skeleton: No acute osseous abnormality. No discrete or worrisome osseous lesions. Moderate cervical spondylosis at C5-6 without high-grade stenosis. Focal sclerosis within the T4 vertebral body noted, nonspecific, but most likely benign. Other neck: No other acute soft tissue abnormality within the neck. No mass lesion or adenopathy. Upper chest: Scattered atelectatic changes noted within the visualized lungs. Visualized upper chest demonstrates no other acute finding. Review of the MIP images confirms the above findings CTA HEAD FINDINGS Anterior circulation: Internal carotid arteries patent to the termini without stenosis or other abnormality. A1 segments patent. Normal anterior communicator complex. Anterior cerebral arteries patent to their distal aspects without stenosis. No M1 stenosis or occlusion. Normal MCA bifurcations. Distal MCA branches well perfused and symmetric. Posterior circulation: Both vertebral arteries widely patent to the vertebrobasilar junction. Both picas patent. Basilar widely patent to its distal aspect without stenosis. Superior cerebellar and posterior cerebral arteries widely patent bilaterally. Venous sinuses: Grossly patent allowing for timing the contrast bolus. Anatomic variants: None significant. Review of the MIP images confirms the above findings CT Brain Perfusion Findings: ASPECTS: Not applicable. CBF (<30%) Volume: 68mL Perfusion (Tmax>6.0s) volume: 40mL Mismatch Volume: 98mL Infarction Location:Negative CT perfusion. IMPRESSION: 1. Normal CTA of the head and neck. No large vessel occlusion, hemodynamically significant stenosis, or other acute vascular abnormality. 2. Negative CT perfusion. Electronically Signed   By: Marland Kitchen  Jeannine Boga M.D.   On: 02/04/2020 03:22   CT CHEST W CONTRAST  Result Date: 02/05/2020 CLINICAL DATA:   64 year old female with history of brain cancer. Staging examination. EXAM: CT CHEST, ABDOMEN, AND PELVIS WITH CONTRAST TECHNIQUE: Multidetector CT imaging of the chest, abdomen and pelvis was performed following the standard protocol during bolus administration of intravenous contrast. CONTRAST:  15mL OMNIPAQUE IOHEXOL 300 MG/ML  SOLN COMPARISON:  No priors. FINDINGS: CT CHEST FINDINGS Cardiovascular: Heart size is normal. Small amount of pericardial fluid in a superior pericardial recess. No other significant pericardial thickening or calcification. Aortic atherosclerosis. No definite calcified atherosclerotic plaque identified in the coronary arteries. Mediastinum/Nodes: No pathologically enlarged mediastinal or hilar lymph nodes. Esophagus is unremarkable in appearance. No axillary lymphadenopathy. Lungs/Pleura: 3 mm subpleural nodule in the periphery of the right upper lobe (axial image 31 of series 5). No other larger more suspicious appearing pulmonary nodules or masses are noted. No acute consolidative airspace disease. Small amount of dependent atelectasis and/or scarring in the right lower lobe posteriorly. Trace left pleural effusion. No right pleural effusion. Musculoskeletal: Multiple old healed right-sided rib fractures. There are no aggressive appearing lytic or blastic lesions noted in the visualized portions of the skeleton. CT ABDOMEN PELVIS FINDINGS Hepatobiliary: No suspicious cystic or solid hepatic lesions. No intra or extrahepatic biliary ductal dilatation. High attenuation material filling the lumen of the gallbladder, likely related to vicarious excretion of contrast material from yesterday's contrast enhanced CT examinations. No surrounding inflammatory changes. Gallbladder is not distended. Pancreas: No pancreatic mass. No pancreatic ductal dilatation. No pancreatic or peripancreatic fluid collections or inflammatory changes. Spleen: Unremarkable. Adrenals/Urinary Tract: Bilateral kidneys  and adrenal glands are normal in appearance. No hydroureteronephrosis. Urinary bladder is filled with iodinated contrast material, and along the posterolateral aspect of the urinary bladder wall on the right side there are several small filling defects abutting the wall measuring up to 6 mm (sagittal image 56 of series 7). Stomach/Bowel: The appearance of the stomach is normal. No pathologic dilatation of small bowel or colon. Normal appendix. Vascular/Lymphatic: Aortic atherosclerosis, without evidence of aneurysm or dissection in the abdominal or pelvic vasculature. No lymphadenopathy noted in the abdomen or pelvis. Reproductive: Uterus and ovaries are unremarkable in appearance. Other: No significant volume of ascites.  No pneumoperitoneum. Musculoskeletal: There are no aggressive appearing lytic or blastic lesions noted in the visualized portions of the skeleton. IMPRESSION: 1. No definitive evidence to suggest metastatic disease to the chest, abdomen or pelvis. 2. Small filling defects in the posterolateral aspect of the urinary bladder on the right side. This could represent some debris, however, these are intimately associated with the bladder wall such that small urothelial lesions are difficult to entirely exclude. Referral to Urology for further evaluation is recommended in the near future. 3. Small 3 mm subpleural nodule in the periphery of the right upper lobe is nonspecific, but statistically likely a benign subpleural lymph node. No follow-up needed if patient is low-risk. Non-contrast chest CT can be considered in 12 months if patient is high-risk. This recommendation follows the consensus statement: Guidelines for Management of Incidental Pulmonary Nodules Detected on CT Images: From the Fleischner Society 2017; Radiology 2017; 284:228-243. 4. Trace left pleural effusion. 5. Aortic atherosclerosis. 6. Additional incidental findings, as above. Electronically Signed   By: Vinnie Langton M.D.   On:  02/05/2020 08:15   MR BRAIN W WO CONTRAST  Result Date: 02/04/2020 CLINICAL DATA:  Initial evaluation for acute encephalopathy, migraine, mass on prior head CT.  EXAM: MRI HEAD WITHOUT AND WITH CONTRAST TECHNIQUE: Multiplanar, multiecho pulse sequences of the brain and surrounding structures were obtained without and with intravenous contrast. CONTRAST:  7.55mL GADAVIST GADOBUTROL 1 MMOL/ML IV SOLN COMPARISON:  Prior head CT from 02/03/2020. FINDINGS: Brain: There is an infiltrative mass centered at the anterior inter hemispheric fissure, with extension along the fornices, septum pellucidum, and anterior genu of the corpus callosum. Lesion demonstrates heterogeneous T2/FLAIR signal intensity with fairly avid heterogeneous postcontrast enhancement. Exact measurements of this lesion difficult given location and infiltrative nature. Mild central restricted diffusion related to necrosis and/or hypercellularity. Extension into the left greater than right periventricular white matter. The adjacent frontal horns are somewhat compressed and irregular by the adjacent tumor. Mass effect on the adjacent foramen of Monro with associated lateral ventriculomegaly. No transependymal flow of CSF or herniation. No distant subarachnoid seeding. Trace right-to-left shift at the septum pellucidum. No other mass lesion or abnormal enhancement. No evidence for acute or subacute infarct. Gray-white matter differentiation maintained. No encephalomalacia to suggest chronic cortical infarction. No other evidence for acute or chronic intracranial hemorrhage. No made of a partially empty sella. Note made of a 1.8 cm arachnoid cyst at the left middle cranial fossa. Vascular: Major intracranial vascular flow voids are maintained. Skull and upper cervical spine: Craniocervical junction within normal limits. Upper cervical spine normal. Bone marrow signal intensity within normal limits. No focal marrow replacing lesion. No scalp soft tissue  abnormality. Sinuses/Orbits: Globes and orbital soft tissues within normal limits are otherwise clear. No significant mastoid effusion. Inner ear structures grossly normal. Noted within the ethmoidal air cells. Paranasal sinuses Other: None. IMPRESSION: 1. Interhemispheric mass centered at the genu of the corpus callosum, septum pellucidum, and fornices, with extension into the left greater than right periventricular white matter. 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 Lymphoma would be the primary differential consideration. 2. Secondary mass effect on the foramen of Monro with lateral ventriculomegaly. No transependymal flow of CSF, significant midline shift, or herniation. Electronically Signed   By: Jeannine Boga M.D.   On: 02/04/2020 02:37   CT ABDOMEN PELVIS W CONTRAST  Result Date: 02/05/2020 CLINICAL DATA:  64 year old female with history of brain cancer. Staging examination. EXAM: CT CHEST, ABDOMEN, AND PELVIS WITH CONTRAST TECHNIQUE: Multidetector CT imaging of the chest, abdomen and pelvis was performed following the standard protocol during bolus administration of intravenous contrast. CONTRAST:  146mL OMNIPAQUE IOHEXOL 300 MG/ML  SOLN COMPARISON:  No priors. FINDINGS: CT CHEST FINDINGS Cardiovascular: Heart size is normal. Small amount of pericardial fluid in a superior pericardial recess. No other significant pericardial thickening or calcification. Aortic atherosclerosis. No definite calcified atherosclerotic plaque identified in the coronary arteries. Mediastinum/Nodes: No pathologically enlarged mediastinal or hilar lymph nodes. Esophagus is unremarkable in appearance. No axillary lymphadenopathy. Lungs/Pleura: 3 mm subpleural nodule in the periphery of the right upper lobe (axial image 31 of series 5). No other larger more suspicious appearing pulmonary nodules or masses are noted. No acute consolidative airspace disease. Small amount of dependent atelectasis and/or  scarring in the right lower lobe posteriorly. Trace left pleural effusion. No right pleural effusion. Musculoskeletal: Multiple old healed right-sided rib fractures. There are no aggressive appearing lytic or blastic lesions noted in the visualized portions of the skeleton. CT ABDOMEN PELVIS FINDINGS Hepatobiliary: No suspicious cystic or solid hepatic lesions. No intra or extrahepatic biliary ductal dilatation. High attenuation material filling the lumen of the gallbladder, likely related to vicarious excretion of contrast material from yesterday's contrast enhanced  CT examinations. No surrounding inflammatory changes. Gallbladder is not distended. Pancreas: No pancreatic mass. No pancreatic ductal dilatation. No pancreatic or peripancreatic fluid collections or inflammatory changes. Spleen: Unremarkable. Adrenals/Urinary Tract: Bilateral kidneys and adrenal glands are normal in appearance. No hydroureteronephrosis. Urinary bladder is filled with iodinated contrast material, and along the posterolateral aspect of the urinary bladder wall on the right side there are several small filling defects abutting the wall measuring up to 6 mm (sagittal image 56 of series 7). Stomach/Bowel: The appearance of the stomach is normal. No pathologic dilatation of small bowel or colon. Normal appendix. Vascular/Lymphatic: Aortic atherosclerosis, without evidence of aneurysm or dissection in the abdominal or pelvic vasculature. No lymphadenopathy noted in the abdomen or pelvis. Reproductive: Uterus and ovaries are unremarkable in appearance. Other: No significant volume of ascites.  No pneumoperitoneum. Musculoskeletal: There are no aggressive appearing lytic or blastic lesions noted in the visualized portions of the skeleton. IMPRESSION: 1. No definitive evidence to suggest metastatic disease to the chest, abdomen or pelvis. 2. Small filling defects in the posterolateral aspect of the urinary bladder on the right side. This could  represent some debris, however, these are intimately associated with the bladder wall such that small urothelial lesions are difficult to entirely exclude. Referral to Urology for further evaluation is recommended in the near future. 3. Small 3 mm subpleural nodule in the periphery of the right upper lobe is nonspecific, but statistically likely a benign subpleural lymph node. No follow-up needed if patient is low-risk. Non-contrast chest CT can be considered in 12 months if patient is high-risk. This recommendation follows the consensus statement: Guidelines for Management of Incidental Pulmonary Nodules Detected on CT Images: From the Fleischner Society 2017; Radiology 2017; 284:228-243. 4. Trace left pleural effusion. 5. Aortic atherosclerosis. 6. Additional incidental findings, as above. Electronically Signed   By: Vinnie Langton M.D.   On: 02/05/2020 08:15   CT Code Stroke Cerebral Perfusion with contrast  Result Date: 02/04/2020 CLINICAL DATA:  Follow-up examination for acute encephalopathy. EXAM: CT ANGIOGRAPHY HEAD AND NECK CT PERFUSION BRAIN TECHNIQUE: Multidetector CT imaging of the head and neck was performed using the standard protocol during bolus administration of intravenous contrast. Multiplanar CT image reconstructions and MIPs were obtained to evaluate the vascular anatomy. Carotid stenosis measurements (when applicable) are obtained utilizing NASCET criteria, using the distal internal carotid diameter as the denominator. Multiphase CT imaging of the brain was performed following IV bolus contrast injection. Subsequent parametric perfusion maps were calculated using RAPID software. CONTRAST:  18mL OMNIPAQUE IOHEXOL 350 MG/ML SOLN COMPARISON:  Prior head CT from 02/03/2020 as well as brain MRI from earlier the same day. FINDINGS: CTA NECK FINDINGS Aortic arch: Visualized aortic arch of normal caliber with normal branch pattern. Minimal plaque within the arch itself. No hemodynamically  significant stenosis about the origin of the great vessels. Visualized subclavian arteries widely patent. Right carotid system: Right common carotid artery widely patent from its origin to the bifurcation without stenosis. Minimal centric calcified plaque at the right bifurcation without significant narrowing. Right ICA widely patent distally to the skull base without stenosis, dissection or occlusion. Left carotid system: Left common and internal carotid arteries widely patent without stenosis, dissection or occlusion. Vertebral arteries: Both vertebral arteries arise from the subclavian arteries. Vertebral arteries widely patent without stenosis, dissection or occlusion. Skeleton: No acute osseous abnormality. No discrete or worrisome osseous lesions. Moderate cervical spondylosis at C5-6 without high-grade stenosis. Focal sclerosis within the T4 vertebral body noted, nonspecific, but  most likely benign. Other neck: No other acute soft tissue abnormality within the neck. No mass lesion or adenopathy. Upper chest: Scattered atelectatic changes noted within the visualized lungs. Visualized upper chest demonstrates no other acute finding. Review of the MIP images confirms the above findings CTA HEAD FINDINGS Anterior circulation: Internal carotid arteries patent to the termini without stenosis or other abnormality. A1 segments patent. Normal anterior communicator complex. Anterior cerebral arteries patent to their distal aspects without stenosis. No M1 stenosis or occlusion. Normal MCA bifurcations. Distal MCA branches well perfused and symmetric. Posterior circulation: Both vertebral arteries widely patent to the vertebrobasilar junction. Both picas patent. Basilar widely patent to its distal aspect without stenosis. Superior cerebellar and posterior cerebral arteries widely patent bilaterally. Venous sinuses: Grossly patent allowing for timing the contrast bolus. Anatomic variants: None significant. Review of the  MIP images confirms the above findings CT Brain Perfusion Findings: ASPECTS: Not applicable. CBF (<30%) Volume: 70mL Perfusion (Tmax>6.0s) volume: 60mL Mismatch Volume: 21mL Infarction Location:Negative CT perfusion. IMPRESSION: 1. Normal CTA of the head and neck. No large vessel occlusion, hemodynamically significant stenosis, or other acute vascular abnormality. 2. Negative CT perfusion. Electronically Signed   By: Jeannine Boga M.D.   On: 02/04/2020 03:22   EEG adult  Result Date: 02/04/2020 Lora Havens, MD     02/04/2020  1:21 PM Patient Name: TYLIYAH WAITS MRN: [id number] Epilepsy Attending: Lora Havens Referring Physician/Provider: Dr. Heath Lark Date: 02/04/2020 Duration: 25.33 mins Patient history: 64 year old female presented with nausea vomiting headache and encephalopathy.  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  EEG evaluate for seizures. Level of alertness: awake, asleep AEDs during EEG study: Keppra Technical aspects: This EEG study was done with scalp electrodes positioned according to the 10-20 International system of electrode placement. Electrical activity was acquired at a sampling rate of 500Hz  and reviewed with a high frequency filter of 70Hz  and a low frequency filter of 1Hz . EEG data were recorded continuously and digitally stored. Description: The posterior dominant rhythm consists of 9-10 Hz activity of moderate voltage (25-35 uV) seen predominantly in posterior head regions, symmetric and reactive to eye opening and eye closing. Sleep was characterized by vertex waves, sleep spindles (12 to 14 Hz), maximal frontocentral region. EEG showed continuous generalized 5-6 Hz theta as well as intermittent 2-3Hz  delta slowing. Hyperventilation and photic stimulation were not performed.   ABNORMALITY -Continuous slow, generalized IMPRESSION: This study is suggestive of mild to moderate diffuse encephalopathy, nonspecific etiology. No seizures  or epileptiform discharges were seen throughout the recording. Priyanka Barbra Sarks        Scheduled Meds: . insulin aspart  0-9 Units Subcutaneous Q4H   Continuous Infusions: . sodium chloride 75 mL/hr at 02/05/20 1058  . levETIRAcetam 500 mg (02/04/20 2335)     LOS: 2 days      Georgette Shell, MD 02/05/2020, 12:55 PM

## 2020-02-05 NOTE — Progress Notes (Signed)
PT Cancellation Note  Patient Details Name: Alicia Mcdonald MRN: [id number] DOB: Feb 22, 1956   Cancelled Treatment:    Reason Eval/Treat Not Completed: Other (comment)  Noted plans for biopsy and EVD on 02/06/20. Will defer evaluation until after procedure as her functional status may change with procedure.   Please re-order PT post-procedure if appropriate.   Thank you  Arby Barrette, PT Pager [phone]   Rexanne Mano 02/05/2020, 11:46 AM

## 2020-02-05 NOTE — Plan of Care (Signed)

## 2020-02-06 ENCOUNTER — Encounter (HOSPITAL_COMMUNITY): Payer: Self-pay | Admitting: Internal Medicine

## 2020-02-06 ENCOUNTER — Inpatient Hospital Stay (HOSPITAL_COMMUNITY): Payer: BC Managed Care – PPO | Admitting: Certified Registered Nurse Anesthetist

## 2020-02-06 ENCOUNTER — Inpatient Hospital Stay (HOSPITAL_COMMUNITY)
Admission: EM | Disposition: A | Discharge status: Home / Self Care | Payer: Self-pay | Source: Home / Self Care | Attending: Internal Medicine

## 2020-02-06 HISTORY — PX: VENTRICULOSTOMY: SHX5377

## 2020-02-06 HISTORY — PX: FRAMELESS  BIOPSY WITH BRAINLAB: SHX6879

## 2020-02-06 HISTORY — PX: APPLICATION OF CRANIAL NAVIGATION: SHX6578

## 2020-02-06 LAB — CBC WITH DIFFERENTIAL/PLATELET
Abs Immature Granulocytes: 0.05 10*3/uL (ref 0.00–0.07)
Basophils Absolute: 0 10*3/uL (ref 0.0–0.1)
Basophils Relative: 0 %
Eosinophils Absolute: 0 10*3/uL (ref 0.0–0.5)
Eosinophils Relative: 0 %
HCT: 41.5 % (ref 36.0–46.0)
Hemoglobin: 13.6 g/dL (ref 12.0–15.0)
Immature Granulocytes: 1 %
Lymphocytes Relative: 6 %
Lymphs Abs: 0.6 10*3/uL — ABNORMAL LOW (ref 0.7–4.0)
MCH: 29.7 pg (ref 26.0–34.0)
MCHC: 32.8 g/dL (ref 30.0–36.0)
MCV: 90.6 fL (ref 80.0–100.0)
Monocytes Absolute: 0.4 10*3/uL (ref 0.1–1.0)
Monocytes Relative: 4 %
Neutro Abs: 9.6 10*3/uL — ABNORMAL HIGH (ref 1.7–7.7)
Neutrophils Relative %: 89 %
Platelets: 232 10*3/uL (ref 150–400)
RBC: 4.58 MIL/uL (ref 3.87–5.11)
RDW: 12.7 % (ref 11.5–15.5)
WBC: 10.7 10*3/uL — ABNORMAL HIGH (ref 4.0–10.5)
nRBC: 0 % (ref 0.0–0.2)

## 2020-02-06 LAB — POCT I-STAT 7, (LYTES, BLD GAS, ICA,H+H)
Acid-Base Excess: 6 mmol/L — ABNORMAL HIGH (ref 0.0–2.0)
Acid-Base Excess: 0 mmol/L (ref 0.0–2.0)
Bicarbonate: 29.9 mmol/L — ABNORMAL HIGH (ref 20.0–28.0)
Bicarbonate: 23.1 mmol/L (ref 20.0–28.0)
Calcium, Ion: 1.15 mmol/L (ref 1.15–1.40)
Calcium, Ion: 1.16 mmol/L (ref 1.15–1.40)
HCT: 36 % (ref 36.0–46.0)
HCT: 36 % (ref 36.0–46.0)
Hemoglobin: 12.2 g/dL (ref 12.0–15.0)
Hemoglobin: 12.2 g/dL (ref 12.0–15.0)
O2 Saturation: 100 %
O2 Saturation: 100 %
Patient temperature: 35.7
Patient temperature: 35.6
Potassium: 3.2 mmol/L — ABNORMAL LOW (ref 3.5–5.1)
Potassium: 3.1 mmol/L — ABNORMAL LOW (ref 3.5–5.1)
Sodium: 141 mmol/L (ref 135–145)
Sodium: 141 mmol/L (ref 135–145)
TCO2: 31 mmol/L (ref 22–32)
TCO2: 24 mmol/L (ref 22–32)
pCO2 arterial: 38.4 mmHg (ref 32.0–48.0)
pCO2 arterial: 30.8 mmHg — ABNORMAL LOW (ref 32.0–48.0)
pH, Arterial: 7.495 — ABNORMAL HIGH (ref 7.350–7.450)
pH, Arterial: 7.478 — ABNORMAL HIGH (ref 7.350–7.450)
pO2, Arterial: 453 mmHg — ABNORMAL HIGH (ref 83.0–108.0)
pO2, Arterial: 214 mmHg — ABNORMAL HIGH (ref 83.0–108.0)

## 2020-02-06 LAB — GLUCOSE, CAPILLARY
Glucose-Capillary: 198 mg/dL — ABNORMAL HIGH (ref 70–99)
Glucose-Capillary: 232 mg/dL — ABNORMAL HIGH (ref 70–99)
Glucose-Capillary: 80 mg/dL (ref 70–99)
Glucose-Capillary: 85 mg/dL (ref 70–99)

## 2020-02-06 LAB — PROTEIN AND GLUCOSE, CSF
Glucose, CSF: 62 mg/dL (ref 40–70)
Total  Protein, CSF: 25 mg/dL (ref 15–45)

## 2020-02-06 LAB — CSF CELL COUNT WITH DIFFERENTIAL
RBC Count, CSF: 2090 /mm3 — ABNORMAL HIGH
Tube #: 1
WBC, CSF: 8 /mm3 — ABNORMAL HIGH (ref 0–5)

## 2020-02-06 LAB — SURGICAL PCR SCREEN
MRSA, PCR: NEGATIVE
Staphylococcus aureus: NEGATIVE

## 2020-02-06 LAB — BASIC METABOLIC PANEL
Anion gap: 13 (ref 5–15)
BUN: 10 mg/dL (ref 8–23)
CO2: 23 mmol/L (ref 22–32)
Calcium: 8.7 mg/dL — ABNORMAL LOW (ref 8.9–10.3)
Chloride: 102 mmol/L (ref 98–111)
Creatinine, Ser: 0.57 mg/dL (ref 0.44–1.00)
GFR calc Af Amer: >60 mL/min (ref >60)
GFR calc non Af Amer: >60 mL/min (ref >60)
Glucose, Bld: 85 mg/dL (ref 70–99)
Potassium: 3 mmol/L — ABNORMAL LOW (ref 3.5–5.1)
Sodium: 138 mmol/L (ref 135–145)

## 2020-02-06 LAB — MAGNESIUM: Magnesium: 1.9 mg/dL (ref 1.7–2.4)

## 2020-02-06 LAB — ABO/RH: ABO/RH(D): O POS

## 2020-02-06 LAB — TYPE AND SCREEN
ABO/RH(D): O POS
Antibody Screen: NEGATIVE

## 2020-02-06 SURGERY — FRAMELESS BIOPSY WITH BRAINLAB
Anesthesia: General | Site: Head

## 2020-02-06 MED ORDER — ONDANSETRON HCL 4 MG/2ML IJ SOLN: 4.0000 mg | Freq: Once | INTRAMUSCULAR | Status: DC | PRN

## 2020-02-06 MED ORDER — 0.9 % SODIUM CHLORIDE (POUR BTL) OPTIME
TOPICAL | Status: DC | PRN
  Administered 2020-02-06 (×3): 1000 mL

## 2020-02-06 MED ORDER — ROCURONIUM BROMIDE 10 MG/ML (PF) SYRINGE
PREFILLED_SYRINGE | INTRAVENOUS | Status: DC | PRN
  Administered 2020-02-06 (×2): 50 mg via INTRAVENOUS

## 2020-02-06 MED ORDER — PHENYLEPHRINE 40 MCG/ML (10ML) SYRINGE FOR IV PUSH (FOR BLOOD PRESSURE SUPPORT)
PREFILLED_SYRINGE | INTRAVENOUS | Status: DC | PRN
  Administered 2020-02-06 (×3): 80 ug via INTRAVENOUS

## 2020-02-06 MED ORDER — DEXAMETHASONE SODIUM PHOSPHATE 10 MG/ML IJ SOLN
INTRAMUSCULAR | Status: DC | PRN
  Administered 2020-02-06: 10 mg via INTRAVENOUS

## 2020-02-06 MED ORDER — CEFAZOLIN SODIUM 1 G IJ SOLR
INTRAMUSCULAR | Status: AC
  Filled 2020-02-06: qty 20

## 2020-02-06 MED ORDER — DEXAMETHASONE SODIUM PHOSPHATE 4 MG/ML IJ SOLN: 4.0000 mg | Freq: Three times a day (TID) | INTRAMUSCULAR | Status: DC

## 2020-02-06 MED ORDER — DEXAMETHASONE SODIUM PHOSPHATE 10 MG/ML IJ SOLN
6.0000 mg | Freq: Four times a day (QID) | INTRAMUSCULAR | Status: AC
  Administered 2020-02-06 – 2020-02-07 (×4): 6 mg via INTRAVENOUS
  Filled 2020-02-06 (×4): qty 1

## 2020-02-06 MED ORDER — SUGAMMADEX SODIUM 200 MG/2ML IV SOLN
INTRAVENOUS | Status: DC | PRN
  Administered 2020-02-06: 200 mg via INTRAVENOUS

## 2020-02-06 MED ORDER — FENTANYL CITRATE (PF) 250 MCG/5ML IJ SOLN
INTRAMUSCULAR | Status: AC
  Filled 2020-02-06: qty 5

## 2020-02-06 MED ORDER — ENALAPRILAT 1.25 MG/ML IV SOLN: 1.2500 mg | Freq: Four times a day (QID) | INTRAVENOUS | Status: DC | PRN

## 2020-02-06 MED ORDER — BACITRACIN ZINC 500 UNIT/GM EX OINT
TOPICAL_OINTMENT | CUTANEOUS | Status: AC
  Filled 2020-02-06: qty 28.35

## 2020-02-06 MED ORDER — THROMBIN 5000 UNITS EX SOLR: OROMUCOSAL | Status: DC | PRN

## 2020-02-06 MED ORDER — LABETALOL HCL 5 MG/ML IV SOLN
10.0000 mg | INTRAVENOUS | Status: DC | PRN
  Administered 2020-02-07 (×2): 10 mg via INTRAVENOUS
  Filled 2020-02-06 (×2): qty 4

## 2020-02-06 MED ORDER — POLYETHYLENE GLYCOL 3350 17 G PO PACK: 17.0000 g | PACK | Freq: Every day | ORAL | Status: DC | PRN

## 2020-02-06 MED ORDER — ESMOLOL HCL 100 MG/10ML IV SOLN
INTRAVENOUS | Status: AC
  Filled 2020-02-06: qty 10

## 2020-02-06 MED ORDER — PROPOFOL 10 MG/ML IV BOLUS
INTRAVENOUS | Status: AC
  Filled 2020-02-06: qty 20

## 2020-02-06 MED ORDER — EPHEDRINE 5 MG/ML INJ
INTRAVENOUS | Status: AC
  Filled 2020-02-06: qty 10

## 2020-02-06 MED ORDER — OXYCODONE HCL 5 MG PO TABS: 5.0000 mg | ORAL_TABLET | Freq: Once | ORAL | Status: DC | PRN

## 2020-02-06 MED ORDER — EPHEDRINE SULFATE-NACL 50-0.9 MG/10ML-% IV SOSY
PREFILLED_SYRINGE | INTRAVENOUS | Status: DC | PRN
  Administered 2020-02-06: 10 mg via INTRAVENOUS

## 2020-02-06 MED ORDER — DOCUSATE SODIUM 100 MG PO CAPS
100.0000 mg | ORAL_CAPSULE | Freq: Two times a day (BID) | ORAL | Status: DC
  Administered 2020-02-06 – 2020-02-09 (×6): 100 mg via ORAL
  Filled 2020-02-06 (×4): qty 1

## 2020-02-06 MED ORDER — CHLORHEXIDINE GLUCONATE CLOTH 2 % EX PADS
6.0000 | MEDICATED_PAD | Freq: Every day | CUTANEOUS | Status: DC
  Administered 2020-02-06 – 2020-02-09 (×4): 6 via TOPICAL

## 2020-02-06 MED ORDER — OXYCODONE HCL 5 MG/5ML PO SOLN: 5.0000 mg | Freq: Once | ORAL | Status: DC | PRN

## 2020-02-06 MED ORDER — SODIUM CHLORIDE (PF) 0.9 % IJ SOLN
INTRAMUSCULAR | Status: AC
  Filled 2020-02-06: qty 10

## 2020-02-06 MED ORDER — DEXAMETHASONE SODIUM PHOSPHATE 4 MG/ML IJ SOLN
4.0000 mg | Freq: Four times a day (QID) | INTRAMUSCULAR | Status: DC
  Administered 2020-02-07 (×2): 4 mg via INTRAVENOUS
  Filled 2020-02-06 (×2): qty 1

## 2020-02-06 MED ORDER — MORPHINE SULFATE (PF) 2 MG/ML IV SOLN: 1.0000 mg | INTRAVENOUS | Status: DC | PRN

## 2020-02-06 MED ORDER — FLEET ENEMA 7-19 GM/118ML RE ENEM: 1.0000 | ENEMA | Freq: Once | RECTAL | Status: DC | PRN

## 2020-02-06 MED ORDER — ORAL CARE MOUTH RINSE: 15.0000 mL | Freq: Once | OROMUCOSAL | Status: AC

## 2020-02-06 MED ORDER — MIDAZOLAM HCL 2 MG/2ML IJ SOLN
INTRAMUSCULAR | Status: DC | PRN
  Administered 2020-02-06: 2 mg via INTRAVENOUS

## 2020-02-06 MED ORDER — SODIUM CHLORIDE 0.9 % IV SOLN: INTRAVENOUS | Status: DC | PRN

## 2020-02-06 MED ORDER — LIDOCAINE 2% (20 MG/ML) 5 ML SYRINGE
INTRAMUSCULAR | Status: DC | PRN
  Administered 2020-02-06: 40 mg via INTRAVENOUS

## 2020-02-06 MED ORDER — ENOXAPARIN SODIUM 40 MG/0.4ML ~~LOC~~ SOLN
40.0000 mg | SUBCUTANEOUS | Status: DC
  Administered 2020-02-07 – 2020-02-10 (×4): 40 mg via SUBCUTANEOUS
  Filled 2020-02-06 (×4): qty 0.4

## 2020-02-06 MED ORDER — BACITRACIN ZINC 500 UNIT/GM EX OINT
TOPICAL_OINTMENT | CUTANEOUS | Status: DC | PRN
  Administered 2020-02-06: 1 via TOPICAL

## 2020-02-06 MED ORDER — PANTOPRAZOLE SODIUM 40 MG IV SOLR
40.0000 mg | Freq: Every day | INTRAVENOUS | Status: DC
  Administered 2020-02-06: 40 mg via INTRAVENOUS
  Filled 2020-02-06: qty 40

## 2020-02-06 MED ORDER — LABETALOL HCL 5 MG/ML IV SOLN
INTRAVENOUS | Status: DC | PRN
  Administered 2020-02-06: 2.5 mg via INTRAVENOUS
  Administered 2020-02-06: 5 mg via INTRAVENOUS

## 2020-02-06 MED ORDER — PHENYLEPHRINE 40 MCG/ML (10ML) SYRINGE FOR IV PUSH (FOR BLOOD PRESSURE SUPPORT)
PREFILLED_SYRINGE | INTRAVENOUS | Status: AC
  Filled 2020-02-06: qty 10

## 2020-02-06 MED ORDER — THROMBIN 20000 UNITS EX KIT
PACK | CUTANEOUS | Status: AC
  Filled 2020-02-06: qty 1

## 2020-02-06 MED ORDER — ONDANSETRON HCL 4 MG/2ML IJ SOLN
INTRAMUSCULAR | Status: DC | PRN
  Administered 2020-02-06: 4 mg via INTRAVENOUS

## 2020-02-06 MED ORDER — POTASSIUM CHLORIDE 10 MEQ/100ML IV SOLN
10.0000 meq | INTRAVENOUS | Status: DC
  Administered 2020-02-06 (×3): 10 meq via INTRAVENOUS
  Filled 2020-02-06 (×4): qty 100

## 2020-02-06 MED ORDER — PROMETHAZINE HCL 25 MG PO TABS: 12.5000 mg | ORAL_TABLET | ORAL | Status: DC | PRN

## 2020-02-06 MED ORDER — CEFAZOLIN SODIUM-DEXTROSE 2-3 GM-%(50ML) IV SOLR
INTRAVENOUS | Status: DC | PRN
  Administered 2020-02-06: 2 g via INTRAVENOUS

## 2020-02-06 MED ORDER — THROMBIN 5000 UNITS EX SOLR
CUTANEOUS | Status: AC
  Filled 2020-02-06: qty 10000

## 2020-02-06 MED ORDER — PHENYLEPHRINE HCL (PRESSORS) 10 MG/ML IV SOLN
INTRAVENOUS | Status: AC
  Filled 2020-02-06: qty 1

## 2020-02-06 MED ORDER — MIDAZOLAM HCL 2 MG/2ML IJ SOLN
INTRAMUSCULAR | Status: AC
  Filled 2020-02-06: qty 2

## 2020-02-06 MED ORDER — PHENYLEPHRINE HCL-NACL 10-0.9 MG/250ML-% IV SOLN
INTRAVENOUS | Status: DC | PRN
  Administered 2020-02-06: 25 ug/min via INTRAVENOUS

## 2020-02-06 MED ORDER — THROMBIN 20000 UNITS EX SOLR: CUTANEOUS | Status: DC | PRN

## 2020-02-06 MED ORDER — SODIUM CHLORIDE 0.9 % IV SOLN: INTRAVENOUS | Status: DC

## 2020-02-06 MED ORDER — LIDOCAINE-EPINEPHRINE 1 %-1:100000 IJ SOLN
INTRAMUSCULAR | Status: DC | PRN
  Administered 2020-02-06: 6 mL

## 2020-02-06 MED ORDER — CEFAZOLIN SODIUM-DEXTROSE 1-4 GM/50ML-% IV SOLN
1.0000 g | Freq: Three times a day (TID) | INTRAVENOUS | Status: AC
  Administered 2020-02-06 – 2020-02-07 (×3): 1 g via INTRAVENOUS
  Filled 2020-02-06 (×3): qty 50

## 2020-02-06 MED ORDER — SENNA 8.6 MG PO TABS
1.0000 | ORAL_TABLET | Freq: Two times a day (BID) | ORAL | Status: DC
  Administered 2020-02-06 – 2020-02-09 (×6): 8.6 mg via ORAL
  Filled 2020-02-06 (×4): qty 1

## 2020-02-06 MED ORDER — PROPOFOL 10 MG/ML IV BOLUS
INTRAVENOUS | Status: DC | PRN
  Administered 2020-02-06: 50 mg via INTRAVENOUS
  Administered 2020-02-06: 150 mg via INTRAVENOUS

## 2020-02-06 MED ORDER — NICARDIPINE HCL IN NACL 20-0.86 MG/200ML-% IV SOLN
INTRAVENOUS | Status: AC
  Administered 2020-02-06: 5 mg/h via INTRAVENOUS
  Filled 2020-02-06: qty 200

## 2020-02-06 MED ORDER — POTASSIUM CHLORIDE 10 MEQ/100ML IV SOLN
INTRAVENOUS | Status: DC | PRN
  Administered 2020-02-06: 10 meq via INTRAVENOUS

## 2020-02-06 MED ORDER — CHLORHEXIDINE GLUCONATE 0.12 % MT SOLN
15.0000 mL | Freq: Once | OROMUCOSAL | Status: AC
  Administered 2020-02-06: 15 mL via OROMUCOSAL
  Filled 2020-02-06: qty 15

## 2020-02-06 MED ORDER — FENTANYL CITRATE (PF) 250 MCG/5ML IJ SOLN
INTRAMUSCULAR | Status: DC | PRN
  Administered 2020-02-06 (×2): 100 ug via INTRAVENOUS
  Administered 2020-02-06: 50 ug via INTRAVENOUS

## 2020-02-06 MED ORDER — HYDROCODONE-ACETAMINOPHEN 5-325 MG PO TABS: 1.0000 | ORAL_TABLET | ORAL | Status: DC | PRN

## 2020-02-06 MED ORDER — NICARDIPINE HCL IN NACL 20-0.86 MG/200ML-% IV SOLN
3.0000 mg/h | INTRAVENOUS | Status: DC
  Administered 2020-02-06: 3 mg/h via INTRAVENOUS
  Filled 2020-02-06 (×2): qty 200

## 2020-02-06 MED ORDER — LABETALOL HCL 5 MG/ML IV SOLN
INTRAVENOUS | Status: AC
  Filled 2020-02-06: qty 4

## 2020-02-06 MED ORDER — LIDOCAINE-EPINEPHRINE 1 %-1:100000 IJ SOLN
INTRAMUSCULAR | Status: AC
  Filled 2020-02-06: qty 1

## 2020-02-06 MED ORDER — FENTANYL CITRATE (PF) 100 MCG/2ML IJ SOLN: 25.0000 ug | INTRAMUSCULAR | Status: DC | PRN

## 2020-02-06 MED ORDER — POTASSIUM CHLORIDE IN NACL 20-0.9 MEQ/L-% IV SOLN
INTRAVENOUS | Status: DC
  Filled 2020-02-06 (×2): qty 1000

## 2020-02-06 SURGICAL SUPPLY — 48 items
BLADE CLIPPER SURG (BLADE) ×4 IMPLANT
CANISTER SUCT 3000ML PPV (MISCELLANEOUS) ×12 IMPLANT
CATH VENTRIC 35X38 W/TROCAR LG (CATHETERS) ×4 IMPLANT
DRAPE SHEET LG 3/4 BI-LAMINATE (DRAPES) ×4 IMPLANT
DRAPE WARM FLUID 44X44 (DRAPES) ×4 IMPLANT
DRSG OPSITE 4X5.5 SM (GAUZE/BANDAGES/DRESSINGS) ×4 IMPLANT
DRSG OPSITE POSTOP 3X4 (GAUZE/BANDAGES/DRESSINGS) ×4 IMPLANT
DURAPREP 26ML APPLICATOR (WOUND CARE) ×4 IMPLANT
ELECT REM PT RETURN 9FT ADLT (ELECTROSURGICAL) ×4
ELECTRODE REM PT RTRN 9FT ADLT (ELECTROSURGICAL) ×2 IMPLANT
GLOVE BIO SURGEON STRL SZ7.5 (GLOVE) ×8 IMPLANT
GLOVE BIOGEL PI IND STRL 7.0 (GLOVE) ×8 IMPLANT
GLOVE BIOGEL PI IND STRL 7.5 (GLOVE) ×14 IMPLANT
GLOVE BIOGEL PI INDICATOR 7.0 (GLOVE) ×8
GLOVE BIOGEL PI INDICATOR 7.5 (GLOVE) ×14
GLOVE ECLIPSE 7.5 STRL STRAW (GLOVE) ×8 IMPLANT
GOWN STRL REUS W/ TWL LRG LVL3 (GOWN DISPOSABLE) ×4 IMPLANT
GOWN STRL REUS W/ TWL XL LVL3 (GOWN DISPOSABLE) ×6 IMPLANT
GOWN STRL REUS W/TWL 2XL LVL3 (GOWN DISPOSABLE) IMPLANT
GOWN STRL REUS W/TWL LRG LVL3 (GOWN DISPOSABLE) ×4
GOWN STRL REUS W/TWL XL LVL3 (GOWN DISPOSABLE) ×9
HEMOSTAT POWDER KIT SURGIFOAM (HEMOSTASIS) ×4 IMPLANT
INTRODUCER ENDO MINOP 6.1X6 (INTRODUCER) ×4 IMPLANT
KIT BASIN OR (CUSTOM PROCEDURE TRAY) ×4 IMPLANT
KIT NEEDLE BIOPSY 1.8X235 CRAN (NEEDLE) ×8 IMPLANT
KIT TURNOVER KIT B (KITS) ×4 IMPLANT
KIT VARIOGUIDE DRILL 1.9 (KITS) ×4 IMPLANT
MARKER SPHERE PSV REFLC 13MM (MARKER) ×16 IMPLANT
NEEDLE HYPO 22GX1.5 SAFETY (NEEDLE) ×4 IMPLANT
NEEDLE SPNL 22GX3.5 QUINCKE BK (NEEDLE) ×4 IMPLANT
NS IRRIG 1000ML POUR BTL (IV SOLUTION) ×12 IMPLANT
PACK CRANIOTOMY CUSTOM (CUSTOM PROCEDURE TRAY) ×4 IMPLANT
PERFORATOR LRG  14-11MM (BIT) ×3
PERFORATOR LRG 14-11MM (BIT) ×2 IMPLANT
SPONGE SURGIFOAM ABS GEL 100 (HEMOSTASIS) ×4 IMPLANT
STAPLER VISISTAT 35W (STAPLE) ×8 IMPLANT
SUT MNCRL AB 4-0 PS2 18 (SUTURE) ×4 IMPLANT
SUT MON AB 3-0 SH 27 (SUTURE) ×3
SUT MON AB 3-0 SH27 (SUTURE) ×2 IMPLANT
SUT VICRYL RAPIDE 3/0 (SUTURE) ×4 IMPLANT
SYR 3ML LL SCALE MARK (SYRINGE) ×12 IMPLANT
SYR CONTROL 10ML LL (SYRINGE) ×8 IMPLANT
TOWEL GREEN STERILE (TOWEL DISPOSABLE) ×4 IMPLANT
TOWEL GREEN STERILE FF (TOWEL DISPOSABLE) ×4 IMPLANT
TRAY FOLEY MTR SLVR 16FR STAT (SET/KITS/TRAYS/PACK) ×4 IMPLANT
TUBE CONNECTING 20'X1/4 (TUBING) ×1
TUBE CONNECTING 20X1/4 (TUBING) ×3 IMPLANT
WATER STERILE IRR 1000ML POUR (IV SOLUTION) ×4 IMPLANT

## 2020-02-06 NOTE — Transfer of Care (Signed)
Immediate Anesthesia Transfer of Care Note  Patient: Alicia Mcdonald  Procedure(s) Performed: FRAMELESS BIOPSY WITH BRAINLAB (N/A ) VENTRICULOSTOMY AND ENDOSCOPIC SEPTOSTOMY (N/A Head) APPLICATION OF CRANIAL NAVIGATION (N/A )  Patient Location: PACU  Anesthesia Type:General  Level of Consciousness: awake, alert  and oriented  Airway & Oxygen Therapy: Patient Spontanous Breathing and Patient connected to face mask oxygen  Post-op Assessment: Report given to RN and Post -op Vital signs reviewed and stable  Post vital signs: Reviewed and stable  Last Vitals:  Vitals Value Taken Time  BP 133/70 02/06/20 1600  Temp    Pulse 85 02/06/20 1611  Resp 18 02/06/20 1611  SpO2 100 % 02/06/20 1611  Vitals shown include unvalidated device data.  Last Pain:  Vitals:   02/06/20 0809  TempSrc: Oral  PainSc:          Complications: No apparent anesthesia complications

## 2020-02-06 NOTE — Anesthesia Procedure Notes (Signed)
Procedure Name: Intubation Date/Time: 02/06/2020 12:59 PM Performed by: Alain Marion, CRNA Pre-anesthesia Checklist: Patient identified, Emergency Drugs available, Suction available and Patient being monitored Patient Re-evaluated:Patient Re-evaluated prior to induction Oxygen Delivery Method: Circle System Utilized Preoxygenation: Pre-oxygenation with 100% oxygen Induction Type: IV induction Ventilation: Mask ventilation without difficulty Laryngoscope Size: Miller and 2 Grade View: Grade I Tube type: Oral Number of attempts: 1 Airway Equipment and Method: Stylet and Oral airway Placement Confirmation: ETT inserted through vocal cords under direct vision,  positive ETCO2 and breath sounds checked- equal and bilateral Secured at: 22 cm Tube secured with: Tape Dental Injury: Teeth and Oropharynx as per pre-operative assessment

## 2020-02-06 NOTE — Progress Notes (Signed)
PROGRESS NOTE    Alicia Mcdonald  [id number] DOB: September 18, 1955 DOA: 02/03/2020 PCP: Redmond [employer], MD   Brief Narrative:Alicia R Myersis a 64 y.o.femalewith medical history significant forvitamin D deficiency who was brought to the ED via EMS after her mother found her in the bathroom with significant confusion. She was also noted to have vomited. Apparently patient has been having severe headaches for several days and at the time of EMS arrival her speech was quite incoherent. No other symptoms of fevers, chills, chest pain, shortness of breath, cough, abdominal pain, or diarrhea reported by the mother. No specific weakness or numbness to the extremities or visual field deficits have been noted.  ED Course:Stable vital signs are noted and patient has EKG in sinus rhythm. Serum sodium is 126 and potassium is 3.4 with glucose of 209. CT scanning of the head was immediately performed on arrival under code stroke protocol and she is noted to have an anterior hemispheric brain mass suspicious for glioma versus lymphoma. Patient then admitted to the hospital with altered mental status, brain tumor.  Assessment & Plan:   Active Problems:   Seizure ([place])   Altered mental status   Brain neoplasm ([place])  #1 Brain Tumor : MRI showed Interhemispheric mass centered at the genu of the corpus callosum, septum pellucidum, and fornices, with extension into the left greater than right periventricular white matter. 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 Lymphoma would be the primary differential consideration.  Secondary mass effect on the foramen of Monro with lateral ventriculomegaly. No transependymal flow of CSF, significant midline shift, or herniation. Seen by neurosurgery.  She is going for biopsy septostomy and EVD placement today. Continue Keppra and as needed Ativan  Was not steroid that was on hold.  #2 post ictal status post seizure-patient is more awake alert and  oriented now.  Likely encephalopathy secondary to seizure.  Now on Keppra.     EEG  #3 hyponatremia resolved sodium improved.  #4 DM with hemoglobin A1c of 6.8 no prior diagnosis of diabetes.  Continue SSI.  #5.  Hypokalemia: Replace with IV.  Will replace further with p.o. potassium after surgery.  Magnesium was normal.  Estimated body mass index is 23.57 kg/m as calculated from the following:   Height as of this encounter: 5\' 8"  (1.727 m).   Weight as of this encounter: 70.3 kg.  DVT prophylaxis: SCD Code Status: Full code Family Communication:  No family at bedside. disposition Plan:  Status is: Inpatient  Dispo: The patient is from: home  Anticipated d/c is to: unknown  Anticipated d/c date is: unknown  Patient currently is not medically stable to d/c.  Patient admitted with new onset seizures and brain mass plan for biopsy and ventriculostomy drain today.  Consultants: neurosurgery   Procedures:  Surgery today. Antimicrobials:none  Subjective: Patient seen and examined in the morning rounds.  She is going for surgery today.  Patient was very well aware about the procedure and was asking about good and bad prognosis.  Denied any overnight events.  Objective: Vitals:   02/05/20 2324 02/06/20 0515 02/06/20 0809 02/06/20 1136  BP: 130/69 (!) 114/59 119/66   Pulse: 63 64 64   Resp: 16 16 16    Temp: 98.1 F (36.7 C) 98.1 F (36.7 C) 98.5 F (36.9 C)   TempSrc: Oral Oral Oral   SpO2: 96% 96% 97%   Weight:    70.3 kg  Height:    5\' 8"  (1.727 m)  Intake/Output Summary (Last 24 hours) at 02/06/2020 1239 Last data filed at 02/06/2020 0226 Gross per 24 hour  Intake 1000 ml  Output 500 ml  Net 500 ml   Filed Weights   02/03/20 1008 02/06/20 1136  Weight: 70.3 kg 70.3 kg    Examination:  General exam: Appears calm and comfortable  Respiratory system: Clear to auscultation. Respiratory effort normal. Cardiovascular  system: S1 & S2 heard, RRR. No JVD, murmurs, rubs, gallops or clicks. No pedal edema. Gastrointestinal system: Abdomen is nondistended, soft and nontender. No organomegaly or masses felt. Normal bowel sounds heard. Central nervous system: Alert and oriented. No focal neurological deficits. Extremities: Symmetric 5 x 5 power. Skin: No rashes, lesions or ulcers Psychiatry: Judgement and insight appear normal. Mood & affect appropriate.     Data Reviewed: I have personally reviewed following labs and imaging studies  CBC: Recent Labs  Lab 02/03/20 1030 02/04/20 1046  WBC 9.1 7.8  NEUTROABS 8.3*  --   HGB 13.4 12.7  HCT 40.0 38.1  MCV 88.7 89.4  PLT 272 [id number]   Basic Metabolic Panel: Recent Labs  Lab 02/03/20 1030 02/04/20 1046 02/06/20 0422 02/06/20 0824  NA 126* 137 138  --   K 3.4* 3.8 3.0*  --   CL 90* 104 102  --   CO2 22 26 23   --   GLUCOSE 209* 156* 85  --   BUN 17 12 10   --   CREATININE 0.51 0.62 0.57  --   CALCIUM 9.1 9.1 8.7*  --   MG  --   --   --  1.9   GFR: Estimated Creatinine Clearance: 71.7 mL/min (by C-G formula based on SCr of 0.57 mg/dL). Liver Function Tests: Recent Labs  Lab 02/03/20 1030 02/04/20 1046  AST 26 21  ALT 26 23  ALKPHOS 70 57  BILITOT 0.9 0.8  PROT 7.2 6.0*  ALBUMIN 4.4 3.3*   No results for input(s): LIPASE, AMYLASE in the last 168 hours. No results for input(s): AMMONIA in the last 168 hours. Coagulation Profile: Recent Labs  Lab 02/03/20 1030  INR 1.0   Cardiac Enzymes: No results for input(s): CKTOTAL, CKMB, CKMBINDEX, TROPONINI in the last 168 hours. BNP (last 3 results) No results for input(s): PROBNP in the last 8760 hours. HbA1C: Recent Labs    02/03/20 1510  HGBA1C 6.8*   CBG: Recent Labs  Lab 02/05/20 1530 02/05/20 1937 02/05/20 2326 02/06/20 0338 02/06/20 0811  GLUCAP 76 83 78 85 80   Lipid Profile: No results for input(s): CHOL, HDL, LDLCALC, TRIG, CHOLHDL, LDLDIRECT in the last 72  hours. Thyroid Function Tests: Recent Labs    02/03/20 1510  TSH 0.646   Anemia Panel: No results for input(s): VITAMINB12, FOLATE, FERRITIN, TIBC, IRON, RETICCTPCT in the last 72 hours. Sepsis Labs: No results for input(s): PROCALCITON, LATICACIDVEN in the last 168 hours.  Recent Results (from the past 240 hour(s))  SARS Coronavirus 2 by RT PCR (hospital order, performed in [facility] hospital lab) Nasopharyngeal Nasopharyngeal Swab     Status: None   Collection Time: 02/03/20 10:29 AM   Specimen: Nasopharyngeal Swab  Result Value Ref Range Status   SARS Coronavirus 2 NEGATIVE NEGATIVE Final    Comment: (NOTE) SARS-CoV-2 target nucleic acids are NOT DETECTED. The SARS-CoV-2 RNA is generally detectable in upper and lower respiratory specimens during the acute phase of infection. The lowest concentration of SARS-CoV-2 viral copies this assay can detect is 250 copies / mL. A negative  result does not preclude SARS-CoV-2 infection and should not be used as the sole basis for treatment or other patient management decisions.  A negative result may occur with improper specimen collection / handling, submission of specimen other than nasopharyngeal swab, presence of viral mutation(s) within the areas targeted by this assay, and inadequate number of viral copies (<250 copies / mL). A negative result must be combined with clinical observations, patient history, and epidemiological information. Fact Sheet for Patients:   StrictlyIdeas.no Fact Sheet for Healthcare Providers: BankingDealers.co.za This test is not yet approved or cleared  by the [place] FDA and has been authorized for detection and/or diagnosis of SARS-CoV-2 by FDA under an Emergency Use Authorization (EUA).  This EUA will remain in effect (meaning this test can be used) for the duration of the COVID-19 declaration under Section 564(b)(1) of the Act, 21 U.S.C. section  360bbb-3(b)(1), unless the authorization is terminated or revoked sooner. Performed at [facility], [address], [place], [place] [address]   Surgical PCR screen     Status: None   Collection Time: 02/05/20  8:06 PM   Specimen: Nasal Mucosa; Nasal Swab  Result Value Ref Range Status   MRSA, PCR NEGATIVE NEGATIVE Final   Staphylococcus aureus NEGATIVE NEGATIVE Final    Comment: (NOTE) The Xpert SA Assay (FDA approved for NASAL specimens in patients 53 years of age and older), is one component of a comprehensive surveillance program. It is not intended to diagnose infection nor to guide or monitor treatment. Performed at [facility], [place] [address] [place], [place] [address]          Radiology Studies: CT HEAD WO CONTRAST  Result Date: 02/05/2020 CLINICAL DATA:  Brain mass EXAM: CT HEAD WITHOUT CONTRAST TECHNIQUE: Contiguous axial images were obtained from the base of the skull through the vertex without intravenous contrast. COMPARISON:  02/03/2020 FINDINGS: Brain: Mixed density but primarily hypoattenuating lesion involving the corpus callosum, septum pellucidum/fornices, and adjacent periventricular white matter is more completely evaluated on recent MR imaging. Stable enlargement of the lateral ventricle secondary to mass effect on the foramen of Monro. Vascular: No new findings. Skull: No new findings. Sinuses/Orbits: No new findings. Other: None. IMPRESSION: Stable interhemispheric mass more completely evaluated on prior MRI with corresponding hyperperfusion. Primary differential consideration remains high-grade glioma. Electronically Signed   By: Macy Mis M.D.   On: 02/05/2020 14:23   CT CHEST W CONTRAST  Result Date: 02/05/2020 CLINICAL DATA:  64 year old female with history of brain cancer. Staging examination. EXAM: CT CHEST, ABDOMEN, AND PELVIS WITH CONTRAST TECHNIQUE: Multidetector CT imaging of the chest, abdomen and pelvis was performed  following the standard protocol during bolus administration of intravenous contrast. CONTRAST:  169mL OMNIPAQUE IOHEXOL 300 MG/ML  SOLN COMPARISON:  No priors. FINDINGS: CT CHEST FINDINGS Cardiovascular: Heart size is normal. Small amount of pericardial fluid in a superior pericardial recess. No other significant pericardial thickening or calcification. Aortic atherosclerosis. No definite calcified atherosclerotic plaque identified in the coronary arteries. Mediastinum/Nodes: No pathologically enlarged mediastinal or hilar lymph nodes. Esophagus is unremarkable in appearance. No axillary lymphadenopathy. Lungs/Pleura: 3 mm subpleural nodule in the periphery of the right upper lobe (axial image 31 of series 5). No other larger more suspicious appearing pulmonary nodules or masses are noted. No acute consolidative airspace disease. Small amount of dependent atelectasis and/or scarring in the right lower lobe posteriorly. Trace left pleural effusion. No right pleural effusion. Musculoskeletal: Multiple old healed right-sided rib fractures. There are no  aggressive appearing lytic or blastic lesions noted in the visualized portions of the skeleton. CT ABDOMEN PELVIS FINDINGS Hepatobiliary: No suspicious cystic or solid hepatic lesions. No intra or extrahepatic biliary ductal dilatation. High attenuation material filling the lumen of the gallbladder, likely related to vicarious excretion of contrast material from yesterday's contrast enhanced CT examinations. No surrounding inflammatory changes. Gallbladder is not distended. Pancreas: No pancreatic mass. No pancreatic ductal dilatation. No pancreatic or peripancreatic fluid collections or inflammatory changes. Spleen: Unremarkable. Adrenals/Urinary Tract: Bilateral kidneys and adrenal glands are normal in appearance. No hydroureteronephrosis. Urinary bladder is filled with iodinated contrast material, and along the posterolateral aspect of the urinary bladder wall on the  right side there are several small filling defects abutting the wall measuring up to 6 mm (sagittal image 56 of series 7). Stomach/Bowel: The appearance of the stomach is normal. No pathologic dilatation of small bowel or colon. Normal appendix. Vascular/Lymphatic: Aortic atherosclerosis, without evidence of aneurysm or dissection in the abdominal or pelvic vasculature. No lymphadenopathy noted in the abdomen or pelvis. Reproductive: Uterus and ovaries are unremarkable in appearance. Other: No significant volume of ascites.  No pneumoperitoneum. Musculoskeletal: There are no aggressive appearing lytic or blastic lesions noted in the visualized portions of the skeleton. IMPRESSION: 1. No definitive evidence to suggest metastatic disease to the chest, abdomen or pelvis. 2. Small filling defects in the posterolateral aspect of the urinary bladder on the right side. This could represent some debris, however, these are intimately associated with the bladder wall such that small urothelial lesions are difficult to entirely exclude. Referral to Urology for further evaluation is recommended in the near future. 3. Small 3 mm subpleural nodule in the periphery of the right upper lobe is nonspecific, but statistically likely a benign subpleural lymph node. No follow-up needed if patient is low-risk. Non-contrast chest CT can be considered in 12 months if patient is high-risk. This recommendation follows the consensus statement: Guidelines for Management of Incidental Pulmonary Nodules Detected on CT Images: From the Fleischner Society 2017; Radiology 2017; 284:228-243. 4. Trace left pleural effusion. 5. Aortic atherosclerosis. 6. Additional incidental findings, as above. Electronically Signed   By: Vinnie Langton M.D.   On: 02/05/2020 08:15   CT ABDOMEN PELVIS W CONTRAST  Result Date: 02/05/2020 CLINICAL DATA:  64 year old female with history of brain cancer. Staging examination. EXAM: CT CHEST, ABDOMEN, AND PELVIS WITH  CONTRAST TECHNIQUE: Multidetector CT imaging of the chest, abdomen and pelvis was performed following the standard protocol during bolus administration of intravenous contrast. CONTRAST:  11mL OMNIPAQUE IOHEXOL 300 MG/ML  SOLN COMPARISON:  No priors. FINDINGS: CT CHEST FINDINGS Cardiovascular: Heart size is normal. Small amount of pericardial fluid in a superior pericardial recess. No other significant pericardial thickening or calcification. Aortic atherosclerosis. No definite calcified atherosclerotic plaque identified in the coronary arteries. Mediastinum/Nodes: No pathologically enlarged mediastinal or hilar lymph nodes. Esophagus is unremarkable in appearance. No axillary lymphadenopathy. Lungs/Pleura: 3 mm subpleural nodule in the periphery of the right upper lobe (axial image 31 of series 5). No other larger more suspicious appearing pulmonary nodules or masses are noted. No acute consolidative airspace disease. Small amount of dependent atelectasis and/or scarring in the right lower lobe posteriorly. Trace left pleural effusion. No right pleural effusion. Musculoskeletal: Multiple old healed right-sided rib fractures. There are no aggressive appearing lytic or blastic lesions noted in the visualized portions of the skeleton. CT ABDOMEN PELVIS FINDINGS Hepatobiliary: No suspicious cystic or solid hepatic lesions. No intra or extrahepatic biliary ductal  dilatation. High attenuation material filling the lumen of the gallbladder, likely related to vicarious excretion of contrast material from yesterday's contrast enhanced CT examinations. No surrounding inflammatory changes. Gallbladder is not distended. Pancreas: No pancreatic mass. No pancreatic ductal dilatation. No pancreatic or peripancreatic fluid collections or inflammatory changes. Spleen: Unremarkable. Adrenals/Urinary Tract: Bilateral kidneys and adrenal glands are normal in appearance. No hydroureteronephrosis. Urinary bladder is filled with  iodinated contrast material, and along the posterolateral aspect of the urinary bladder wall on the right side there are several small filling defects abutting the wall measuring up to 6 mm (sagittal image 56 of series 7). Stomach/Bowel: The appearance of the stomach is normal. No pathologic dilatation of small bowel or colon. Normal appendix. Vascular/Lymphatic: Aortic atherosclerosis, without evidence of aneurysm or dissection in the abdominal or pelvic vasculature. No lymphadenopathy noted in the abdomen or pelvis. Reproductive: Uterus and ovaries are unremarkable in appearance. Other: No significant volume of ascites.  No pneumoperitoneum. Musculoskeletal: There are no aggressive appearing lytic or blastic lesions noted in the visualized portions of the skeleton. IMPRESSION: 1. No definitive evidence to suggest metastatic disease to the chest, abdomen or pelvis. 2. Small filling defects in the posterolateral aspect of the urinary bladder on the right side. This could represent some debris, however, these are intimately associated with the bladder wall such that small urothelial lesions are difficult to entirely exclude. Referral to Urology for further evaluation is recommended in the near future. 3. Small 3 mm subpleural nodule in the periphery of the right upper lobe is nonspecific, but statistically likely a benign subpleural lymph node. No follow-up needed if patient is low-risk. Non-contrast chest CT can be considered in 12 months if patient is high-risk. This recommendation follows the consensus statement: Guidelines for Management of Incidental Pulmonary Nodules Detected on CT Images: From the Fleischner Society 2017; Radiology 2017; 284:228-243. 4. Trace left pleural effusion. 5. Aortic atherosclerosis. 6. Additional incidental findings, as above. Electronically Signed   By: Vinnie Langton M.D.   On: 02/05/2020 08:15   EEG adult  Result Date: 02/04/2020 Lora Havens, MD     02/04/2020  1:21 PM  Patient Name: ALBIRDA PAULY MRN: [id number] Epilepsy Attending: Lora Havens Referring Physician/Provider: Dr. Heath Lark Date: 02/04/2020 Duration: 25.33 mins Patient history: 64 year old female presented with nausea vomiting headache and encephalopathy.  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  EEG evaluate for seizures. Level of alertness: awake, asleep AEDs during EEG study: Keppra Technical aspects: This EEG study was done with scalp electrodes positioned according to the 10-20 International system of electrode placement. Electrical activity was acquired at a sampling rate of 500Hz  and reviewed with a high frequency filter of 70Hz  and a low frequency filter of 1Hz . EEG data were recorded continuously and digitally stored. Description: The posterior dominant rhythm consists of 9-10 Hz activity of moderate voltage (25-35 uV) seen predominantly in posterior head regions, symmetric and reactive to eye opening and eye closing. Sleep was characterized by vertex waves, sleep spindles (12 to 14 Hz), maximal frontocentral region. EEG showed continuous generalized 5-6 Hz theta as well as intermittent 2-3Hz  delta slowing. Hyperventilation and photic stimulation were not performed.   ABNORMALITY -Continuous slow, generalized IMPRESSION: This study is suggestive of mild to moderate diffuse encephalopathy, nonspecific etiology. No seizures or epileptiform discharges were seen throughout the recording. Priyanka Barbra Sarks        Scheduled Meds: . [MAR Hold] insulin aspart  0-9 Units Subcutaneous Q4H   Continuous  Infusions: . sodium chloride 75 mL/hr at 02/06/20 0227  . sodium chloride 10 mL/hr at 02/06/20 1150  . [MAR Hold] levETIRAcetam 500 mg (02/05/20 2328)     LOS: 3 days   Total time spent: 25 minutes   Barb Merino, MD 02/06/2020, 12:39 PM

## 2020-02-06 NOTE — Op Note (Signed)
Procedure(s): FRAMELESS BIOPSY WITH BRAINLAB VENTRICULOSTOMY, ENDOSCOPIC SEPTOSTOMY APPLICATION OF CRANIAL NAVIGATION Procedure Note  Alicia Mcdonald female 64 y.o. 02/06/2020  Procedure(s) and Anesthesia Type:    * FRAMELESS BIOPSY WITH BRAINLAB - General    * VENTRICULOSTOMY, ENDOSCOPIC SEPTOSTOMY - General    * APPLICATION OF CRANIAL NAVIGATION - General  Surgeon(s) and Role:    Marcello Moores, Dorcas Carrow, MD - Primary    * Eustace Moore, MD - Assisting        Surgeon: Vallarie Mare   Assistants: Sherley Bounds, MD.  Please note there were no qualified trainees available to assist with the procedure.  Assistance was needed with performing the endoscopic septostomy to be able to introduce and use instruments through the working channels of the endoscope..  Anesthesia: General endotracheal anesthesia    Findings: Frozen section consistent with lesional tissue.  Endoscopic evaluation of the ventricle show significant disease in bilateral fornices and corpus callosum.  Opening pressure in the ventricle appeared normal, approximately 8 cm of water.  Estimated Blood Loss: Less than 10 mL         Drains: Ventriculostomy Drain                Specimens: Corpus callosum mass, superficial biopsy and deep biopsy.  CSF sent for cytology and flow cytometry        Complications:  * No complications entered in OR log *         Disposition: to PACU in stable condition         Condition: stable    Indications: This is a 64 year old woman who presented to hospital with progressive confusion in the setting of worsening headache and nausea and possible seizure activity.  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  Systemic work-up was was negative so the primary considerations were glioblastoma or primary CNS lymphoma.  After interdisciplinary discussion, stereotactic  biopsy of the lesion was recommended.  Additionally, this patient appeared to have some symptoms from possible hydrocephalus, a septostomy to communicate it to lateral ventricles and external ventricular drain to assess for proper CSF circulation was additionally recommended.  Risks, benefits, alternatives, expected convalescence were discussed with the patient, her mother, and brother.  Risks discussed included, but were not limited to, bleeding, pain, infection, seizure, stroke, scar, neurologic deficit, cognitive disability, nondiagnostic sample, ventricular drain failure, coma, and death.  Informed consent was obtained.   Procedure Detail  FRAMELESS BIOPSY WITH BRAINLAB, VENTRICULOSTOMY, ENDOSCOPIC SEPTOSTOMY, APPLICATION OF CRANIAL NAVIGATION  Patient was brought to the operating room.  General anesthesia was induced and patient was intubated by the anesthesia service.  After appropriate lines and monitors were placed, patient positioned supine with the head turned to the left and head was placed in a [place] head holder and affixed to the bed.  A preoperative thin cut CT scan was merged with preoperative MRI and used to perform surface match registration.  This allowed for neuro navigation with computer assistance throughout the case.  A trajectory was planned to biopsy lesion via the right frontal lobe.  This area was marked out on the skin.  Additionally, a right parietal approach to the ventricle for the ventricular scopic portion procedure was planned out on the scalp.  The scalp was clipped, preprepped with alcohol and cleansing solution and prepped and draped in sterile fashion.  A timeout was performed.  Preoperative antibiotics were administered.  A stab incision was made at the planned entry site for biopsy.  The [employer] guide was locked into place along the appropriate trajectory and high-speed drill was used to perform a twist drill opening of the skull.  The dura was punctured.  A  navigated biopsy needle was then passed into the lesion and biopsy specimens were taking in a superficial location of lesion as well as deeper in the enhancing portion.  Specimen was sent for frozen section and permanent and returned as lesional tissue.  The needle was withdrawn, the incision irrigated and closed with 3-0 Vicryl Rapide followed by bacitracin and a sterile dressing.    Attention was then turned to the endoscopic portion of procedure.  The skin was infiltrated with lidocaine with epinephrine and an incision was made overlying the right parietal area.  Self-retaining retractor was placed after clearing the periosteum off the skull.  Perforator was used to perform a bur hole.  The dura was coagulated and opened in cruciate manner.  The pia was coagulated and cut.  Using the [place] navigation, external ventricular drain was used to puncture the ventricle and 20 mL of clear CSF was collected a specimen to be sent for cell count and differential, cytology and flow cytometry.  The ventriculostomy was removed and the peel-away sheath was then passed into the ventricle and secured to the skin.  0 degree endoscope with working channel was then passed into the ventricle.  The lateral ventricle was inspected and there was clearly a tumor infiltrating quite posteriorly in the corpus callosum as well as involving the fornices which were quite expanded.  Below the fornix, there was a diaphanous who area consistent with the septum pellucidum which was thinned.  Bipolar was used to perform the septostomy that was fairly generous.  The endoscope was passed to the contralateral ventricle through the septostomy and a good opening was confirmed.  There is no significant bleeding noted.  The endoscope and peel-away sheath were withdrawn and external ventricular drain was placed in the ventricle.  Gelfoam was placed over the dural opening and the external ventricular drain was tunneled out the skin and secured with a  stitch.  The wound was irrigated thoroughly.  The skin was closed with 2-0 Vicryl stitches in buried fashion followed by staples.  Sterile dressing was then placed in the the external ventricular drain was connected to a drainage bag with purulent.  Patient was then removed from [place] head holder and extubated by the anesthesia service.  All counts were correct at the end of surgery.  No complications were noted.

## 2020-02-06 NOTE — Anesthesia Postprocedure Evaluation (Signed)
Anesthesia Post Note  Patient: Alicia Mcdonald  Procedure(s) Performed: FRAMELESS BIOPSY WITH BRAINLAB (N/A ) VENTRICULOSTOMY AND ENDOSCOPIC SEPTOSTOMY (N/A Head) APPLICATION OF CRANIAL NAVIGATION (N/A )     Patient location during evaluation: PACU Anesthesia Type: General Level of consciousness: patient cooperative, oriented and awake Pain management: pain level controlled Vital Signs Assessment: post-procedure vital signs reviewed and stable Respiratory status: spontaneous breathing, nonlabored ventilation, respiratory function stable and patient connected to nasal cannula oxygen Cardiovascular status: blood pressure returned to baseline and stable Postop Assessment: no apparent nausea or vomiting Anesthetic complications: no    Last Vitals:  Vitals:   02/06/20 1650 02/06/20 1655  BP: 116/67 116/68  Pulse: 83 83  Resp: (!) 24 17  Temp:    SpO2: 99% 100%    Last Pain:  Vitals:   02/06/20 1645  TempSrc:   PainSc: 0-No pain                 [place] COKER

## 2020-02-06 NOTE — Progress Notes (Signed)
Neurosurgery progress note  Patient seen and examined. Awake alert, oriented to person, month, hospital. Mild cognitive deficits noted.  64 yo with corpus callosum lesion -plan on stereotactic biopsy today with possible septostomy and external ventricular drain placement

## 2020-02-06 NOTE — Progress Notes (Signed)
Pt potassium 3.0, Dr. Sharlet Salina informed.

## 2020-02-06 NOTE — Anesthesia Procedure Notes (Signed)
Arterial Line Insertion Start/End5/26/2021 11:30 AM, 02/06/2020 11:40 AM Performed by: Amadeo Garnet, CRNA, CRNA  Patient location: Pre-op. Preanesthetic checklist: patient identified, IV checked, site marked, risks and benefits discussed, surgical consent, monitors and equipment checked, pre-op evaluation, timeout performed and anesthesia consent Lidocaine 1% used for infiltration Right, radial was placed Catheter size: 20 G Hand hygiene performed  and maximum sterile barriers used   Attempts: 1 Procedure performed without using ultrasound guided technique. Following insertion, Biopatch. Post procedure assessment: normal  Patient tolerated the procedure well with no immediate complications.

## 2020-02-06 NOTE — Progress Notes (Signed)
K. Ghimire updated about pt's potassium of 3.0

## 2020-02-06 NOTE — Progress Notes (Signed)
Neurosurgery  Patient seen and examined postoperatively. Falls command stands for without drift. Incisions are dressed and clean and dry. Oriented to person, month, hospital. We plan to admit to the ICU.

## 2020-02-06 NOTE — Anesthesia Procedure Notes (Signed)
Arterial Line Insertion Start/End5/26/2021 1:45 PM Performed by: Roberts Gaudy, MD  Preanesthetic checklist: patient identified, IV checked, site marked, risks and benefits discussed, surgical consent, monitors and equipment checked, pre-op evaluation, timeout performed and anesthesia consent Left, radial was placed Catheter size: 20 G Hand hygiene performed  and maximum sterile barriers used   Attempts: 1 Procedure performed without using ultrasound guided technique. Following insertion, dressing applied and Biopatch. Post procedure assessment: normal and unchanged  Patient tolerated the procedure well with no immediate complications.

## 2020-02-06 NOTE — Plan of Care (Signed)
  Problem: Education: Goal: Knowledge of General Education information will improve Description: Including pain rating scale, medication(s)/side effects and non-pharmacologic comfort measures Outcome: Progressing   Problem: Health Behavior/Discharge Planning: Goal: Ability to manage health-related needs will improve Outcome: Progressing   Problem: Clinical Measurements: Goal: Ability to maintain clinical measurements within normal limits will improve Outcome: Progressing Goal: Will remain free from infection Outcome: Progressing Goal: Diagnostic test results will improve Outcome: Progressing Goal: Respiratory complications will improve Outcome: Progressing Goal: Cardiovascular complication will be avoided Outcome: Progressing   Problem: Activity: Goal: Risk for activity intolerance will decrease Outcome: Progressing   Problem: Nutrition: Goal: Adequate nutrition will be maintained Outcome: Progressing   Problem: Coping: Goal: Level of anxiety will decrease Outcome: Progressing   Problem: Elimination: Goal: Will not experience complications related to bowel motility Outcome: Progressing Goal: Will not experience complications related to urinary retention Outcome: Progressing   Problem: Pain Managment: Goal: General experience of comfort will improve Outcome: Progressing   Problem: Safety: Goal: Ability to remain free from injury will improve Outcome: Progressing   Problem: Skin Integrity: Goal: Risk for impaired skin integrity will decrease Outcome: Progressing   Problem: Education: Goal: Knowledge of disease or condition will improve Outcome: Progressing Goal: Knowledge of secondary prevention will improve Outcome: Progressing Goal: Knowledge of patient specific risk factors addressed and post discharge goals established will improve Outcome: Progressing Goal: Individualized Educational Video(s) Outcome: Progressing   Problem: Coping: Goal: Will verbalize  positive feelings about self Outcome: Progressing Goal: Will identify appropriate support needs Outcome: Progressing   Problem: Health Behavior/Discharge Planning: Goal: Ability to manage health-related needs will improve Outcome: Progressing   Problem: Self-Care: Goal: Ability to participate in self-care as condition permits will improve Outcome: Progressing Goal: Verbalization of feelings and concerns over difficulty with self-care will improve Outcome: Progressing Goal: Ability to communicate needs accurately will improve Outcome: Progressing   Problem: Nutrition: Goal: Risk of aspiration will decrease Outcome: Progressing Goal: Dietary intake will improve Outcome: Progressing   Problem: Ischemic Stroke/TIA Tissue Perfusion: Goal: Complications of ischemic stroke/TIA will be minimized Outcome: Progressing   Problem: Education: Goal: Expressions of having a comfortable level of knowledge regarding the disease process will increase Outcome: Progressing   Problem: Coping: Goal: Ability to adjust to condition or change in health will improve Outcome: Progressing Goal: Ability to identify appropriate support needs will improve Outcome: Progressing   Problem: Health Behavior/Discharge Planning: Goal: Compliance with prescribed medication regimen will improve Outcome: Progressing   Problem: Medication: Goal: Risk for medication side effects will decrease Outcome: Progressing   Problem: Clinical Measurements: Goal: Complications related to the disease process, condition or treatment will be avoided or minimized Outcome: Progressing Goal: Diagnostic test results will improve Outcome: Progressing   Problem: Safety: Goal: Verbalization of understanding the information provided will improve Outcome: Progressing   Problem: Self-Concept: Goal: Level of anxiety will decrease Outcome: Progressing Goal: Ability to verbalize feelings about condition will improve Outcome:  Progressing

## 2020-02-06 NOTE — Anesthesia Preprocedure Evaluation (Signed)
Anesthesia Evaluation  Patient identified by MRN, date of birth, ID band Patient awake    Reviewed: Allergy & Precautions, NPO status , Patient's Chart, lab work & pertinent test results  Airway Mallampati: II  TM Distance: >3 FB Neck ROM: Full    Dental  (+) Teeth Intact, Dental Advisory Given   Pulmonary    breath sounds clear to auscultation       Cardiovascular  Rhythm:Regular Rate:Normal     Neuro/Psych    GI/Hepatic   Endo/Other    Renal/GU      Musculoskeletal   Abdominal   Peds  Hematology   Anesthesia Other Findings   Reproductive/Obstetrics                             Anesthesia Physical Anesthesia Plan  ASA: III  Anesthesia Plan: General   Post-op Pain Management:    Induction: Intravenous  PONV Risk Score and Plan: Ondansetron and Dexamethasone  Airway Management Planned: Oral ETT  Additional Equipment: Arterial line  Intra-op Plan:   Post-operative Plan: Possible Post-op intubation/ventilation  Informed Consent: I have reviewed the patients History and Physical, chart, labs and discussed the procedure including the risks, benefits and alternatives for the proposed anesthesia with the patient or authorized representative who has indicated his/her understanding and acceptance.     Dental advisory given  Plan Discussed with: CRNA and Anesthesiologist  Anesthesia Plan Comments:         Anesthesia Quick Evaluation

## 2020-02-07 ENCOUNTER — Inpatient Hospital Stay (HOSPITAL_COMMUNITY): Payer: BC Managed Care – PPO

## 2020-02-07 LAB — CBC
HCT: 37.4 % (ref 36.0–46.0)
Hemoglobin: 12.5 g/dL (ref 12.0–15.0)
MCH: 29.8 pg (ref 26.0–34.0)
MCHC: 33.4 g/dL (ref 30.0–36.0)
MCV: 89 fL (ref 80.0–100.0)
Platelets: 242 10*3/uL (ref 150–400)
RBC: 4.2 MIL/uL (ref 3.87–5.11)
RDW: 12.8 % (ref 11.5–15.5)
WBC: 12.1 10*3/uL — ABNORMAL HIGH (ref 4.0–10.5)
nRBC: 0 % (ref 0.0–0.2)

## 2020-02-07 LAB — BASIC METABOLIC PANEL
Anion gap: 15 (ref 5–15)
BUN: 10 mg/dL (ref 8–23)
CO2: 21 mmol/L — ABNORMAL LOW (ref 22–32)
Calcium: 8.6 mg/dL — ABNORMAL LOW (ref 8.9–10.3)
Chloride: 103 mmol/L (ref 98–111)
Creatinine, Ser: 0.61 mg/dL (ref 0.44–1.00)
GFR calc Af Amer: >60 mL/min (ref >60)
GFR calc non Af Amer: >60 mL/min (ref >60)
Glucose, Bld: 184 mg/dL — ABNORMAL HIGH (ref 70–99)
Potassium: 3.7 mmol/L (ref 3.5–5.1)
Sodium: 139 mmol/L (ref 135–145)

## 2020-02-07 LAB — GLUCOSE, CAPILLARY
Glucose-Capillary: 193 mg/dL — ABNORMAL HIGH (ref 70–99)
Glucose-Capillary: 277 mg/dL — ABNORMAL HIGH (ref 70–99)
Glucose-Capillary: 254 mg/dL — ABNORMAL HIGH (ref 70–99)
Glucose-Capillary: 235 mg/dL — ABNORMAL HIGH (ref 70–99)
Glucose-Capillary: 245 mg/dL — ABNORMAL HIGH (ref 70–99)
Glucose-Capillary: 212 mg/dL — ABNORMAL HIGH (ref 70–99)

## 2020-02-07 LAB — CYTOLOGY - NON PAP

## 2020-02-07 MED ORDER — INSULIN DETEMIR 100 UNIT/ML ~~LOC~~ SOLN
10.0000 [IU] | Freq: Every day | SUBCUTANEOUS | Status: DC
  Administered 2020-02-07 – 2020-02-09 (×3): 10 [IU] via SUBCUTANEOUS
  Filled 2020-02-07 (×4): qty 0.1

## 2020-02-07 MED ORDER — LEVETIRACETAM 500 MG PO TABS
500.0000 mg | ORAL_TABLET | Freq: Two times a day (BID) | ORAL | Status: DC
  Administered 2020-02-07 – 2020-02-09 (×5): 500 mg via ORAL
  Filled 2020-02-07 (×5): qty 1

## 2020-02-07 MED ORDER — LIVING WELL WITH DIABETES BOOK
Freq: Once | Status: AC
  Filled 2020-02-07: qty 1

## 2020-02-07 MED ORDER — PANTOPRAZOLE SODIUM 40 MG PO TBEC
40.0000 mg | DELAYED_RELEASE_TABLET | Freq: Every day | ORAL | Status: DC
  Administered 2020-02-07 – 2020-02-09 (×3): 40 mg via ORAL
  Filled 2020-02-07 (×3): qty 1

## 2020-02-07 MED FILL — Thrombin For Soln Kit 20000 Unit: CUTANEOUS | Qty: 1 | Status: AC

## 2020-02-07 NOTE — Progress Notes (Signed)
Subjective: Patient reports no headaches  Objective: Vital signs in last 24 hours: Temp:  [97 F (36.1 C)-99.9 F (37.7 C)] 99.9 F (37.7 C) (05/27 0800) Pulse Rate:  [57-92] 73 (05/27 0900) Resp:  [0-24] 16 (05/27 0900) BP: (105-143)/(40-112) 137/66 (05/27 0900) SpO2:  [88 %-100 %] 100 % (05/27 0900) Arterial Line BP: (79-173)/(45-85) 93/64 (05/27 0800) Weight:  [70.3 kg] 70.3 kg (05/26 1136)  Intake/Output from previous day: 05/26 0701 - 05/27 0700 In: 3248.1 [I.V.:2848; IV Piggyback:400] Out: 2632 [Urine:2500; Drains:122; Blood:10] Intake/Output this shift: Total I/O In: 5.5 [I.V.:0.4; IV Piggyback:5.2] Out: 18 [Drains:18]  Alert, oriented to person, hospital. Confused. FC x 4  EVD drainage 134 ml over 16 hr  Lab Results: Recent Labs    02/06/20 1730 02/07/20 0500  WBC 10.7* 12.1*  HGB 13.6 12.5  HCT 41.5 37.4  PLT 232 242   BMET Recent Labs    02/06/20 0422 02/06/20 1315 02/06/20 1527 02/07/20 0500  NA 138   < > 141 139  K 3.0*   < > 3.1* 3.7  CL 102  --   --  103  CO2 23  --   --  21*  GLUCOSE 85  --   --  184*  BUN 10  --   --  10  CREATININE 0.57  --   --  0.61  CALCIUM 8.7*  --   --  8.6*   < > = values in this interval not displayed.    Studies/Results: CT HEAD WO CONTRAST  Result Date: 02/07/2020 CLINICAL DATA:  Postop brain biopsy EXAM: CT HEAD WITHOUT CONTRAST TECHNIQUE: Contiguous axial images were obtained from the base of the skull through the vertex without intravenous contrast. COMPARISON:  CT head 02/05/2020.  MRI head 02/04/2020 FINDINGS: Brain: Hypodense mass lesion in the anterior genu of the corpus callosum has been biopsied. Small amount of hemorrhage is seen near the biopsy site. There is a needle tract through the right frontal bone. There is a small amount of blood in the occipital horns bilaterally. There has been interval placement of a right parietal shunt catheter crossing the midline into the left lateral ventricle.  Ventricles remain mildly dilated and unchanged. The lateral and fourth ventricles are mildly dilated however the third ventricle is not significantly dilated. Negative for acute infarct. No midline shift. Mild amount of subdural gas is present in the right frontal region. Small amount of gas in the right frontal horn. Vascular: Negative for hyperdense vessel Skull: Right parietal burr hole for shunt placement. Right frontal bone needle tract for biopsy. Sinuses/Orbits: Mild mucosal edema paranasal sinuses. Negative orbit. Other: None IMPRESSION: 1. Interval biopsy of hypodense mass in the anterior genu corpus callosum. Small amount of hemorrhage at the biopsy site. Small amount of hemorrhage in the lateral ventricles bilaterally. 2. Mild dilatation of the lateral and fourth ventricles unchanged from pre biopsy study. Third ventricle nondilated. Interval placement of right parietal shunt catheter crossing the midline into the left lateral ventricle. Electronically Signed   By: Franchot Gallo M.D.   On: 02/07/2020 08:28   CT HEAD WO CONTRAST  Result Date: 02/05/2020 CLINICAL DATA:  Brain mass EXAM: CT HEAD WITHOUT CONTRAST TECHNIQUE: Contiguous axial images were obtained from the base of the skull through the vertex without intravenous contrast. COMPARISON:  02/03/2020 FINDINGS: Brain: Mixed density but primarily hypoattenuating lesion involving the corpus callosum, septum pellucidum/fornices, and adjacent periventricular white matter is more completely evaluated on recent MR imaging. Stable enlargement of the  lateral ventricle secondary to mass effect on the foramen of [place]ro. Vascular: No new findings. Skull: No new findings. Sinuses/Orbits: No new findings. Other: None. IMPRESSION: Stable interhemispheric mass more completely evaluated on prior MRI with corresponding hyperperfusion. Primary differential consideration remains high-grade glioma. Electronically Signed   By: Macy Mis M.D.   On: 02/05/2020  14:23    Assessment/Plan: S/p stereotactic biopsy of corpus callosum/forniceal mass and septostomy with EVD - raise EVD to 20 cm; will attempt EVD wean over next several days - dc foley and A-line - can mobilize to chair - [place] 02/07/2020, 10:01 AM

## 2020-02-07 NOTE — Progress Notes (Signed)
PROGRESS NOTE    Alicia Mcdonald  [id number] DOB: Aug 15, 1956 DOA: 02/03/2020 PCP: [employer], MD   Brief Narrative:Alicia R Myersis a 64 y.o.femalewith medical history significant forvitamin D deficiency who was brought to the ED via EMS after her mother found her in the bathroom with significant confusion. She was also noted to have vomited. Apparently patient has been having severe headaches for several days and at the time of EMS arrival her speech was quite incoherent. No other symptoms of fevers, chills, chest pain, shortness of breath, cough, abdominal pain, or diarrhea reported by the mother. No specific weakness or numbness to the extremities or visual field deficits have been noted.  ED Course:Stable vital signs are noted and patient has EKG in sinus rhythm. Serum sodium is 126 and potassium is 3.4 with glucose of 209. CT scanning of the head was immediately performed on arrival under code stroke protocol and she is noted to have an anterior hemispheric brain mass suspicious for glioma versus lymphoma. Patient then admitted to the hospital with altered mental status, brain tumor.  Assessment & Plan:   Active Problems:   Seizure ([place])   Altered mental status   Brain neoplasm ([place])  #1 Brain Tumor : MRI showed Interhemispheric mass centered at the genu of the corpus callosum, septum pellucidum, and fornices, with extension into the left greater than right periventricular white matter. 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 Lymphoma would be the primary differential consideration.  Secondary mass effect on the foramen of Monro with lateral ventriculomegaly. No transependymal flow of CSF, significant midline shift, or herniation. 5/26, patient underwent septostomy, EVD placement. Remains on Keppra for seizure prophylaxis. On steroids.  Patient needing close monitoring up intracranial pressure and invasive lines. Remains in ICU.  Biopsies pending. Postop  management as per neurosurgery.  #2 post ictal status post seizure-patient is more awake alert and oriented now.  Likely encephalopathy secondary to seizure.  Now on Keppra.     #3 hyponatremia resolved sodium improved.  #4 DM with hemoglobin A1c of 6.8 no prior diagnosis of diabetes.  Continue SSI. Blood sugars more than 200. Will add long-acting insulin as she is on steroids now.  #5.  Hypokalemia: Replace with IV and p.o. Magnesium was normal.  Estimated body mass index is 23.57 kg/m as calculated from the following:   Height as of this encounter: 5\' 8"  (1.727 m).   Weight as of this encounter: 70.3 kg.  DVT prophylaxis: SCD Code Status: Full code Family Communication:  No family at bedside. disposition Plan:  Status is: Inpatient  Dispo: The patient is from: home  Anticipated d/c is to: unknown  Anticipated d/c date is: unknown  Patient currently is not medically stable to d/c. Patient needing care in postop neuro ICU with invasive lines and procedures.  Consultants: neurosurgery   Procedures:  Surgery. 5/26. Antimicrobials:none  Subjective: Patient seen and examined. No overnight events. Denies any headache nausea or vomiting. She was wondering about removing catheter and neckline in the morning.  Objective: Vitals:   02/07/20 1100 02/07/20 1200 02/07/20 1300 02/07/20 1400  BP: 129/72 127/67 (!) 112/101 121/66  Pulse: 63 60 70 85  Resp: 13 16 14 19   Temp:  97.7 F (36.5 C)    TempSrc:  Oral    SpO2: 99% 97% 97% 93%  Weight:      Height:        Intake/Output Summary (Last 24 hours) at 02/07/2020 1434 Last data filed at 02/07/2020 1400 Gross  per 24 hour  Intake 3237.81 ml  Output 2516 ml  Net 721.81 ml   Filed Weights   02/03/20 1008 02/06/20 1136  Weight: 70.3 kg 70.3 kg    Examination:  General exam: Appears calm and comfortable, on room air. Respiratory system: Clear to auscultation. Respiratory effort  normal. Cardiovascular system: S1 & S2 heard, RRR. No JVD, murmurs, rubs, gallops or clicks. No pedal edema. Gastrointestinal system: Abdomen is nondistended, soft and nontender. No organomegaly or masses felt. Normal bowel sounds heard. Central nervous system: Alert and oriented. No focal neurological deficits. Patient has postop dressing intact. Intracranial pressure monitoring device present. Extremities: Symmetric 5 x 5 power. Skin: No rashes, lesions or ulcers Psychiatry: Judgement and insight appear normal. Mood & affect appropriate.     Data Reviewed: I have personally reviewed following labs and imaging studies  CBC: Recent Labs  Lab 02/03/20 1030 02/03/20 1030 02/04/20 1046 02/06/20 1315 02/06/20 1527 02/06/20 1730 02/07/20 0500  WBC 9.1  --  7.8  --   --  10.7* 12.1*  NEUTROABS 8.3*  --   --   --   --  9.6*  --   HGB 13.4   < > 12.7 12.2 12.2 13.6 12.5  HCT 40.0   < > 38.1 36.0 36.0 41.5 37.4  MCV 88.7  --  89.4  --   --  90.6 89.0  PLT 272  --  220  --   --  232 242   < > = values in this interval not displayed.   Basic Metabolic Panel: Recent Labs  Lab 02/03/20 1030 02/03/20 1030 02/04/20 1046 02/06/20 0422 02/06/20 0824 02/06/20 1315 02/06/20 1527 02/07/20 0500  NA 126*   < > 137 138  --  141 141 139  K 3.4*   < > 3.8 3.0*  --  3.2* 3.1* 3.7  CL 90*  --  104 102  --   --   --  103  CO2 22  --  26 23  --   --   --  21*  GLUCOSE 209*  --  156* 85  --   --   --  184*  BUN 17  --  12 10  --   --   --  10  CREATININE 0.51  --  0.62 0.57  --   --   --  0.61  CALCIUM 9.1  --  9.1 8.7*  --   --   --  8.6*  MG  --   --   --   --  1.9  --   --   --    < > = values in this interval not displayed.   GFR: Estimated Creatinine Clearance: 71.7 mL/min (by C-G formula based on SCr of 0.61 mg/dL). Liver Function Tests: Recent Labs  Lab 02/03/20 1030 02/04/20 1046  AST 26 21  ALT 26 23  ALKPHOS 70 57  BILITOT 0.9 0.8  PROT 7.2 6.0*  ALBUMIN 4.4 3.3*   No  results for input(s): LIPASE, AMYLASE in the last 168 hours. No results for input(s): AMMONIA in the last 168 hours. Coagulation Profile: Recent Labs  Lab 02/03/20 1030  INR 1.0   Cardiac Enzymes: No results for input(s): CKTOTAL, CKMB, CKMBINDEX, TROPONINI in the last 168 hours. BNP (last 3 results) No results for input(s): PROBNP in the last 8760 hours. HbA1C: No results for input(s): HGBA1C in the last 72 hours. CBG: Recent Labs  Lab 02/06/20 1953 02/06/20 2333  02/07/20 0343 02/07/20 0831 02/07/20 1141  GLUCAP 232* 198* 193* 277* 254*   Lipid Profile: No results for input(s): CHOL, HDL, LDLCALC, TRIG, CHOLHDL, LDLDIRECT in the last 72 hours. Thyroid Function Tests: No results for input(s): TSH, T4TOTAL, FREET4, T3FREE, THYROIDAB in the last 72 hours. Anemia Panel: No results for input(s): VITAMINB12, FOLATE, FERRITIN, TIBC, IRON, RETICCTPCT in the last 72 hours. Sepsis Labs: No results for input(s): PROCALCITON, LATICACIDVEN in the last 168 hours.  Recent Results (from the past 240 hour(s))  SARS Coronavirus 2 by RT PCR (hospital order, performed in [facility] hospital lab) Nasopharyngeal Nasopharyngeal Swab     Status: None   Collection Time: 02/03/20 10:29 AM   Specimen: Nasopharyngeal Swab  Result Value Ref Range Status   SARS Coronavirus 2 NEGATIVE NEGATIVE Final    Comment: (NOTE) SARS-CoV-2 target nucleic acids are NOT DETECTED. The SARS-CoV-2 RNA is generally detectable in upper and lower respiratory specimens during the acute phase of infection. The lowest concentration of SARS-CoV-2 viral copies this assay can detect is 250 copies / mL. A negative result does not preclude SARS-CoV-2 infection and should not be used as the sole basis for treatment or other patient management decisions.  A negative result may occur with improper specimen collection / handling, submission of specimen other than nasopharyngeal swab, presence of viral mutation(s) within the  areas targeted by this assay, and inadequate number of viral copies (<250 copies / mL). A negative result must be combined with clinical observations, patient history, and epidemiological information. Fact Sheet for Patients:   StrictlyIdeas.no Fact Sheet for Healthcare Providers: BankingDealers.co.za This test is not yet approved or cleared  by the [place] FDA and has been authorized for detection and/or diagnosis of SARS-CoV-2 by FDA under an Emergency Use Authorization (EUA).  This EUA will remain in effect (meaning this test can be used) for the duration of the COVID-19 declaration under Section 564(b)(1) of the Act, 21 U.S.C. section 360bbb-3(b)(1), unless the authorization is terminated or revoked sooner. Performed at [facility], [address] [place], [place] [address]   Surgical PCR screen     Status: None   Collection Time: 02/05/20  8:06 PM   Specimen: Nasal Mucosa; Nasal Swab  Result Value Ref Range Status   MRSA, PCR NEGATIVE NEGATIVE Final   Staphylococcus aureus NEGATIVE NEGATIVE Final    Comment: (NOTE) The Xpert SA Assay (FDA approved for NASAL specimens in patients 3 years of age and older), is one component of a comprehensive surveillance program. It is not intended to diagnose infection nor to guide or monitor treatment. Performed at [facility], [place] [address] [place], [place] [address]   CSF culture     Status: None (Preliminary result)   Collection Time: 02/06/20  3:00 PM   Specimen: PATH Cytology CSF; Cerebrospinal Fluid  Result Value Ref Range Status   Specimen Description CSF  Final   Special Requests NONE  Final   Gram Stain NO WBC SEEN NO ORGANISMS SEEN   Final   Culture   Final    NO GROWTH < 24 HOURS Performed at [facility], [place] [address] [place], [place] [address]    Report Status PENDING  Incomplete         Radiology Studies: CT HEAD WO CONTRAST   Result Date: 02/07/2020 CLINICAL DATA:  Postop brain biopsy EXAM: CT HEAD WITHOUT CONTRAST TECHNIQUE: Contiguous axial images were obtained from the base of the skull through the vertex without  intravenous contrast. COMPARISON:  CT head 02/05/2020.  MRI head 02/04/2020 FINDINGS: Brain: Hypodense mass lesion in the anterior genu of the corpus callosum has been biopsied. Small amount of hemorrhage is seen near the biopsy site. There is a needle tract through the right frontal bone. There is a small amount of blood in the occipital horns bilaterally. There has been interval placement of a right parietal shunt catheter crossing the midline into the left lateral ventricle. Ventricles remain mildly dilated and unchanged. The lateral and fourth ventricles are mildly dilated however the third ventricle is not significantly dilated. Negative for acute infarct. No midline shift. Mild amount of subdural gas is present in the right frontal region. Small amount of gas in the right frontal horn. Vascular: Negative for hyperdense vessel Skull: Right parietal burr hole for shunt placement. Right frontal bone needle tract for biopsy. Sinuses/Orbits: Mild mucosal edema paranasal sinuses. Negative orbit. Other: None IMPRESSION: 1. Interval biopsy of hypodense mass in the anterior genu corpus callosum. Small amount of hemorrhage at the biopsy site. Small amount of hemorrhage in the lateral ventricles bilaterally. 2. Mild dilatation of the lateral and fourth ventricles unchanged from pre biopsy study. Third ventricle nondilated. Interval placement of right parietal shunt catheter crossing the midline into the left lateral ventricle. Electronically Signed   By: Franchot Gallo M.D.   On: 02/07/2020 08:28        Scheduled Meds: . Chlorhexidine Gluconate Cloth  6 each Topical Daily  . dexamethasone (DECADRON) injection  4 mg Intravenous Q6H   Followed by  . [START ON 02/08/2020] dexamethasone (DECADRON) injection  4 mg  Intravenous Q8H  . docusate sodium  100 mg Oral BID  . enoxaparin (LOVENOX) injection  40 mg Subcutaneous Q24H  . insulin aspart  0-9 Units Subcutaneous Q4H  . insulin detemir  10 Units Subcutaneous QHS  . living well with diabetes book   Does not apply Once  . pantoprazole (PROTONIX) IV  40 mg Intravenous QHS  . senna  1 tablet Oral BID   Continuous Infusions: . 0.9 % NaCl with KCl 20 mEq / L Stopped (02/07/20 0952)  . levETIRAcetam 500 mg (02/07/20 1235)  . niCARDipine Stopped (02/07/20 0348)     LOS: 4 days   Total time spent: 25 minutes   Barb Merino, MD 02/07/2020, 2:34 PM

## 2020-02-07 NOTE — Progress Notes (Addendum)
Inpatient Diabetes Program Recommendations  AACE/ADA: New Consensus Statement on Inpatient Glycemic Control (2015)  Target Ranges:  Prepandial:   less than 140 mg/dL      Peak postprandial:   less than 180 mg/dL (1-2 hours)      Critically ill patients:  140 - 180 mg/dL   Lab Results  Component Value Date   GLUCAP 277 (H) 02/07/2020   HGBA1C 6.8 (H) 02/03/2020    Review of Glycemic Control Results for Alicia Mcdonald, Alicia Mcdonald (MRN [id number]) as of 02/07/2020 09:47  Ref. Range 02/06/2020 19:53 02/06/2020 23:33 02/07/2020 03:43 02/07/2020 08:31  Glucose-Capillary Latest Ref Range: 70 - 99 mg/dL 232 (H) 198 (H) 193 (H) 277 (H)   Diabetes history: New onset DM Outpatient Diabetes medications: none Current orders for Inpatient glycemic control: Novolog 0-9 units Q4H Decadron 10 mg x 1 Decadron 4 mg Q8H (taper)  Inpatient Diabetes Program Recommendations:    In the setting of steroids, consider: -Switching diet to carb modified -Adding Levemir 8 units QD.   Ordered LWWDM booklet.   Spoke with patient regarding DM diagnosis. Patient reports being prediabetic at previous doctor's visit and that she has been counting carbs and limiting carb intake for the last few years. Family attests and provides examples.  Reviewed patient's current A1c of 6.8%. Explained what a A1c is and what it measures. Also reviewed goal A1c with patient, importance of good glucose control @ home, and blood sugar goals. Reviewed patho of DM, role of pancreas, target goals, signs and symptoms of hypo vs hyper glycemia, impact of steroids, when to repeat A1C, vascular changes and commorbidities.  Patient will need a glucose meter at discharge. Meter kit (includes lancets and strips)(#[id number]). Encouraged to begin checking once a day at varying times and when to call MD.  Patient plans to increase activity as she as able to post op. Reviewed ADA guidelines for activity. Additionally, reviewed options for low carb meals/snakcs,  plate method and other alternatives.  Patient has no further questions. Encouraged to reach out to PCP and make an appointment post hospitalization.    Thanks, Bronson Curb, MSN, RNC-OB Diabetes Coordinator [phone] (8a-5p)

## 2020-02-08 LAB — GLUCOSE, CAPILLARY
Glucose-Capillary: 164 mg/dL — ABNORMAL HIGH (ref 70–99)
Glucose-Capillary: 117 mg/dL — ABNORMAL HIGH (ref 70–99)
Glucose-Capillary: 150 mg/dL — ABNORMAL HIGH (ref 70–99)
Glucose-Capillary: 123 mg/dL — ABNORMAL HIGH (ref 70–99)
Glucose-Capillary: 223 mg/dL — ABNORMAL HIGH (ref 70–99)
Glucose-Capillary: 122 mg/dL — ABNORMAL HIGH (ref 70–99)

## 2020-02-08 MED ORDER — DEXAMETHASONE 4 MG PO TABS
4.0000 mg | ORAL_TABLET | Freq: Four times a day (QID) | ORAL | Status: AC
  Administered 2020-02-08 (×2): 4 mg via ORAL
  Filled 2020-02-08 (×2): qty 1

## 2020-02-08 MED ORDER — DEXAMETHASONE 4 MG PO TABS
4.0000 mg | ORAL_TABLET | Freq: Three times a day (TID) | ORAL | Status: DC
  Administered 2020-02-08 – 2020-02-10 (×5): 4 mg via ORAL
  Filled 2020-02-08 (×5): qty 1

## 2020-02-08 NOTE — Progress Notes (Signed)
Subjective: Patient reports no headaches currently.  Had mild headache when she got up yesterday.  Objective: Vital signs in last 24 hours: Temp:  [97.7 F (36.5 C)-99.2 F (37.3 C)] 98.3 F (36.8 C) (05/28 0800) Pulse Rate:  [54-85] 54 (05/28 0600) Resp:  [13-22] 22 (05/28 0600) BP: (91-144)/(62-101) 138/80 (05/28 0600) SpO2:  [92 %-99 %] 95 % (05/28 0600)  Intake/Output from previous day: 05/27 0701 - 05/28 0700 In: 1289.8 [P.O.:1200; I.V.:34.6; IV Piggyback:55.2] Out: 616 [Urine:450; Drains:166] Intake/Output this shift: Total I/O In: -  Out: 10 [Drains:10]  Awake, alert, oriented x 3.  Cognitive deficits, poor short term memory noted. FC x 4 Dressings c/d.  Biopsy incision c/d  Lab Results: Recent Labs    02/06/20 1730 02/07/20 0500  WBC 10.7* 12.1*  HGB 13.6 12.5  HCT 41.5 37.4  PLT 232 242   BMET Recent Labs    02/06/20 0422 02/06/20 1315 02/06/20 1527 02/07/20 0500  NA 138   < > 141 139  K 3.0*   < > 3.1* 3.7  CL 102  --   --  103  CO2 23  --   --  21*  GLUCOSE 85  --   --  184*  BUN 10  --   --  10  CREATININE 0.57  --   --  0.61  CALCIUM 8.7*  --   --  8.6*   < > = values in this interval not displayed.    Studies/Results: CT HEAD WO CONTRAST  Result Date: 02/07/2020 CLINICAL DATA:  Postop brain biopsy EXAM: CT HEAD WITHOUT CONTRAST TECHNIQUE: Contiguous axial images were obtained from the base of the skull through the vertex without intravenous contrast. COMPARISON:  CT head 02/05/2020.  MRI head 02/04/2020 FINDINGS: Brain: Hypodense mass lesion in the anterior genu of the corpus callosum has been biopsied. Small amount of hemorrhage is seen near the biopsy site. There is a needle tract through the right frontal bone. There is a small amount of blood in the occipital horns bilaterally. There has been interval placement of a right parietal shunt catheter crossing the midline into the left lateral ventricle. Ventricles remain mildly dilated and  unchanged. The lateral and fourth ventricles are mildly dilated however the third ventricle is not significantly dilated. Negative for acute infarct. No midline shift. Mild amount of subdural gas is present in the right frontal region. Small amount of gas in the right frontal horn. Vascular: Negative for hyperdense vessel Skull: Right parietal burr hole for shunt placement. Right frontal bone needle tract for biopsy. Sinuses/Orbits: Mild mucosal edema paranasal sinuses. Negative orbit. Other: None IMPRESSION: 1. Interval biopsy of hypodense mass in the anterior genu corpus callosum. Small amount of hemorrhage at the biopsy site. Small amount of hemorrhage in the lateral ventricles bilaterally. 2. Mild dilatation of the lateral and fourth ventricles unchanged from pre biopsy study. Third ventricle nondilated. Interval placement of right parietal shunt catheter crossing the midline into the left lateral ventricle. Electronically Signed   By: Franchot Gallo M.D.   On: 02/07/2020 08:28    Assessment/Plan: Corpus callosum/bilateral forniceal mass s/p biopsy and EVD with endoscopic septostomy - clinically doing well with drain at 20 cm but there has been a fair amount of drainage (166 ml).  Will begin clamp trial with ICP transduction and obtain CT head tomorrow.  If she fails, she will need VP shunt early next week.   Vallarie Mare 02/08/2020, 9:05 AM

## 2020-02-08 NOTE — Progress Notes (Signed)
PROGRESS NOTE    Alicia Mcdonald  [id number] DOB: February 22, 1956 DOA: 02/03/2020 PCP: Redmond School, MD   Brief Narrative:Alicia Mcdonald a 64 y.o.femalewith medical history significant forvitamin D deficiency who was brought to the ED via EMS after her mother found her in the bathroom with significant confusion. She was also noted to have vomited. Apparently patient has been having severe headaches for several days and at the time of EMS arrival her speech was quite incoherent. No other symptoms of fevers, chills, chest pain, shortness of breath, cough, abdominal pain, or diarrhea reported by the mother. No specific weakness or numbness to the extremities or visual field deficits have been noted.  ED Course:Stable vital signs are noted and patient has EKG in sinus rhythm. Serum sodium is 126 and potassium is 3.4 with glucose of 209. CT scanning of the head was immediately performed on arrival under code stroke protocol and she is noted to have an anterior hemispheric brain mass suspicious for glioma versus lymphoma. Patient then admitted to the hospital with altered mental status, brain tumor.  Assessment & Plan:   Active Problems:   Seizure ([place])   Altered mental status   Brain neoplasm ([place])  #1 Brain Tumor : MRI showed Interhemispheric mass centered at the genu of the corpus callosum, septum pellucidum, and fornices, with extension into the left greater than right periventricular white matter. 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 Lymphoma would be the primary differential consideration.  Secondary mass effect on the foramen of [place]ro with lateral ventriculomegaly. No transependymal flow of CSF, significant midline shift, or herniation. 5/26, patient underwent septostomy, EVD placement. Remains on Keppra for seizure prophylaxis. On steroids.  Patient needing close monitoring up intracranial pressure and invasive lines. Remains in ICU.  Biopsies pending. Postop  management as per neurosurgery.  #2 post ictal status post seizure-patient is more awake alert and oriented now.  Likely encephalopathy secondary to seizure.  Now on Keppra.     #3 hyponatremia resolved sodium improved.  #4 DM with hemoglobin A1c of 6.8 no prior diagnosis of diabetes.  Continue SSI. Blood sugars more than 200.  Added long-acting insulin 10 units at night.  Blood sugars fairly controlled.  Will keep on similar dose of insulin today.  #5.  Hypokalemia: Replaced.  Continue to monitor.  Estimated body mass index is 23.57 kg/m as calculated from the following:   Height as of this encounter: 5\' 8"  (1.727 m).   Weight as of this encounter: 70.3 kg.  DVT prophylaxis: SCD Code Status: Full code Family Communication:  No family at bedside. disposition Plan:  Status is: Inpatient  Dispo: The patient is from: home  Anticipated d/c is to: unknown  Anticipated d/c date is: unknown  Patient currently is not medically stable to d/c. Patient needing care in postop neuro ICU with invasive lines and procedures.  Consultants: neurosurgery   Procedures:  Surgery. 5/26. Antimicrobials:none  Subjective:  seen and examined.  No overnight events.  Has some headache on mobilization, however she did overall think she is doing well. Objective: Vitals:   02/08/20 0600 02/08/20 0800 02/08/20 0900 02/08/20 1004  BP: 138/80  131/72 140/79  Pulse: (!) 54  77   Resp: (!) 22  13 (!) 23  Temp:  98.3 F (36.8 C)    TempSrc:  Oral    SpO2: 95%  99%   Weight:      Height:        Intake/Output Summary (Last 24 hours) at  02/08/2020 1052 Last data filed at 02/08/2020 0900 Gross per 24 hour  Intake 720 ml  Output 152 ml  Net 568 ml   Filed Weights   02/03/20 1008 02/06/20 1136  Weight: 70.3 kg 70.3 kg    Examination:  General exam: Appears calm and comfortable, on room air. Respiratory system: Clear to auscultation. Respiratory effort  normal. Cardiovascular system: S1 & S2 heard, RRR. No JVD, murmurs, rubs, gallops or clicks. No pedal edema. Gastrointestinal system: Abdomen is nondistended, soft and nontender. No organomegaly or masses felt. Normal bowel sounds heard. Central nervous system: Alert and oriented. No focal neurological deficits. Patient has postop dressing intact. Intracranial pressure monitoring device present. Extremities: Symmetric 5 x 5 power. Skin: No rashes, lesions or ulcers Psychiatry: Judgement and insight appear normal. Mood & affect appropriate.     Data Reviewed: I have personally reviewed following labs and imaging studies  CBC: Recent Labs  Lab 02/03/20 1030 02/03/20 1030 02/04/20 1046 02/06/20 1315 02/06/20 1527 02/06/20 1730 02/07/20 0500  WBC 9.1  --  7.8  --   --  10.7* 12.1*  NEUTROABS 8.3*  --   --   --   --  9.6*  --   HGB 13.4   < > 12.7 12.2 12.2 13.6 12.5  HCT 40.0   < > 38.1 36.0 36.0 41.5 37.4  MCV 88.7  --  89.4  --   --  90.6 89.0  PLT 272  --  220  --   --  232 242   < > = values in this interval not displayed.   Basic Metabolic Panel: Recent Labs  Lab 02/03/20 1030 02/03/20 1030 02/04/20 1046 02/06/20 0422 02/06/20 0824 02/06/20 1315 02/06/20 1527 02/07/20 0500  NA 126*   < > 137 138  --  141 141 139  K 3.4*   < > 3.8 3.0*  --  3.2* 3.1* 3.7  CL 90*  --  104 102  --   --   --  103  CO2 22  --  26 23  --   --   --  21*  GLUCOSE 209*  --  156* 85  --   --   --  184*  BUN 17  --  12 10  --   --   --  10  CREATININE 0.51  --  0.62 0.57  --   --   --  0.61  CALCIUM 9.1  --  9.1 8.7*  --   --   --  8.6*  MG  --   --   --   --  1.9  --   --   --    < > = values in this interval not displayed.   GFR: Estimated Creatinine Clearance: 71.7 mL/min (by C-G formula based on SCr of 0.61 mg/dL). Liver Function Tests: Recent Labs  Lab 02/03/20 1030 02/04/20 1046  AST 26 21  ALT 26 23  ALKPHOS 70 57  BILITOT 0.9 0.8  PROT 7.2 6.0*  ALBUMIN 4.4 3.3*   No  results for input(s): LIPASE, AMYLASE in the last 168 hours. No results for input(s): AMMONIA in the last 168 hours. Coagulation Profile: Recent Labs  Lab 02/03/20 1030  INR 1.0   Cardiac Enzymes: No results for input(s): CKTOTAL, CKMB, CKMBINDEX, TROPONINI in the last 168 hours. BNP (last 3 results) No results for input(s): PROBNP in the last 8760 hours. HbA1C: No results for input(s): HGBA1C in the last 72 hours.  CBG: Recent Labs  Lab 02/07/20 1618 02/07/20 1923 02/07/20 2325 02/08/20 0413 02/08/20 0803  GLUCAP 235* 245* 212* 164* 117*   Lipid Profile: No results for input(s): CHOL, HDL, LDLCALC, TRIG, CHOLHDL, LDLDIRECT in the last 72 hours. Thyroid Function Tests: No results for input(s): TSH, T4TOTAL, FREET4, T3FREE, THYROIDAB in the last 72 hours. Anemia Panel: No results for input(s): VITAMINB12, FOLATE, FERRITIN, TIBC, IRON, RETICCTPCT in the last 72 hours. Sepsis Labs: No results for input(s): PROCALCITON, LATICACIDVEN in the last 168 hours.  Recent Results (from the past 240 hour(s))  SARS Coronavirus 2 by RT PCR (hospital order, performed in [facility] hospital lab) Nasopharyngeal Nasopharyngeal Swab     Status: None   Collection Time: 02/03/20 10:29 AM   Specimen: Nasopharyngeal Swab  Result Value Ref Range Status   SARS Coronavirus 2 NEGATIVE NEGATIVE Final    Comment: (NOTE) SARS-CoV-2 target nucleic acids are NOT DETECTED. The SARS-CoV-2 RNA is generally detectable in upper and lower respiratory specimens during the acute phase of infection. The lowest concentration of SARS-CoV-2 viral copies this assay can detect is 250 copies / mL. A negative result does not preclude SARS-CoV-2 infection and should not be used as the sole basis for treatment or other patient management decisions.  A negative result may occur with improper specimen collection / handling, submission of specimen other than nasopharyngeal swab, presence of viral mutation(s) within  the areas targeted by this assay, and inadequate number of viral copies (<250 copies / mL). A negative result must be combined with clinical observations, patient history, and epidemiological information. Fact Sheet for Patients:   StrictlyIdeas.no Fact Sheet for Healthcare Providers: BankingDealers.co.za This test is not yet approved or cleared  by the [place] FDA and has been authorized for detection and/or diagnosis of SARS-CoV-2 by FDA under an Emergency Use Authorization (EUA).  This EUA will remain in effect (meaning this test can be used) for the duration of the COVID-19 declaration under Section 564(b)(1) of the Act, 21 U.S.C. section 360bbb-3(b)(1), unless the authorization is terminated or revoked sooner. Performed at [facility], [address], [place], [place] [address]   Surgical PCR screen     Status: None   Collection Time: 02/05/20  8:06 PM   Specimen: Nasal Mucosa; Nasal Swab  Result Value Ref Range Status   MRSA, PCR NEGATIVE NEGATIVE Final   Staphylococcus aureus NEGATIVE NEGATIVE Final    Comment: (NOTE) The Xpert SA Assay (FDA approved for NASAL specimens in patients 71 years of age and older), is one component of a comprehensive surveillance program. It is not intended to diagnose infection nor to guide or monitor treatment. Performed at [facility], [place] [address], [place], [place] [address]   CSF culture     Status: None (Preliminary result)   Collection Time: 02/06/20  3:00 PM   Specimen: PATH Cytology CSF; Cerebrospinal Fluid  Result Value Ref Range Status   Specimen Description CSF  Final   Special Requests NONE  Final   Gram Stain NO WBC SEEN NO ORGANISMS SEEN   Final   Culture   Final    NO GROWTH < 24 HOURS Performed at [facility], [place] [address], [place], [place] [address]    Report Status PENDING  Incomplete         Radiology Studies: CT HEAD WO  CONTRAST  Result Date: 02/07/2020 CLINICAL DATA:  Postop brain biopsy EXAM: CT HEAD WITHOUT CONTRAST TECHNIQUE: Contiguous axial images were obtained from  the base of the skull through the vertex without intravenous contrast. COMPARISON:  CT head 02/05/2020.  MRI head 02/04/2020 FINDINGS: Brain: Hypodense mass lesion in the anterior genu of the corpus callosum has been biopsied. Small amount of hemorrhage is seen near the biopsy site. There is a needle tract through the right frontal bone. There is a small amount of blood in the occipital horns bilaterally. There has been interval placement of a right parietal shunt catheter crossing the midline into the left lateral ventricle. Ventricles remain mildly dilated and unchanged. The lateral and fourth ventricles are mildly dilated however the third ventricle is not significantly dilated. Negative for acute infarct. No midline shift. Mild amount of subdural gas is present in the right frontal region. Small amount of gas in the right frontal horn. Vascular: Negative for hyperdense vessel Skull: Right parietal burr hole for shunt placement. Right frontal bone needle tract for biopsy. Sinuses/Orbits: Mild mucosal edema paranasal sinuses. Negative orbit. Other: None IMPRESSION: 1. Interval biopsy of hypodense mass in the anterior genu corpus callosum. Small amount of hemorrhage at the biopsy site. Small amount of hemorrhage in the lateral ventricles bilaterally. 2. Mild dilatation of the lateral and fourth ventricles unchanged from pre biopsy study. Third ventricle nondilated. Interval placement of right parietal shunt catheter crossing the midline into the left lateral ventricle. Electronically Signed   By: Franchot Gallo M.D.   On: 02/07/2020 08:28        Scheduled Meds: . Chlorhexidine Gluconate Cloth  6 each Topical Daily  . dexamethasone  4 mg Oral Q6H   Followed by  . dexamethasone  4 mg Oral Q8H  . docusate sodium  100 mg Oral BID  . enoxaparin  (LOVENOX) injection  40 mg Subcutaneous Q24H  . insulin aspart  0-9 Units Subcutaneous Q4H  . insulin detemir  10 Units Subcutaneous QHS  . levETIRAcetam  500 mg Oral BID  . pantoprazole  40 mg Oral QHS  . senna  1 tablet Oral BID   Continuous Infusions: . 0.9 % NaCl with KCl 20 mEq / L Stopped (02/07/20 0952)  . niCARDipine Stopped (02/07/20 0348)     LOS: 5 days   Total time spent: 25 minutes   Barb Merino, MD 02/08/2020, 10:52 AM

## 2020-02-08 NOTE — Progress Notes (Signed)
Inpatient Diabetes Program Recommendations  AACE/ADA: New Consensus Statement on Inpatient Glycemic Control (2015)  Target Ranges:  Prepandial:   less than 140 mg/dL      Peak postprandial:   less than 180 mg/dL (1-2 hours)      Critically ill patients:  140 - 180 mg/dL   Lab Results  Component Value Date   GLUCAP 117 (H) 02/08/2020   HGBA1C 6.8 (H) 02/03/2020    Review of Glycemic Control Results for [place], [place] R (MRN [id number]) as of 02/08/2020 11:34  Ref. Range 02/07/2020 19:23 02/07/2020 23:25 02/08/2020 04:13 02/08/2020 08:03  Glucose-Capillary Latest Ref Range: 70 - 99 mg/dL 245 (H) 212 (H) 164 (H) 117 (H)   Diabetes history: New onset DM Outpatient Diabetes medications: none Current orders for Inpatient glycemic control: Novolog 0-9 units Q4H Decadron 10 mg x 1 Decadron 4 mg Q8H (taper)  Inpatient Diabetes Program Recommendations:    In the setting of steroids, consider: Adding Novolog 3 units TID (assuming patient is consuming >50% of meals; to be stopped once steroids are discontinued).   Patient will need a glucose meter at discharge. Meter kit (includes lancets and strips)(#[id number]).  Thanks, Lauren McDaniel, MSN, RNC-OB Diabetes Coordinator [phone] (8a-5p)    

## 2020-02-09 ENCOUNTER — Inpatient Hospital Stay (HOSPITAL_COMMUNITY): Payer: BC Managed Care – PPO

## 2020-02-09 LAB — GLUCOSE, CAPILLARY
Glucose-Capillary: 107 mg/dL — ABNORMAL HIGH (ref 70–99)
Glucose-Capillary: 124 mg/dL — ABNORMAL HIGH (ref 70–99)
Glucose-Capillary: 128 mg/dL — ABNORMAL HIGH (ref 70–99)
Glucose-Capillary: 139 mg/dL — ABNORMAL HIGH (ref 70–99)
Glucose-Capillary: 212 mg/dL — ABNORMAL HIGH (ref 70–99)
Glucose-Capillary: 98 mg/dL (ref 70–99)

## 2020-02-09 LAB — BASIC METABOLIC PANEL
Anion gap: 12 (ref 5–15)
BUN: 17 mg/dL (ref 8–23)
CO2: 28 mmol/L (ref 22–32)
Calcium: 9 mg/dL (ref 8.9–10.3)
Chloride: 102 mmol/L (ref 98–111)
Creatinine, Ser: 0.7 mg/dL (ref 0.44–1.00)
GFR calc Af Amer: >60 mL/min (ref >60)
GFR calc non Af Amer: >60 mL/min (ref >60)
Glucose, Bld: 100 mg/dL — ABNORMAL HIGH (ref 70–99)
Potassium: 3.2 mmol/L — ABNORMAL LOW (ref 3.5–5.1)
Sodium: 142 mmol/L (ref 135–145)

## 2020-02-09 LAB — CD19 AND CD20, FLOW CYTOMETRY

## 2020-02-09 MED ORDER — POTASSIUM CHLORIDE CRYS ER 20 MEQ PO TBCR
20.0000 meq | EXTENDED_RELEASE_TABLET | Freq: Two times a day (BID) | ORAL | Status: DC
  Administered 2020-02-09 – 2020-02-10 (×3): 20 meq via ORAL
  Filled 2020-02-09 (×3): qty 1

## 2020-02-09 NOTE — Progress Notes (Signed)
  NEUROSURGERY PROGRESS NOTE   Pt seen and examined. No issues overnight. No complaints this am, denying HA, visual changes.   EXAM: Temp:  [98 F (36.7 C)-99.7 F (37.6 C)] 99.7 F (37.6 C) (05/29 0800) Pulse Rate:  [45-119] 60 (05/29 0800) Resp:  [11-23] 18 (05/29 0800) BP: (102-156)/(65-104) 120/70 (05/29 0800) SpO2:  [91 %-100 %] 98 % (05/29 0800) Intake/Output      05/28 0701 - 05/29 0700 05/29 0701 - 05/30 0700   P.O. 240    I.V. (mL/kg)     IV Piggyback     Total Intake(mL/kg) 240 (3.4)    Urine (mL/kg/hr) 0 (0)    Drains 12    Stool 0    Total Output 12    Net +228         Urine Occurrence 10 x    Stool Occurrence 1 x     Awake, alert, oriented Speech fluent CN grossly intact MAE good strength EVD in place, clamped  LABS: Lab Results  Component Value Date   CREATININE 0.70 02/09/2020   BUN 17 02/09/2020   NA 142 02/09/2020   K 3.2 (L) 02/09/2020   CL 102 02/09/2020   CO2 28 02/09/2020   Lab Results  Component Value Date   WBC 12.1 (H) 02/07/2020   HGB 12.5 02/07/2020   HCT 37.4 02/07/2020   MCV 89.0 02/07/2020   PLT 242 02/07/2020    IMAGING: CTH this am reviewed, stable in comparison to prior CT. No increase in ventricular size.  IMPRESSION: - 64 y.o. female s/p biopsy of callosal/forniceal lesion and EVD placement. No clinical or radiographic signs of HCP after EVD clamped for 24hrs.  PLAN: - D/C EVD this am - Will observe in ICU for 24, can likely transfer to floor tomorrow if stable - Cont to mobilize

## 2020-02-09 NOTE — Progress Notes (Signed)
PROGRESS NOTE    Alicia Mcdonald  [id number] DOB: Nov 20, 1955 DOA: 02/03/2020 PCP: Redmond [employer], MD   Brief Narrative:Alicia R Myersis a 64 y.o.femalewith medical history significant forvitamin D deficiency who was brought to the ED via EMS after her mother found her in the bathroom with significant confusion. She was also noted to have vomited. Apparently patient has been having severe headaches for several days and at the time of EMS arrival her speech was quite incoherent. No other symptoms of fevers, chills, chest pain, shortness of breath, cough, abdominal pain, or diarrhea reported by the mother. No specific weakness or numbness to the extremities or visual field deficits have been noted.  ED Course:Stable vital signs are noted and patient has EKG in sinus rhythm. Serum sodium is 126 and potassium is 3.4 with glucose of 209. CT scanning of the head was immediately performed on arrival under code stroke protocol and she is noted to have an anterior hemispheric brain mass suspicious for glioma versus lymphoma. Patient then admitted to the hospital with altered mental status, brain tumor.  Assessment & Plan:   Active Problems:   Seizure ([place])   Altered mental status   Brain neoplasm ([place])  #1 Brain Tumor : MRI showed Interhemispheric mass centered at the genu of the corpus callosum, septum pellucidum, and fornices, with extension into the left greater than right periventricular white matter. 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 Lymphoma would be the primary differential consideration.  Secondary mass effect on the foramen of [place]ro with lateral ventriculomegaly. No transependymal flow of CSF, significant midline shift, or herniation. 5/26, patient underwent septostomy, EVD placement. Remains on Keppra for seizure prophylaxis. On steroids.  Biopsies pending. Postop management as per neurosurgery.  #2 post ictal status post seizure-patient is more awake alert  and oriented now.  Likely encephalopathy secondary to seizure.  Now on Keppra.     #3 hyponatremia resolved sodium improved.  #4 DM with hemoglobin A1c of 6.8 no prior diagnosis of diabetes.  Continue SSI. Blood sugars more than 200.  Added long-acting insulin 10 units at night.  Blood sugars fairly controlled.  Will keep on similar dose of insulin today.  #5.  Hypokalemia: Replaced.  Replace further.  Estimated body mass index is 23.57 kg/m as calculated from the following:   Height as of this encounter: 5\' 8"  (1.727 m).   Weight as of this encounter: 70.3 kg.  DVT prophylaxis: SCD Code Status: Full code Family Communication:  No family at bedside.  Patient talking to her mom. disposition Plan:  Status is: Inpatient  Dispo: The patient is from: home  Anticipated d/c is to: unknown  Anticipated d/c date is: unknown  Patient currently is not medically stable to d/c. Patient needing care in postop neuro ICU with invasive lines and procedures.  Consultants: neurosurgery   Procedures:  Surgery. 5/26. Antimicrobials:none  Subjective: No overnight events.  Up in chair.  Has lot of questions about wound care, shower and dressings. Objective: Vitals:   02/09/20 0900 02/09/20 1000 02/09/20 1100 02/09/20 1200  BP: 118/64 114/60 129/71 114/86  Pulse: (!) 55 (!) 56 (!) 47 (!) 43  Resp: (!) 21 20 (!) 24 10  Temp:    98.8 F (37.1 C)  TempSrc:    Oral  SpO2: 99% 100% 98% 98%  Weight:      Height:        Intake/Output Summary (Last 24 hours) at 02/09/2020 1320 Last data filed at 02/09/2020 0830 Gross per 24  hour  Intake 480 ml  Output --  Net 480 ml   Filed Weights   02/03/20 1008 02/06/20 1136  Weight: 70.3 kg 70.3 kg    Examination:  General exam: Appears calm and comfortable, on room air. Respiratory system: Clear to auscultation. Respiratory effort normal. Cardiovascular system: S1 & S2 heard, RRR. No JVD, murmurs, rubs,  gallops or clicks. No pedal edema. Gastrointestinal system: Abdomen is nondistended, soft and nontender. No organomegaly or masses felt. Normal bowel sounds heard. Central nervous system: Alert and oriented. No focal neurological deficits. Patient has postop dressing intact. Intracranial pressure monitoring device present. Extremities: Symmetric 5 x 5 power. Skin: No rashes, lesions or ulcers Psychiatry: Judgement and insight appear normal. Mood & affect appropriate.     Data Reviewed: I have personally reviewed following labs and imaging studies  CBC: Recent Labs  Lab 02/03/20 1030 02/03/20 1030 02/04/20 1046 02/06/20 1315 02/06/20 1527 02/06/20 1730 02/07/20 0500  WBC 9.1  --  7.8  --   --  10.7* 12.1*  NEUTROABS 8.3*  --   --   --   --  9.6*  --   HGB 13.4   < > 12.7 12.2 12.2 13.6 12.5  HCT 40.0   < > 38.1 36.0 36.0 41.5 37.4  MCV 88.7  --  89.4  --   --  90.6 89.0  PLT 272  --  220  --   --  232 242   < > = values in this interval not displayed.   Basic Metabolic Panel: Recent Labs  Lab 02/03/20 1030 02/03/20 1030 02/04/20 1046 02/04/20 1046 02/06/20 0422 02/06/20 0824 02/06/20 1315 02/06/20 1527 02/07/20 0500 02/09/20 0631  NA 126*   < > 137   < > 138  --  141 141 139 142  K 3.4*   < > 3.8   < > 3.0*  --  3.2* 3.1* 3.7 3.2*  CL 90*  --  104  --  102  --   --   --  103 102  CO2 22  --  26  --  23  --   --   --  21* 28  GLUCOSE 209*  --  156*  --  85  --   --   --  184* 100*  BUN 17  --  12  --  10  --   --   --  10 17  CREATININE 0.51  --  0.62  --  0.57  --   --   --  0.61 0.70  CALCIUM 9.1  --  9.1  --  8.7*  --   --   --  8.6* 9.0  MG  --   --   --   --   --  1.9  --   --   --   --    < > = values in this interval not displayed.   GFR: Estimated Creatinine Clearance: 71.7 mL/min (by C-G formula based on SCr of 0.7 mg/dL). Liver Function Tests: Recent Labs  Lab 02/03/20 1030 02/04/20 1046  AST 26 21  ALT 26 23  ALKPHOS 70 57  BILITOT 0.9 0.8   PROT 7.2 6.0*  ALBUMIN 4.4 3.3*   No results for input(s): LIPASE, AMYLASE in the last 168 hours. No results for input(s): AMMONIA in the last 168 hours. Coagulation Profile: Recent Labs  Lab 02/03/20 1030  INR 1.0   Cardiac Enzymes: No results for input(s): CKTOTAL, CKMB,  CKMBINDEX, TROPONINI in the last 168 hours. BNP (last 3 results) No results for input(s): PROBNP in the last 8760 hours. HbA1C: No results for input(s): HGBA1C in the last 72 hours. CBG: Recent Labs  Lab 02/08/20 1953 02/08/20 2311 02/09/20 0320 02/09/20 0814 02/09/20 1202  GLUCAP 223* 122* 124* 128* 98   Lipid Profile: No results for input(s): CHOL, HDL, LDLCALC, TRIG, CHOLHDL, LDLDIRECT in the last 72 hours. Thyroid Function Tests: No results for input(s): TSH, T4TOTAL, FREET4, T3FREE, THYROIDAB in the last 72 hours. Anemia Panel: No results for input(s): VITAMINB12, FOLATE, FERRITIN, TIBC, IRON, RETICCTPCT in the last 72 hours. Sepsis Labs: No results for input(s): PROCALCITON, LATICACIDVEN in the last 168 hours.  Recent Results (from the past 240 hour(s))  SARS Coronavirus 2 by RT PCR (hospital order, performed in [facility] hospital lab) Nasopharyngeal Nasopharyngeal Swab     Status: None   Collection Time: 02/03/20 10:29 AM   Specimen: Nasopharyngeal Swab  Result Value Ref Range Status   SARS Coronavirus 2 NEGATIVE NEGATIVE Final    Comment: (NOTE) SARS-CoV-2 target nucleic acids are NOT DETECTED. The SARS-CoV-2 RNA is generally detectable in upper and lower respiratory specimens during the acute phase of infection. The lowest concentration of SARS-CoV-2 viral copies this assay can detect is 250 copies / mL. A negative result does not preclude SARS-CoV-2 infection and should not be used as the sole basis for treatment or other patient management decisions.  A negative result may occur with improper specimen collection / handling, submission of specimen other than nasopharyngeal swab,  presence of viral mutation(s) within the areas targeted by this assay, and inadequate number of viral copies (<250 copies / mL). A negative result must be combined with clinical observations, patient history, and epidemiological information. Fact Sheet for Patients:   StrictlyIdeas.no Fact Sheet for Healthcare Providers: BankingDealers.co.za This test is not yet approved or cleared  by the [place] FDA and has been authorized for detection and/or diagnosis of SARS-CoV-2 by FDA under an Emergency Use Authorization (EUA).  This EUA will remain in effect (meaning this test can be used) for the duration of the COVID-19 declaration under Section 564(b)(1) of the Act, 21 U.S.C. section 360bbb-3(b)(1), unless the authorization is terminated or revoked sooner. Performed at [facility], [address], [place], [place] [address]   Surgical PCR screen     Status: None   Collection Time: 02/05/20  8:06 PM   Specimen: Nasal Mucosa; Nasal Swab  Result Value Ref Range Status   MRSA, PCR NEGATIVE NEGATIVE Final   Staphylococcus aureus NEGATIVE NEGATIVE Final    Comment: (NOTE) The Xpert SA Assay (FDA approved for NASAL specimens in patients 50 years of age and older), is one component of a comprehensive surveillance program. It is not intended to diagnose infection nor to guide or monitor treatment. Performed at [facility], [place] [address], [place], [place] [address]   CSF culture     Status: None (Preliminary result)   Collection Time: 02/06/20  3:00 PM   Specimen: PATH Cytology CSF; Cerebrospinal Fluid  Result Value Ref Range Status   Specimen Description CSF  Final   Special Requests NONE  Final   Gram Stain NO WBC SEEN NO ORGANISMS SEEN   Final   Culture   Final    NO GROWTH 3 DAYS Performed at [facility], [place] [address], [place], [place] [address]    Report Status PENDING  Incomplete  Radiology Studies: CT HEAD WO CONTRAST  Result Date: 02/09/2020 CLINICAL DATA:  Post brain biopsy, shunted hydrocephalus EXAM: CT HEAD WITHOUT CONTRAST TECHNIQUE: Contiguous axial images were obtained from the base of the skull through the vertex without intravenous contrast. COMPARISON:  02/07/2020 FINDINGS: Brain: Postoperative changes are again identified with edema and trace blood products along the right frontal biopsy tract extending to the anterior corpus callosum mass. There is decreased extra-axial air. Decreased layering hemorrhage within the occipital horns. Right parietal approach ventriculostomy catheter is unchanged in position with tip crossing midline into the body of the left lateral ventricle. Stable ventricle caliber. Lateral ventricles remain mildly prominent. No new loss of gray-white differentiation. Vascular: No new finding. Skull: Calvarium is unremarkable. Sinuses/Orbits: No acute finding. Other: None. IMPRESSION: Evolving post biopsy changes.  No new hemorrhage. Stable position of right parietal approach ventriculostomy catheter. Stable ventricle caliber. Electronically Signed   By: Macy Mis M.D.   On: 02/09/2020 08:53        Scheduled Meds: . Chlorhexidine Gluconate Cloth  6 each Topical Daily  . dexamethasone  4 mg Oral Q8H  . docusate sodium  100 mg Oral BID  . enoxaparin (LOVENOX) injection  40 mg Subcutaneous Q24H  . insulin aspart  0-9 Units Subcutaneous Q4H  . insulin detemir  10 Units Subcutaneous QHS  . levETIRAcetam  500 mg Oral BID  . pantoprazole  40 mg Oral QHS  . potassium chloride  20 mEq Oral BID  . senna  1 tablet Oral BID   Continuous Infusions:    LOS: 6 days   Total time spent: 25 minutes   Barb Merino, MD 02/09/2020, 1:20 PM

## 2020-02-10 DIAGNOSIS — R410 Disorientation, unspecified: Secondary | ICD-10-CM

## 2020-02-10 LAB — CSF CULTURE W GRAM STAIN
Culture: NO GROWTH
Gram Stain: NONE SEEN

## 2020-02-10 LAB — GLUCOSE, CAPILLARY
Glucose-Capillary: 140 mg/dL — ABNORMAL HIGH (ref 70–99)
Glucose-Capillary: 162 mg/dL — ABNORMAL HIGH (ref 70–99)
Glucose-Capillary: 114 mg/dL — ABNORMAL HIGH (ref 70–99)

## 2020-02-10 MED ORDER — INSULIN PEN NEEDLE 31G X 5 MM MISC: 1.0000 | Freq: Three times a day (TID) | 0 refills | Status: DC

## 2020-02-10 MED ORDER — INSULIN ASPART 100 UNIT/ML FLEXPEN: 1.0000 [IU] | PEN_INJECTOR | Freq: Three times a day (TID) | SUBCUTANEOUS | 11 refills | Status: DC

## 2020-02-10 MED ORDER — LEVETIRACETAM 500 MG PO TABS: 500.0000 mg | ORAL_TABLET | Freq: Two times a day (BID) | ORAL | 2 refills | Status: DC

## 2020-02-10 MED ORDER — BLOOD GLUCOSE MONITOR KIT: PACK | 0 refills | Status: DC

## 2020-02-10 MED ORDER — DEXAMETHASONE 4 MG PO TABS: ORAL_TABLET | ORAL | 0 refills | Status: DC

## 2020-02-10 MED ORDER — PANTOPRAZOLE SODIUM 40 MG PO TBEC: 40.0000 mg | DELAYED_RELEASE_TABLET | Freq: Every day | ORAL | 0 refills | Status: DC

## 2020-02-10 NOTE — Progress Notes (Signed)
Inpatient Diabetes Program Recommendations  AACE/ADA: New Consensus Statement on Inpatient Glycemic Control (2015)  Target Ranges:  Prepandial:   less than 140 mg/dL      Peak postprandial:   less than 180 mg/dL (1-2 hours)      Critically ill patients:  140 - 180 mg/dL   Lab Results  Component Value Date   GLUCAP 114 (H) 02/10/2020   HGBA1C 6.8 (H) 02/03/2020    Review of Glycemic Control  Received page by RN requesting demo insulin pen to teach pt insulin administration. Pen was obtained from [employer] for teaching. Sent pt video of insulin pen instructions. Pt received instructions on phone and was appreciative.   Will need insulin pen, pen needles, glucose meter kit at discharge.   Thank you. Lorenda Peck, RD, LDN, CDE Inpatient Diabetes Coordinator [phone]

## 2020-02-10 NOTE — Discharge Summary (Signed)
Physician Discharge Summary  KAELIE HENIGAN WNI:[id number] DOB: [birth year] DOA: 02/03/2020  PCP: Redmond School, MD  Admit date: 02/03/2020 Discharge date: 02/10/2020  Admitted From: home  Disposition:  Home   Recommendations for Outpatient Follow-up:  1. Follow up with PCP in 1-2 weeks 2. Follow up with Neurosurgery as scheduled next week.  Discharge Condition: stable   CODE STATUS:full code  Diet recommendation: low carb diet.  Discharge Summary: Edie Vallandingham Myersis a 64 y.o.femalewith medical history significant forvitamin D deficiency who was brought to the ED via EMS after her mother found her in the bathroom with significant confusion. She was also noted to have vomited. Apparently patient has been having severe headaches for several days. when EMS arrived, her speech was quite incoherent. No other symptoms of fevers, chills, chest pain, shortness of breath, cough, abdominal pain, or diarrhea reported by the mother. No specific weakness or numbness to the extremities or visual field deficits have been noted. ED Course:Stable vital signs are noted and patient has EKG in sinus rhythm. Serum sodium is 126 and potassium is 3.4 with glucose of 209. CT scanning of the head was immediately performed on arrival under code stroke protocol and she is noted to have an anterior hemispheric brain mass suspicious for glioma versus lymphoma. Patient then admitted to the hospital with altered mental status, brain tumor.  #1 Brain Tumor : MRI showed Interhemispheric mass centered at the genu of the corpus callosum, septum pellucidum, and fornices, with extension into the left greater than right periventricular white matter. 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 Lymphoma would be the primary differential consideration. Secondary mass effect on the foramen of Monro with lateral ventriculomegaly. No transependymal flow of CSF, significant midline shift, or herniation. 5/26, patient  underwent septostomy. Remains on Keppra for seizure prophylaxis. On steroids.  Biopsy results are pending.  Intracranial monitoring devices removed. Patient surgically stable as per surgery. Discharging home on tapering steroids and keppra for seizure prophylaxis.  #2 post ictal status post seizure-patient is more awake alert and oriented now.  Likely encephalopathy secondary to seizure.  Now on Keppra.     #3 hyponatremia resolved sodium improved.  #4 DM with hemoglobin A1c of 6.8 no prior diagnosis of diabetes.  no treatment at home. Now with use of steroids. Will dc with low dose sliding scale insulin with meals. Patient received insulin teaching, monitoring and treatment protocol at home.  #5.  Hypokalemia: Replaced and improved.  Patient medically stable and going home with family.   Discharge Diagnoses:  Active Problems:   Seizure ([place])   Altered mental status   Brain neoplasm [facility])    Discharge Instructions  Discharge Instructions    Call MD for:  redness, tenderness, or signs of infection (pain, swelling, redness, odor or green/yellow discharge around incision site)   Complete by: As directed    Call MD for:  severe uncontrolled pain   Complete by: As directed    Call MD for:  temperature >100.4   Complete by: As directed    Diet Carb Modified   Complete by: As directed    Increase activity slowly   Complete by: As directed    No dressing needed   Complete by: As directed    Keep wound clean and dry until clinic follow up     Allergies as of 02/10/2020   No Known Allergies     Medication List    STOP taking these medications   VITAMIN K PO  TAKE these medications   blood glucose meter kit and supplies Kit Check blood sugars three times a day before meals and use insulin as instructed   cholecalciferol 1000 units tablet Commonly known as: VITAMIN D Take 1,000 Units by mouth daily.   dexamethasone 4 MG tablet Commonly known as: DECADRON 1 tablet  TID for 3 days 1 tablet BID for 3 days 1 tablet once daily for 3 days   insulin aspart 100 UNIT/ML FlexPen Commonly known as: NOVOLOG Inject 1-9 Units into the skin 3 (three) times daily with meals. CBG 70 - 120: 0 units ,CBG 121 - 150: 0 unit ,CBG 151 - 200: 2 units ,CBG 201 - 250: 3 units, CBG 251 - 300: 5 units, CBG 301 - 350: 7 units, CBG 351 - 400: 9 units CBG > 400: call your doctor.   Insulin Pen Needle 31G X 5 MM Misc 1 each by Does not apply route 3 (three) times daily before meals.   levETIRAcetam 500 MG tablet Commonly known as: KEPPRA Take 1 tablet (500 mg total) by mouth 2 (two) times daily.   pantoprazole 40 MG tablet Commonly known as: PROTONIX Take 1 tablet (40 mg total) by mouth at bedtime.       No Known Allergies  Consultations:  neurosurgery   Procedures/Studies: CT Code Stroke CTA Head W/WO contrast  Result Date: 02/04/2020 CLINICAL DATA:  Follow-up examination for acute encephalopathy. EXAM: CT ANGIOGRAPHY HEAD AND NECK CT PERFUSION BRAIN TECHNIQUE: Multidetector CT imaging of the head and neck was performed using the standard protocol during bolus administration of intravenous contrast. Multiplanar CT image reconstructions and MIPs were obtained to evaluate the vascular anatomy. Carotid stenosis measurements (when applicable) are obtained utilizing NASCET criteria, using the distal internal carotid diameter as the denominator. Multiphase CT imaging of the brain was performed following IV bolus contrast injection. Subsequent parametric perfusion maps were calculated using RAPID software. CONTRAST:  157m OMNIPAQUE IOHEXOL 350 MG/ML SOLN COMPARISON:  Prior head CT from 02/03/2020 as well as brain MRI from earlier the same day. FINDINGS: CTA NECK FINDINGS Aortic arch: Visualized aortic arch of normal caliber with normal branch pattern. Minimal plaque within the arch itself. No hemodynamically significant stenosis about the origin of the great vessels. Visualized  subclavian arteries widely patent. Right carotid system: Right common carotid artery widely patent from its origin to the bifurcation without stenosis. Minimal centric calcified plaque at the right bifurcation without significant narrowing. Right ICA widely patent distally to the skull base without stenosis, dissection or occlusion. Left carotid system: Left common and internal carotid arteries widely patent without stenosis, dissection or occlusion. Vertebral arteries: Both vertebral arteries arise from the subclavian arteries. Vertebral arteries widely patent without stenosis, dissection or occlusion. Skeleton: No acute osseous abnormality. No discrete or worrisome osseous lesions. Moderate cervical spondylosis at C5-6 without high-grade stenosis. Focal sclerosis within the T4 vertebral body noted, nonspecific, but most likely benign. Other neck: No other acute soft tissue abnormality within the neck. No mass lesion or adenopathy. Upper chest: Scattered atelectatic changes noted within the visualized lungs. Visualized upper chest demonstrates no other acute finding. Review of the MIP images confirms the above findings CTA HEAD FINDINGS Anterior circulation: Internal carotid arteries patent to the termini without stenosis or other abnormality. A1 segments patent. Normal anterior communicator complex. Anterior cerebral arteries patent to their distal aspects without stenosis. No M1 stenosis or occlusion. Normal MCA bifurcations. Distal MCA branches well perfused and symmetric. Posterior circulation: Both vertebral arteries widely patent  to the vertebrobasilar junction. Both picas patent. Basilar widely patent to its distal aspect without stenosis. Superior cerebellar and posterior cerebral arteries widely patent bilaterally. Venous sinuses: Grossly patent allowing for timing the contrast bolus. Anatomic variants: None significant. Review of the MIP images confirms the above findings CT Brain Perfusion Findings:  ASPECTS: Not applicable. CBF (<30%) Volume: 79m Perfusion (Tmax>6.0s) volume: 048mMismatch Volume: 54m63mnfarction Location:Negative CT perfusion. IMPRESSION: 1. Normal CTA of the head and neck. No large vessel occlusion, hemodynamically significant stenosis, or other acute vascular abnormality. 2. Negative CT perfusion. Electronically Signed   By: BenJeannine BogaD.   On: 02/04/2020 03:22   CT HEAD WO CONTRAST  Result Date: 02/09/2020 CLINICAL DATA:  Post brain biopsy, shunted hydrocephalus EXAM: CT HEAD WITHOUT CONTRAST TECHNIQUE: Contiguous axial images were obtained from the base of the skull through the vertex without intravenous contrast. COMPARISON:  02/07/2020 FINDINGS: Brain: Postoperative changes are again identified with edema and trace blood products along the right frontal biopsy tract extending to the anterior corpus callosum mass. There is decreased extra-axial air. Decreased layering hemorrhage within the occipital horns. Right parietal approach ventriculostomy catheter is unchanged in position with tip crossing midline into the body of the left lateral ventricle. Stable ventricle caliber. Lateral ventricles remain mildly prominent. No new loss of gray-white differentiation. Vascular: No new finding. Skull: Calvarium is unremarkable. Sinuses/Orbits: No acute finding. Other: None. IMPRESSION: Evolving post biopsy changes.  No new hemorrhage. Stable position of right parietal approach ventriculostomy catheter. Stable ventricle caliber. Electronically Signed   By: PraMacy MisD.   On: 02/09/2020 08:53   CT HEAD WO CONTRAST  Result Date: 02/07/2020 CLINICAL DATA:  Postop brain biopsy EXAM: CT HEAD WITHOUT CONTRAST TECHNIQUE: Contiguous axial images were obtained from the base of the skull through the vertex without intravenous contrast. COMPARISON:  CT head 02/05/2020.  MRI head 02/04/2020 FINDINGS: Brain: Hypodense mass lesion in the anterior genu of the corpus callosum has been  biopsied. Small amount of hemorrhage is seen near the biopsy site. There is a needle tract through the right frontal bone. There is a small amount of blood in the occipital horns bilaterally. There has been interval placement of a right parietal shunt catheter crossing the midline into the left lateral ventricle. Ventricles remain mildly dilated and unchanged. The lateral and fourth ventricles are mildly dilated however the third ventricle is not significantly dilated. Negative for acute infarct. No midline shift. Mild amount of subdural gas is present in the right frontal region. Small amount of gas in the right frontal horn. Vascular: Negative for hyperdense vessel Skull: Right parietal burr hole for shunt placement. Right frontal bone needle tract for biopsy. Sinuses/Orbits: Mild mucosal edema paranasal sinuses. Negative orbit. Other: None IMPRESSION: 1. Interval biopsy of hypodense mass in the anterior genu corpus callosum. Small amount of hemorrhage at the biopsy site. Small amount of hemorrhage in the lateral ventricles bilaterally. 2. Mild dilatation of the lateral and fourth ventricles unchanged from pre biopsy study. Third ventricle nondilated. Interval placement of right parietal shunt catheter crossing the midline into the left lateral ventricle. Electronically Signed   By: ChaFranchot GalloD.   On: 02/07/2020 08:28   CT HEAD WO CONTRAST  Result Date: 02/05/2020 CLINICAL DATA:  Brain mass EXAM: CT HEAD WITHOUT CONTRAST TECHNIQUE: Contiguous axial images were obtained from the base of the skull through the vertex without intravenous contrast. COMPARISON:  02/03/2020 FINDINGS: Brain: Mixed density but primarily hypoattenuating lesion involving the corpus callosum,  septum pellucidum/fornices, and adjacent periventricular white matter is more completely evaluated on recent MR imaging. Stable enlargement of the lateral ventricle secondary to mass effect on the foramen of Monro. Vascular: No new findings.  Skull: No new findings. Sinuses/Orbits: No new findings. Other: None. IMPRESSION: Stable interhemispheric mass more completely evaluated on prior MRI with corresponding hyperperfusion. Primary differential consideration remains high-grade glioma. Electronically Signed   By: Macy Mis M.D.   On: 02/05/2020 14:23   CT Code Stroke CTA Neck W/WO contrast  Result Date: 02/04/2020 CLINICAL DATA:  Follow-up examination for acute encephalopathy. EXAM: CT ANGIOGRAPHY HEAD AND NECK CT PERFUSION BRAIN TECHNIQUE: Multidetector CT imaging of the head and neck was performed using the standard protocol during bolus administration of intravenous contrast. Multiplanar CT image reconstructions and MIPs were obtained to evaluate the vascular anatomy. Carotid stenosis measurements (when applicable) are obtained utilizing NASCET criteria, using the distal internal carotid diameter as the denominator. Multiphase CT imaging of the brain was performed following IV bolus contrast injection. Subsequent parametric perfusion maps were calculated using RAPID software. CONTRAST:  141m OMNIPAQUE IOHEXOL 350 MG/ML SOLN COMPARISON:  Prior head CT from 02/03/2020 as well as brain MRI from earlier the same day. FINDINGS: CTA NECK FINDINGS Aortic arch: Visualized aortic arch of normal caliber with normal branch pattern. Minimal plaque within the arch itself. No hemodynamically significant stenosis about the origin of the great vessels. Visualized subclavian arteries widely patent. Right carotid system: Right common carotid artery widely patent from its origin to the bifurcation without stenosis. Minimal centric calcified plaque at the right bifurcation without significant narrowing. Right ICA widely patent distally to the skull base without stenosis, dissection or occlusion. Left carotid system: Left common and internal carotid arteries widely patent without stenosis, dissection or occlusion. Vertebral arteries: Both vertebral arteries arise  from the subclavian arteries. Vertebral arteries widely patent without stenosis, dissection or occlusion. Skeleton: No acute osseous abnormality. No discrete or worrisome osseous lesions. Moderate cervical spondylosis at C5-6 without high-grade stenosis. Focal sclerosis within the T4 vertebral body noted, nonspecific, but most likely benign. Other neck: No other acute soft tissue abnormality within the neck. No mass lesion or adenopathy. Upper chest: Scattered atelectatic changes noted within the visualized lungs. Visualized upper chest demonstrates no other acute finding. Review of the MIP images confirms the above findings CTA HEAD FINDINGS Anterior circulation: Internal carotid arteries patent to the termini without stenosis or other abnormality. A1 segments patent. Normal anterior communicator complex. Anterior cerebral arteries patent to their distal aspects without stenosis. No M1 stenosis or occlusion. Normal MCA bifurcations. Distal MCA branches well perfused and symmetric. Posterior circulation: Both vertebral arteries widely patent to the vertebrobasilar junction. Both picas patent. Basilar widely patent to its distal aspect without stenosis. Superior cerebellar and posterior cerebral arteries widely patent bilaterally. Venous sinuses: Grossly patent allowing for timing the contrast bolus. Anatomic variants: None significant. Review of the MIP images confirms the above findings CT Brain Perfusion Findings: ASPECTS: Not applicable. CBF (<30%) Volume: 065mPerfusion (Tmax>6.0s) volume: 92m16mismatch Volume: 92mL47mfarction Location:Negative CT perfusion. IMPRESSION: 1. Normal CTA of the head and neck. No large vessel occlusion, hemodynamically significant stenosis, or other acute vascular abnormality. 2. Negative CT perfusion. Electronically Signed   By: BenjJeannine Boga.   On: 02/04/2020 03:22   CT CHEST W CONTRAST  Result Date: 02/05/2020 CLINICAL DATA:  64 y24r old female with history of brain  cancer. Staging examination. EXAM: CT CHEST, ABDOMEN, AND PELVIS WITH CONTRAST TECHNIQUE: Multidetector CT imaging of  the chest, abdomen and pelvis was performed following the standard protocol during bolus administration of intravenous contrast. CONTRAST:  147m OMNIPAQUE IOHEXOL 300 MG/ML  SOLN COMPARISON:  No priors. FINDINGS: CT CHEST FINDINGS Cardiovascular: Heart size is normal. Small amount of pericardial fluid in a superior pericardial recess. No other significant pericardial thickening or calcification. Aortic atherosclerosis. No definite calcified atherosclerotic plaque identified in the coronary arteries. Mediastinum/Nodes: No pathologically enlarged mediastinal or hilar lymph nodes. Esophagus is unremarkable in appearance. No axillary lymphadenopathy. Lungs/Pleura: 3 mm subpleural nodule in the periphery of the right upper lobe (axial image 31 of series 5). No other larger more suspicious appearing pulmonary nodules or masses are noted. No acute consolidative airspace disease. Small amount of dependent atelectasis and/or scarring in the right lower lobe posteriorly. Trace left pleural effusion. No right pleural effusion. Musculoskeletal: Multiple old healed right-sided rib fractures. There are no aggressive appearing lytic or blastic lesions noted in the visualized portions of the skeleton. CT ABDOMEN PELVIS FINDINGS Hepatobiliary: No suspicious cystic or solid hepatic lesions. No intra or extrahepatic biliary ductal dilatation. High attenuation material filling the lumen of the gallbladder, likely related to vicarious excretion of contrast material from yesterday's contrast enhanced CT examinations. No surrounding inflammatory changes. Gallbladder is not distended. Pancreas: No pancreatic mass. No pancreatic ductal dilatation. No pancreatic or peripancreatic fluid collections or inflammatory changes. Spleen: Unremarkable. Adrenals/Urinary Tract: Bilateral kidneys and adrenal glands are normal in  appearance. No hydroureteronephrosis. Urinary bladder is filled with iodinated contrast material, and along the posterolateral aspect of the urinary bladder wall on the right side there are several small filling defects abutting the wall measuring up to 6 mm (sagittal image 56 of series 7). Stomach/Bowel: The appearance of the stomach is normal. No pathologic dilatation of small bowel or colon. Normal appendix. Vascular/Lymphatic: Aortic atherosclerosis, without evidence of aneurysm or dissection in the abdominal or pelvic vasculature. No lymphadenopathy noted in the abdomen or pelvis. Reproductive: Uterus and ovaries are unremarkable in appearance. Other: No significant volume of ascites.  No pneumoperitoneum. Musculoskeletal: There are no aggressive appearing lytic or blastic lesions noted in the visualized portions of the skeleton. IMPRESSION: 1. No definitive evidence to suggest metastatic disease to the chest, abdomen or pelvis. 2. Small filling defects in the posterolateral aspect of the urinary bladder on the right side. This could represent some debris, however, these are intimately associated with the bladder wall such that small urothelial lesions are difficult to entirely exclude. Referral to Urology for further evaluation is recommended in the near future. 3. Small 3 mm subpleural nodule in the periphery of the right upper lobe is nonspecific, but statistically likely a benign subpleural lymph node. No follow-up needed if patient is low-risk. Non-contrast chest CT can be considered in 12 months if patient is high-risk. This recommendation follows the consensus statement: Guidelines for Management of Incidental Pulmonary Nodules Detected on CT Images: From the Fleischner Society 2017; Radiology 2017; 284:228-243. 4. Trace left pleural effusion. 5. Aortic atherosclerosis. 6. Additional incidental findings, as above. Electronically Signed   By: DVinnie LangtonM.D.   On: 02/05/2020 08:15   MR BRAIN W WO  CONTRAST  Result Date: 02/04/2020 CLINICAL DATA:  Initial evaluation for acute encephalopathy, migraine, mass on prior head CT. EXAM: MRI HEAD WITHOUT AND WITH CONTRAST TECHNIQUE: Multiplanar, multiecho pulse sequences of the brain and surrounding structures were obtained without and with intravenous contrast. CONTRAST:  7.562mGADAVIST GADOBUTROL 1 MMOL/ML IV SOLN COMPARISON:  Prior head CT from 02/03/2020. FINDINGS: Brain:  There is an infiltrative mass centered at the anterior inter hemispheric fissure, with extension along the fornices, septum pellucidum, and anterior genu of the corpus callosum. Lesion demonstrates heterogeneous T2/FLAIR signal intensity with fairly avid heterogeneous postcontrast enhancement. Exact measurements of this lesion difficult given location and infiltrative nature. Mild central restricted diffusion related to necrosis and/or hypercellularity. Extension into the left greater than right periventricular white matter. The adjacent frontal horns are somewhat compressed and irregular by the adjacent tumor. Mass effect on the adjacent foramen of Monro with associated lateral ventriculomegaly. No transependymal flow of CSF or herniation. No distant subarachnoid seeding. Trace right-to-left shift at the septum pellucidum. No other mass lesion or abnormal enhancement. No evidence for acute or subacute infarct. Gray-white matter differentiation maintained. No encephalomalacia to suggest chronic cortical infarction. No other evidence for acute or chronic intracranial hemorrhage. No made of a partially empty sella. Note made of a 1.8 cm arachnoid cyst at the left middle cranial fossa. Vascular: Major intracranial vascular flow voids are maintained. Skull and upper cervical spine: Craniocervical junction within normal limits. Upper cervical spine normal. Bone marrow signal intensity within normal limits. No focal marrow replacing lesion. No scalp soft tissue abnormality. Sinuses/Orbits: Globes and  orbital soft tissues within normal limits are otherwise clear. No significant mastoid effusion. Inner ear structures grossly normal. Noted within the ethmoidal air cells. Paranasal sinuses Other: None. IMPRESSION: 1. Interhemispheric mass centered at the genu of the corpus callosum, septum pellucidum, and fornices, with extension into the left greater than right periventricular white matter. 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 Lymphoma would be the primary differential consideration. 2. Secondary mass effect on the foramen of Monro with lateral ventriculomegaly. No transependymal flow of CSF, significant midline shift, or herniation. Electronically Signed   By: Jeannine Boga M.D.   On: 02/04/2020 02:37   CT ABDOMEN PELVIS W CONTRAST  Result Date: 02/05/2020 CLINICAL DATA:  64 year old female with history of brain cancer. Staging examination. EXAM: CT CHEST, ABDOMEN, AND PELVIS WITH CONTRAST TECHNIQUE: Multidetector CT imaging of the chest, abdomen and pelvis was performed following the standard protocol during bolus administration of intravenous contrast. CONTRAST:  143m OMNIPAQUE IOHEXOL 300 MG/ML  SOLN COMPARISON:  No priors. FINDINGS: CT CHEST FINDINGS Cardiovascular: Heart size is normal. Small amount of pericardial fluid in a superior pericardial recess. No other significant pericardial thickening or calcification. Aortic atherosclerosis. No definite calcified atherosclerotic plaque identified in the coronary arteries. Mediastinum/Nodes: No pathologically enlarged mediastinal or hilar lymph nodes. Esophagus is unremarkable in appearance. No axillary lymphadenopathy. Lungs/Pleura: 3 mm subpleural nodule in the periphery of the right upper lobe (axial image 31 of series 5). No other larger more suspicious appearing pulmonary nodules or masses are noted. No acute consolidative airspace disease. Small amount of dependent atelectasis and/or scarring in the right lower lobe posteriorly.  Trace left pleural effusion. No right pleural effusion. Musculoskeletal: Multiple old healed right-sided rib fractures. There are no aggressive appearing lytic or blastic lesions noted in the visualized portions of the skeleton. CT ABDOMEN PELVIS FINDINGS Hepatobiliary: No suspicious cystic or solid hepatic lesions. No intra or extrahepatic biliary ductal dilatation. High attenuation material filling the lumen of the gallbladder, likely related to vicarious excretion of contrast material from yesterday's contrast enhanced CT examinations. No surrounding inflammatory changes. Gallbladder is not distended. Pancreas: No pancreatic mass. No pancreatic ductal dilatation. No pancreatic or peripancreatic fluid collections or inflammatory changes. Spleen: Unremarkable. Adrenals/Urinary Tract: Bilateral kidneys and adrenal glands are normal in appearance. No hydroureteronephrosis. Urinary  bladder is filled with iodinated contrast material, and along the posterolateral aspect of the urinary bladder wall on the right side there are several small filling defects abutting the wall measuring up to 6 mm (sagittal image 56 of series 7). Stomach/Bowel: The appearance of the stomach is normal. No pathologic dilatation of small bowel or colon. Normal appendix. Vascular/Lymphatic: Aortic atherosclerosis, without evidence of aneurysm or dissection in the abdominal or pelvic vasculature. No lymphadenopathy noted in the abdomen or pelvis. Reproductive: Uterus and ovaries are unremarkable in appearance. Other: No significant volume of ascites.  No pneumoperitoneum. Musculoskeletal: There are no aggressive appearing lytic or blastic lesions noted in the visualized portions of the skeleton. IMPRESSION: 1. No definitive evidence to suggest metastatic disease to the chest, abdomen or pelvis. 2. Small filling defects in the posterolateral aspect of the urinary bladder on the right side. This could represent some debris, however, these are  intimately associated with the bladder wall such that small urothelial lesions are difficult to entirely exclude. Referral to Urology for further evaluation is recommended in the near future. 3. Small 3 mm subpleural nodule in the periphery of the right upper lobe is nonspecific, but statistically likely a benign subpleural lymph node. No follow-up needed if patient is low-risk. Non-contrast chest CT can be considered in 12 months if patient is high-risk. This recommendation follows the consensus statement: Guidelines for Management of Incidental Pulmonary Nodules Detected on CT Images: From the Fleischner Society 2017; Radiology 2017; 284:228-243. 4. Trace left pleural effusion. 5. Aortic atherosclerosis. 6. Additional incidental findings, as above. Electronically Signed   By: Vinnie Langton M.D.   On: 02/05/2020 08:15   CT Code Stroke Cerebral Perfusion with contrast  Result Date: 02/04/2020 CLINICAL DATA:  Follow-up examination for acute encephalopathy. EXAM: CT ANGIOGRAPHY HEAD AND NECK CT PERFUSION BRAIN TECHNIQUE: Multidetector CT imaging of the head and neck was performed using the standard protocol during bolus administration of intravenous contrast. Multiplanar CT image reconstructions and MIPs were obtained to evaluate the vascular anatomy. Carotid stenosis measurements (when applicable) are obtained utilizing NASCET criteria, using the distal internal carotid diameter as the denominator. Multiphase CT imaging of the brain was performed following IV bolus contrast injection. Subsequent parametric perfusion maps were calculated using RAPID software. CONTRAST:  140m OMNIPAQUE IOHEXOL 350 MG/ML SOLN COMPARISON:  Prior head CT from 02/03/2020 as well as brain MRI from earlier the same day. FINDINGS: CTA NECK FINDINGS Aortic arch: Visualized aortic arch of normal caliber with normal branch pattern. Minimal plaque within the arch itself. No hemodynamically significant stenosis about the origin of the  great vessels. Visualized subclavian arteries widely patent. Right carotid system: Right common carotid artery widely patent from its origin to the bifurcation without stenosis. Minimal centric calcified plaque at the right bifurcation without significant narrowing. Right ICA widely patent distally to the skull base without stenosis, dissection or occlusion. Left carotid system: Left common and internal carotid arteries widely patent without stenosis, dissection or occlusion. Vertebral arteries: Both vertebral arteries arise from the subclavian arteries. Vertebral arteries widely patent without stenosis, dissection or occlusion. Skeleton: No acute osseous abnormality. No discrete or worrisome osseous lesions. Moderate cervical spondylosis at C5-6 without high-grade stenosis. Focal sclerosis within the T4 vertebral body noted, nonspecific, but most likely benign. Other neck: No other acute soft tissue abnormality within the neck. No mass lesion or adenopathy. Upper chest: Scattered atelectatic changes noted within the visualized lungs. Visualized upper chest demonstrates no other acute finding. Review of the MIP images confirms  the above findings CTA HEAD FINDINGS Anterior circulation: Internal carotid arteries patent to the termini without stenosis or other abnormality. A1 segments patent. Normal anterior communicator complex. Anterior cerebral arteries patent to their distal aspects without stenosis. No M1 stenosis or occlusion. Normal MCA bifurcations. Distal MCA branches well perfused and symmetric. Posterior circulation: Both vertebral arteries widely patent to the vertebrobasilar junction. Both picas patent. Basilar widely patent to its distal aspect without stenosis. Superior cerebellar and posterior cerebral arteries widely patent bilaterally. Venous sinuses: Grossly patent allowing for timing the contrast bolus. Anatomic variants: None significant. Review of the MIP images confirms the above findings CT  Brain Perfusion Findings: ASPECTS: Not applicable. CBF (<30%) Volume: 55m Perfusion (Tmax>6.0s) volume: 048mMismatch Volume: 29m54mnfarction Location:Negative CT perfusion. IMPRESSION: 1. Normal CTA of the head and neck. No large vessel occlusion, hemodynamically significant stenosis, or other acute vascular abnormality. 2. Negative CT perfusion. Electronically Signed   By: BenJeannine BogaD.   On: 02/04/2020 03:22   DG Chest Port 1 View  Result Date: 02/03/2020 CLINICAL DATA:  Altered mental status EXAM: PORTABLE CHEST 1 VIEW COMPARISON:  Partial comparison to limited CT chest dated 03/08/2012 FINDINGS: Mild increased interstitial markings. Left lower lobe opacity, likely atelectasis. No pleural effusion or pneumothorax. The heart is normal in size. Old right 3rd and 4th rib fracture deformities. IMPRESSION: Mild left lower lobe opacity, likely atelectasis. Electronically Signed   By: SriJulian HyD.   On: 02/03/2020 11:41   EEG adult  Result Date: 02/04/2020 YadLora HavensD     02/04/2020  1:21 PM Patient Name: FraNIOBE DICKN: [id number] Attending: PriLora Havensferring Physician/Provider: Dr. PraHeath Larkte: 02/04/2020 Duration: 25.33 mins Patient history: [age]ar old female presented with nausea vomiting headache and encephalopathy.  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  EEG evaluate for seizures. Level of alertness: awake, asleep AEDs during EEG study: Keppra Technical aspects: This EEG study was done with scalp electrodes positioned according to the 10-20 International system of electrode placement. Electrical activity was acquired at a sampling rate of _0  and reviewed with a high frequency filter of _1  and a low frequency filter of _2 . EEG data were recorded continuously and digitally stored. Description: The posterior dominant rhythm consists of 9-10 Hz activity of moderate voltage (25-35 uV) seen predominantly in  posterior head regions, symmetric and reactive to eye opening and eye closing. Sleep was characterized by vertex waves, sleep spindles (12 to 14 Hz), maximal frontocentral region. EEG showed continuous generalized 5-6 Hz theta as well as intermittent 2-_3  delta slowing. Hyperventilation and photic stimulation were not performed.   ABNORMALITY -Continuous slow, generalized IMPRESSION: This study is suggestive of mild to moderate diffuse encephalopathy, nonspecific etiology. No seizures or epileptiform discharges were seen throughout the recording. PriLora HavensCT HEAD CODE STROKE WO CONTRAST  Addendum Date: 02/03/2020   ADDENDUM REPORT: 02/03/2020 10:18 ADDENDUM: This study was inadvertently signed before the report was completed, please disregard the above. Interhemispheric mass centered at the genu of the corpus callosum, fornices, and septum pellucidum with peripheral isodensity and central low-density. Ill-defined low-density seen in the bifrontal white matter. Findings are primarily concerning for aggressive glioma or lymphoma. There is mass effect on the foramen Monro with mild lateral ventriculomegaly but no herniation or shift. No acute hemorrhage. Case discussed with Dr. ZacRogene Houstonectronically Signed   By: JonMonte FantasiaD.   On: 02/03/2020 10:18   Result Date: 02/03/2020 CLINICAL DATA:  Code stroke. EXAM: CT HEAD WITHOUT CONTRAST TECHNIQUE: Contiguous axial images were obtained from the base of the skull through the vertex without intravenous contrast. COMPARISON:  None. FINDINGS: Brain: Masslike appearance at the anterior interhemispheric fissure along the fornices, genu of the corpus callosum, and septum pellucidum. The size of peripheral isodense appearance and central low-density. Vascular: Skull: Sinuses/Orbits: Other: These results were called by telephone at the time of interpretation on 02/03/2020 at 10:10 am to provider Fredia Sorrow , who verbally acknowledged these results.  ASPECTS [facility] Stroke Program Early CT Score) - Ganglionic level infarction (caudate, lentiform nuclei, internal capsule, insula, M1-M3 cortex): - Supraganglionic infarction (M4-M6 cortex): Total score (0-10 with 10 being normal): IMPRESSION: 1. 2. ASPECTS is Electronically Signed: By: Monte Fantasia M.D. On: 02/03/2020 10:12     Subjective: Patient was seen and examined.  Surgeon told her she can go home and she was very excited.  Denies any nausea vomiting.  She occasionally gets headache when moving her head suddenly.  No vision problems.  Very much eager to go home.   Discharge Exam: Vitals:   02/10/20 0400 02/10/20 0800  BP:    Pulse:    Resp:    Temp: 98.4 F (36.9 C) 98.3 F (36.8 C)  SpO2:     Vitals:   02/10/20 0200 02/10/20 0300 02/10/20 0400 02/10/20 0800  BP: 115/66 123/63    Pulse: (!) 47 (!) 48    Resp: 18 18    Temp:   98.4 F (36.9 C) 98.3 F (36.8 C)  TempSrc:   Oral Oral  SpO2: 90% 90%    Weight:      Height:        General: Pt is alert, awake, not in acute distress, on room air and walking around the room. Surgical incision clean and dry. Cardiovascular: RRR, S1/S2 +, no rubs, no gallops Respiratory: CTA bilaterally, no wheezing, no rhonchi Abdominal: Soft, NT, ND, bowel sounds + Extremities: no edema, no cyanosis    The results of significant diagnostics from this hospitalization (including imaging, microbiology, ancillary and laboratory) are listed below for reference.     Microbiology: Recent Results (from the past 240 hour(s))  SARS Coronavirus 2 by RT PCR (hospital order, performed in [facility] hospital lab) Nasopharyngeal Nasopharyngeal Swab     Status: None   Collection Time: 02/03/20 10:29 AM   Specimen: Nasopharyngeal Swab  Result Value Ref Range Status   SARS Coronavirus 2 NEGATIVE NEGATIVE Final    Comment: (NOTE) SARS-CoV-2 target nucleic acids are NOT DETECTED. The SARS-CoV-2 RNA is generally detectable in upper and  lower respiratory specimens during the acute phase of infection. The lowest concentration of SARS-CoV-2 viral copies this assay can detect is 250 copies / mL. A negative result does not preclude SARS-CoV-2 infection and should not be used as the sole basis for treatment or other patient management decisions.  A negative result may occur with improper specimen collection / handling, submission of specimen other than nasopharyngeal swab, presence of viral mutation(s) within the areas targeted by this assay, and inadequate number of viral copies (<250 copies / mL). A negative result must be combined with clinical observations, patient history, and epidemiological information. Fact Sheet for Patients:   StrictlyIdeas.no Fact Sheet for Healthcare Providers: BankingDealers.co.za This test is not yet approved or cleared  by the [place] FDA and has been authorized for detection and/or diagnosis of SARS-CoV-2 by FDA under an Emergency Use Authorization (EUA).  This EUA will remain  in effect (meaning this test can be used) for the duration of the COVID-19 declaration under Section 564(b)(1) of the Act, 21 U.S.C. section 360bbb-3(b)(1), unless the authorization is terminated or revoked sooner. Performed at [facility], [address], [place], [place] [address]   Surgical PCR screen     Status: None   Collection Time: 02/05/20  8:06 PM   Specimen: Nasal Mucosa; Nasal Swab  Result Value Ref Range Status   MRSA, PCR NEGATIVE NEGATIVE Final   Staphylococcus aureus NEGATIVE NEGATIVE Final    Comment: (NOTE) The Xpert SA Assay (FDA approved for NASAL specimens in patients 32 years of age and older), is one component of a comprehensive surveillance program. It is not intended to diagnose infection nor to guide or monitor treatment. Performed at [facility], [place] [address], [place], [place] [address]   CSF culture     Status:  None   Collection Time: 02/06/20  3:00 PM   Specimen: PATH Cytology CSF; Cerebrospinal Fluid  Result Value Ref Range Status   Specimen Description CSF  Final   Special Requests NONE  Final   Gram Stain NO WBC SEEN NO ORGANISMS SEEN   Final   Culture   Final    NO GROWTH 3 DAYS Performed at [facility], [place] [address], [place], [place] [address]    Report Status 02/10/2020 FINAL  Final     Labs: BNP (last 3 results) No results for input(s): BNP in the last 8760 hours. Basic Metabolic Panel: Recent Labs  Lab 02/03/20 1030 02/03/20 1030 02/04/20 1046 02/04/20 1046 02/06/20 0422 02/06/20 0824 02/06/20 1315 02/06/20 1527 02/07/20 0500 02/09/20 0631  NA 126*   < > 137   < > 138  --  141 141 139 142  K 3.4*   < > 3.8   < > 3.0*  --  3.2* 3.1* 3.7 3.2*  CL 90*  --  104  --  102  --   --   --  103 102  CO2 22  --  26  --  23  --   --   --  21* 28  GLUCOSE 209*  --  156*  --  85  --   --   --  184* 100*  BUN 17  --  12  --  10  --   --   --  10 17  CREATININE 0.51  --  0.62  --  0.57  --   --   --  0.61 0.70  CALCIUM 9.1  --  9.1  --  8.7*  --   --   --  8.6* 9.0  MG  --   --   --   --   --  1.9  --   --   --   --    < > = values in this interval not displayed.   Liver Function Tests: Recent Labs  Lab 02/03/20 1030 02/04/20 1046  AST 26 21  ALT 26 23  ALKPHOS 70 57  BILITOT 0.9 0.8  PROT 7.2 6.0*  ALBUMIN 4.4 3.3*   No results for input(s): LIPASE, AMYLASE in the last 168 hours. No results for input(s): AMMONIA in the last 168 hours. CBC: Recent Labs  Lab 02/03/20 1030 02/03/20 1030 02/04/20 1046 02/06/20 1315 02/06/20 1527 02/06/20 1730 02/07/20 0500  WBC 9.1  --  7.8  --   --  10.7* 12.1*  NEUTROABS 8.3*  --   --   --   --  9.6*  --   HGB 13.4   < > 12.7 12.2 12.2 13.6 12.5  HCT 40.0   < > 38.1 36.0 36.0 41.5 37.4  MCV 88.7  --  89.4  --   --  90.6 89.0  PLT 272  --  220  --   --  232 242   < > = values in this interval not displayed.    Cardiac Enzymes: No results for input(s): CKTOTAL, CKMB, CKMBINDEX, TROPONINI in the last 168 hours. BNP: Invalid input(s): POCBNP CBG: Recent Labs  Lab 02/09/20 1619 02/09/20 1935 02/09/20 2339 02/10/20 0356 02/10/20 0825  GLUCAP 139* 212* 107* 140* 162*   D-Dimer No results for input(s): DDIMER in the last 72 hours. Hgb A1c No results for input(s): HGBA1C in the last 72 hours. Lipid Profile No results for input(s): CHOL, HDL, LDLCALC, TRIG, CHOLHDL, LDLDIRECT in the last 72 hours. Thyroid function studies No results for input(s): TSH, T4TOTAL, T3FREE, THYROIDAB in the last 72 hours.  Invalid input(s): FREET3 Anemia work up No results for input(s): VITAMINB12, FOLATE, FERRITIN, TIBC, IRON, RETICCTPCT in the last 72 hours. Urinalysis    Component Value Date/Time   COLORURINE YELLOW 02/03/2020 1640   APPEARANCEUR CLEAR 02/03/2020 1640   LABSPEC 1.016 02/03/2020 1640   PHURINE 7.0 02/03/2020 1640   GLUCOSEU 150 (A) 02/03/2020 1640   HGBUR NEGATIVE 02/03/2020 1640   BILIRUBINUR NEGATIVE 02/03/2020 1640   KETONESUR 20 (A) 02/03/2020 1640   PROTEINUR NEGATIVE 02/03/2020 1640   NITRITE NEGATIVE 02/03/2020 1640   LEUKOCYTESUR NEGATIVE 02/03/2020 1640   Sepsis Labs Invalid input(s): PROCALCITONIN,  WBC,  LACTICIDVEN Microbiology Recent Results (from the past 240 hour(s))  SARS Coronavirus 2 by RT PCR (hospital order, performed in [place] hospital lab) Nasopharyngeal Nasopharyngeal Swab     Status: None   Collection Time: 02/03/20 10:29 AM   Specimen: Nasopharyngeal Swab  Result Value Ref Range Status   SARS Coronavirus 2 NEGATIVE NEGATIVE Final    Comment: (NOTE) SARS-CoV-2 target nucleic acids are NOT DETECTED. The SARS-CoV-2 RNA is generally detectable in upper and lower respiratory specimens during the acute phase of infection. The lowest concentration of SARS-CoV-2 viral copies this assay can detect is 250 copies / mL. A negative result does not preclude  SARS-CoV-2 infection and should not be used as the sole basis for treatment or other patient management decisions.  A negative result may occur with improper specimen collection / handling, submission of specimen other than nasopharyngeal swab, presence of viral mutation(s) within the areas targeted by this assay, and inadequate number of viral copies (<250 copies / mL). A negative result must be combined with clinical observations, patient history, and epidemiological information. Fact Sheet for Patients:   StrictlyIdeas.no Fact Sheet for Healthcare Providers: BankingDealers.co.za This test is not yet approved or cleared  by the [place] FDA and has been authorized for detection and/or diagnosis of SARS-CoV-2 by FDA under an Emergency Use Authorization (EUA).  This EUA will remain in effect (meaning this test can be used) for the duration of the COVID-19 declaration under Section 564(b)(1) of the Act, 21 U.S.C. section 360bbb-3(b)(1), unless the authorization is terminated or revoked sooner. Performed at [facility], [address] [place], [place] [address]   Surgical PCR screen     Status: None   Collection Time: 02/05/20  8:06 PM   Specimen: Nasal Mucosa; Nasal Swab  Result Value Ref Range Status   MRSA, PCR NEGATIVE NEGATIVE Final   Staphylococcus aureus NEGATIVE  NEGATIVE Final    Comment: (NOTE) The Xpert SA Assay (FDA approved for NASAL specimens in patients 59 years of age and older), is one component of a comprehensive surveillance program. It is not intended to diagnose infection nor to guide or monitor treatment. Performed at [facility], [place] [address] [place], [place] [address]   CSF culture     Status: None   Collection Time: 02/06/20  3:00 PM   Specimen: PATH Cytology CSF; Cerebrospinal Fluid  Result Value Ref Range Status   Specimen Description CSF  Final   Special Requests NONE  Final    Gram Stain NO WBC SEEN NO ORGANISMS SEEN   Final   Culture   Final    NO GROWTH 3 DAYS Performed at [facility], [place] [address], [place], [place] [address]    Report Status 02/10/2020 FINAL  Final     Time coordinating discharge:  40 minutes  SIGNED:   Barb Merino, MD  [facility] 02/10/2020, 10:24 AM

## 2020-02-10 NOTE — Discharge Instructions (Signed)
Metastatic Brain Tumor, Adult  A metastatic brain tumor is a collection of cancer cells that develop somewhere else in the body and then spread (metastasize) to the brain and grow there. In most cases, the cancer will also have spread to other areas of the body. In most cases, the first (primary) cancer was found and treated, but treatment did not prevent the cancer cells from spreading. Signs and symptoms of metastatic brain tumor may occur months or even years after treatment for the primary cancer. In very rare cases, these symptoms may be the first sign of the primary cancer. What are the causes? This condition is caused by cancer. Among men, the most common cause of metastatic brain tumor is lung cancer. Among women, the most common cause is breast cancer. Other kinds of cancer can also cause this condition, including:  Cancer of the skin (melanoma).  Cancer of the large intestine.  Cancer of the kidney.  Cancer of the prostate. What increases the risk? You are more likely to develop this condition if you have or have had cancer. What are the signs or symptoms? The symptoms of a metastatic brain tumor are related to the location of the tumor within the brain and may include:  Headache. This is often the first symptom.  Stiff neck.  Nausea and vomiting.  Fatigue.  Loss of appetite.  Avoidance of light (photophobia).  Loss of consciousness (coma).  Loss of hearing.  Difficulty swallowing.  Changes in how you think or act.  Tingling, numbness, or weakness in part of your body.  Jerky movements you cannot control (seizures).  Problems with balance and walking.  Clumsiness.  Changes in speech or vision, such as blurred vision. How is this diagnosed? This condition may be diagnosed based on:  Your medical history.  Your symptoms.  A physical exam.  A test of your nerves and reflexes (neurological exam).  Blood tests to check for substances that indicate the  presence of cancer (tumor markers).  Imaging studies of your brain, such as: ? MRI. ? CT scan. ? PET scan.  A biopsy. This means that a small piece of brain tissue is removed and examined under a microscope. You may need to work with a health care provider who specializes in cancer treatment (oncologist). How is this treated? Treatment depends on the type of cancer you have and your overall health. It also depends on the size and location of your tumor(s). The most common treatments include:  Medicines to help control pain, nausea, and seizures.  Medicines that kill cancer cells (chemotherapy).  X-ray treatment to kill cancer cells (radiation).  A type of radiation therapy that is targeted to exact locations in the brain (stereotactic radiotherapy).  Surgery to remove brain tumors.  Occupational and physical therapy. A combination of chemotherapy and radiation may be used. Follow these instructions at home: Eating and drinking  Be sure to get enough calories and protein in your diet.  If you have a poor appetite or nausea, eat small meals often.  If directed by your health care provider, supplement your diet with protein shakes or milk shakes. Activity  Exercise regularly as told by your health care provider. Take two 10-minute walks every day, if you are able.  If recommended, use assistive devices such as a cane or walker to help you with walking. Ask to meet with a physical therapist to help you with instructions and equipment you might need.  Return to your normal activities as told by   your health care provider. Ask your health care provider what activities are safe for you.  Ask your health care provider if it is safe for you to drive. ? Do not drive if you cannot see or think clearly. ? Do not drive or use heavy machinery while taking prescription pain medicine. ? Do not drive if you have recently had a seizure. Check with your health care provider about laws that  regulate when to drive after a seizure. General instructions  Take over-the-counter and prescription medicines only as told by your health care provider.  Do not use any products that contain nicotine or tobacco, such as cigarettes and e-cigarettes. If you need help quitting, ask your health care provider.  Know what side effects of treatment to watch for. Keep records of all your treatments.  Install grab bars and railings in your home to prevent falls.  It is common to have anxiety and depression when you are coping with cancer. To help relieve anxiety or depression: ? Talk to friends and loved ones about your feelings. ? Have a good support system at home.  Keep all follow-up visits as told by your health care provider. This is important. Contact a health care provider if:  You have chills or a fever.  Your medicine is not controlling your symptoms.  Your symptoms get worse or you develop new symptoms.  You have side effects from treatment.  You are unsteady or have fallen at home.  You are having trouble caring for yourself or being cared for at home.  You feel confused, anxious, or depressed. Get help right away if:  You have a seizure.  You cannot stop vomiting.  You cannot keep down any foods or fluids.  You have sudden changes in speech or vision.  You can no longer take care of yourself at home.  You have any symptoms that are severe or do not get better with treatment. Summary  A metastatic brain tumor is a collection of cancer cells that develop somewhere else in the body and then spread to the brain and grow there. In most cases, the cancer will have also spread to other areas of the body.  Treatment depends on the type of cancer you have and your overall health. It also depends on the size and location of your tumor(s).  Contact your health care provider if you have a seizure or have any symptoms that are severe or do not get better with treatment. This  information is not intended to replace advice given to you by your health care provider. Make sure you discuss any questions you have with your health care provider. Document Revised: 08/12/2017 Document Reviewed: 10/20/2016 [employer]vier Patient Education  2020 [employer]  

## 2020-02-10 NOTE — Progress Notes (Signed)
Subjective: Patient reports doing well without complaints  Objective: Vital signs in last 24 hours: Temp:  [98 F (36.7 C)-99.7 F (37.6 C)] 98.4 F (36.9 C) (05/30 0400) Pulse Rate:  [43-69] 48 (05/30 0300) Resp:  [10-24] 18 (05/30 0300) BP: (104-141)/(56-87) 123/63 (05/30 0300) SpO2:  [89 %-100 %] 90 % (05/30 0300)  Intake/Output from previous day: 05/29 0701 - 05/30 0700 In: 240 [P.O.:240] Out: -  Intake/Output this shift: No intake/output data recorded.  Physical Exam: Awake, alert, conversant.  MAEW without drift.  Dressings CDI.  Lab Results: No results for input(s): WBC, HGB, HCT, PLT in the last 72 hours. BMET Recent Labs    02/09/20 0631  NA 142  K 3.2*  CL 102  CO2 28  GLUCOSE 100*  BUN 17  CREATININE 0.70  CALCIUM 9.0    Studies/Results: CT HEAD WO CONTRAST  Result Date: 02/09/2020 CLINICAL DATA:  Post brain biopsy, shunted hydrocephalus EXAM: CT HEAD WITHOUT CONTRAST TECHNIQUE: Contiguous axial images were obtained from the base of the skull through the vertex without intravenous contrast. COMPARISON:  02/07/2020 FINDINGS: Brain: Postoperative changes are again identified with edema and trace blood products along the right frontal biopsy tract extending to the anterior corpus callosum mass. There is decreased extra-axial air. Decreased layering hemorrhage within the occipital horns. Right parietal approach ventriculostomy catheter is unchanged in position with tip crossing midline into the body of the left lateral ventricle. Stable ventricle caliber. Lateral ventricles remain mildly prominent. No new loss of gray-white differentiation. Vascular: No new finding. Skull: Calvarium is unremarkable. Sinuses/Orbits: No acute finding. Other: None. IMPRESSION: Evolving post biopsy changes.  No new hemorrhage. Stable position of right parietal approach ventriculostomy catheter. Stable ventricle caliber. Electronically Signed   By: Macy Mis M.D.   On: 02/09/2020  08:53    Assessment/Plan: Patient is doing well.  She is mentating normally and is walking without difficulty. She can be transferred from ICU today or discharged home per Hospitalists.  Taper decadron to 4 mg TID for three days, then BID for three days, then QD for three days.  Follow up with Dr. Marcello Moores this week for staples and pathology results.    LOS: 7 days    Peggyann Shoals, MD 02/10/2020, 7:33 AM

## 2020-02-12 ENCOUNTER — Other Ambulatory Visit: Payer: Self-pay | Admitting: Radiation Therapy

## 2020-02-15 ENCOUNTER — Telehealth: Payer: Self-pay | Admitting: Internal Medicine

## 2020-02-15 NOTE — Telephone Encounter (Signed)
Received an email fro neuro navigator to schedule Alicia Mcdonald to see Dr. Mickeal Skinner. I'm unable able to reach the pt or her mother on any of the numbers in her demographics. I was able to leave a voicemail on her mobile number.

## 2020-02-18 ENCOUNTER — Inpatient Hospital Stay: Payer: BC Managed Care – PPO | Attending: Internal Medicine

## 2020-02-18 DIAGNOSIS — Z79899 Other long term (current) drug therapy: Secondary | ICD-10-CM | POA: Insufficient documentation

## 2020-02-18 DIAGNOSIS — R569 Unspecified convulsions: Secondary | ICD-10-CM | POA: Insufficient documentation

## 2020-02-18 DIAGNOSIS — Z7952 Long term (current) use of systemic steroids: Secondary | ICD-10-CM | POA: Insufficient documentation

## 2020-02-18 DIAGNOSIS — C719 Malignant neoplasm of brain, unspecified: Secondary | ICD-10-CM | POA: Insufficient documentation

## 2020-02-20 ENCOUNTER — Telehealth: Payer: Self-pay | Admitting: Radiation Therapy

## 2020-02-20 NOTE — Telephone Encounter (Signed)
I spoke with Ms. Inskeep this morning to set up a consult with Dr. Mickeal Skinner. She is staying with her mother whole she recovers, so her home number has been temporarily disconnected. [phone]) I have replaced the home number in her demographics with her mother's home phone for the time being. [phone])   A consult has been scheduled with Dr. Mickeal Skinner for Monday 6/14 @ 9:00. Hopefully the path will be back by then for discussion/review. Ms. Cuttino is aware of this appointment and was thankful for the call.   Mont Dutton R.T.(R)(T)  Radiation Special Procedures Navigator

## 2020-02-21 LAB — SURGICAL PATHOLOGY

## 2020-02-22 ENCOUNTER — Ambulatory Visit: Payer: BC Managed Care – PPO | Admitting: Internal Medicine

## 2020-02-25 ENCOUNTER — Other Ambulatory Visit: Payer: Self-pay

## 2020-02-25 ENCOUNTER — Inpatient Hospital Stay: Payer: BC Managed Care – PPO | Admitting: Internal Medicine

## 2020-02-25 VITALS — BP 136/82 | HR 79 | Temp 97.2°F | Resp 20 | Ht 68.0 in | Wt 149.5 lb

## 2020-02-25 DIAGNOSIS — R569 Unspecified convulsions: Secondary | ICD-10-CM | POA: Diagnosis not present

## 2020-02-25 DIAGNOSIS — Z79899 Other long term (current) drug therapy: Secondary | ICD-10-CM

## 2020-02-25 DIAGNOSIS — C719 Malignant neoplasm of brain, unspecified: Secondary | ICD-10-CM

## 2020-02-25 DIAGNOSIS — Z7952 Long term (current) use of systemic steroids: Secondary | ICD-10-CM

## 2020-02-25 DIAGNOSIS — Z7189 Other specified counseling: Secondary | ICD-10-CM

## 2020-02-25 NOTE — Progress Notes (Signed)
[facility] [place], [place] [address] [phone]   New Patient Evaluation  Date of Service: 02/25/20 Patient Name: Alicia Mcdonald Patient MRN: [id number] Patient DOB: 20-Jan-1956 Provider: Ventura Sellers, MD  Identifying Statement:  Alicia Mcdonald is a 64 y.o. female with bifrontal, callosal high grade glioma who presents for initial consultation and evaluation.    Referring Provider: Redmond School, MD [address] [place],  [place] [address]  Oncologic History: Oncology History  High grade glioma not classifiable by WHO criteria [facility])  02/06/2020 Surgery   Stereotactic biopsy by Dr. Marcello Moores     Biomarkers:  MGMT Unknown.  IDH 1/2 Wild type.  EGFR Unknown  TERT Unknown   History of Present Illness: The patient's records from the referring physician were obtained and reviewed and the patient interviewed to confirm this HPI.  Alicia Mcdonald presented to medical attention in May 2021 with sudden onset confusion and disorientation.  She was found by her mother in this state after being seen normal shortly prior.  Apparently had a clinical seizure on the way to ED or in the ED.  CNS imaging demonstrated enhancing mass within corpus callosum and fornix, extending into frontal lobes bilaterally.  She underwent biopsy and ventriculostomy placement on 5/26 with Dr. Marcello Moores; path was sent out to Dr. Maisie Fus at [facility] and demonstrated ungradable IDH-1 wild type glioma.  By several days post-op she had returned to baseline funtional status, fully intact without complaint.  Today she denies any neurologic deficits aside from some memory impairment.    Medications: Current Outpatient Medications on File Prior to Visit  Medication Sig Dispense Refill  . blood glucose meter kit and supplies KIT Check blood sugars three times a day before meals and use insulin as instructed 1 each 0  . cholecalciferol (VITAMIN D) 1000 UNITS tablet  Take 1,000 Units by mouth daily.    . insulin aspart (NOVOLOG) 100 UNIT/ML FlexPen Inject 1-9 Units into the skin 3 (three) times daily with meals. CBG 70 - 120: 0 units ,CBG 121 - 150: 0 unit ,CBG 151 - 200: 2 units ,CBG 201 - 250: 3 units, CBG 251 - 300: 5 units, CBG 301 - 350: 7 units, CBG 351 - 400: 9 units CBG > 400: call your doctor. 3 mL 11  . Insulin Pen Needle 31G X 5 MM MISC 1 each by Does not apply route 3 (three) times daily before meals. 100 each 0  . levETIRAcetam (KEPPRA) 500 MG tablet Take 1 tablet (500 mg total) by mouth 2 (two) times daily. 60 tablet 2  . pantoprazole (PROTONIX) 40 MG tablet Take 1 tablet (40 mg total) by mouth at bedtime. 30 tablet 0  . dexamethasone (DECADRON) 4 MG tablet 1 tablet TID for 3 days 1 tablet BID for 3 days 1 tablet once daily for 3 days (Patient not taking: Reported on 02/25/2020) 18 tablet 0   No current facility-administered medications on file prior to visit.    Allergies:  Allergies  Allergen Reactions  . Tylenol [Acetaminophen] Other (See Comments)    Blood in urine with excessive use   Past Medical History:  Past Medical History:  Diagnosis Date  . Cancer ([place])   . Iron deficiency   . Migraine   . Vitamin D deficiency    Past Surgical History:  Past Surgical History:  Procedure Laterality Date  . APPLICATION OF CRANIAL NAVIGATION N/A 02/06/2020   Procedure: APPLICATION  OF CRANIAL NAVIGATION;  Surgeon: Vallarie Mare, MD;  Location: [facility];  Service: Neurosurgery;  Laterality: N/A;  . COLONOSCOPY N/A 04/24/2013   Procedure: COLONOSCOPY;  Surgeon: Danie Binder, MD;  [facility];  Service: Endoscopy;  Laterality: N/A;  1:45  . FRAMELESS  BIOPSY WITH BRAINLAB N/A 02/06/2020   Procedure: FRAMELESS BIOPSY WITH Lucky Rathke;  Surgeon: Vallarie Mare, MD;  Location: [facility];  Service: Neurosurgery;  Laterality: N/A;  . [place]  . VENTRICULOSTOMY N/A 02/06/2020   Procedure: VENTRICULOSTOMY AND ENDOSCOPIC  SEPTOSTOMY;  Surgeon: Vallarie Mare, MD;  Location: [facility];  Service: Neurosurgery;  Laterality: N/A;   Social History:  Social History   Socioeconomic History  . Marital status: Single    Spouse name: Not on file  . Number of children: Not on file  . Years of education: Not on file  . Highest education level: Not on file  Occupational History  . 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 Smoking status: Never Smoker  . Smokeless tobacco: Never Used  Substance and Sexual Activity  . Alcohol use: Yes    Comment: once a month, rare  . Drug use: No  . Sexual activity: Not on file  Other Topics Concern  . Not on file  Social History Narrative  . Not on file   Social Determinants of Health   Financial Resource Strain:   . Difficulty of Paying Living Expenses:   Food Insecurity:   . Worried About Charity fundraiser in the Last Year:   . Arboriculturist in the Last Year:   Transportation Needs:   . Film/video editor (Medical):   Marland Kitchen Lack of Transportation (Non-Medical):   Physical Activity:   . Days of Exercise per Week:   . Minutes of Exercise per Session:   Stress:   . Feeling of Stress :   Social Connections:   . Frequency of Communication with Friends and Family:   . Frequency of Social Gatherings with Friends and Family:   . Attends Religious Services:   . Active Member of Clubs or Organizations:   . Attends Archivist Meetings:   Marland Kitchen Marital Status:   Intimate Partner Violence:   . Fear of Current or Ex-Partner:   . Emotionally Abused:   Marland Kitchen Physically Abused:   . Sexually Abused:    Family History:  Family History  Problem Relation Age of Onset  . Colon cancer Neg Hx     Review of Systems: Constitutional: Doesn't report fevers, chills or abnormal weight loss Eyes: Doesn't report blurriness of vision Ears, nose, mouth, throat, and face: Doesn't report  sore throat Respiratory: Doesn't report cough, dyspnea or wheezes Cardiovascular: Doesn't report palpitation, chest discomfort  Gastrointestinal:  Doesn't report nausea, constipation, diarrhea GU: Doesn't report incontinence Skin: Doesn't report skin rashes Neurological: Per HPI Musculoskeletal: Doesn't report joint pain Behavioral/Psych: Doesn't report anxiety  Physical Exam: Vitals:   02/25/20 0913  BP: 136/82  Pulse: 79  Resp: 20  Temp: (!) 97.2 F (36.2 C)  SpO2: 100%   KPS: 90. General: Alert, cooperative, pleasant, in no acute distress Head: Normal EENT: No conjunctival injection or scleral icterus.  Lungs: Resp effort normal Cardiac: Regular rate Abdomen: Non-distended abdomen Skin: No rashes cyanosis or petechiae. Extremities: No clubbing or edema  Neurologic Exam: Mental Status: Awake, alert, attentive to examiner.  Oriented to self and environment. Language is fluent with intact comprehension.  Cranial Nerves: Visual acuity is grossly normal. Visual fields are full. Extra-ocular movements intact. No ptosis. Face is symmetric Motor: Tone and bulk are normal. Power is full in both arms and legs. Reflexes are symmetric, no pathologic reflexes present.  Sensory: Intact to light touch Gait: Normal.   Labs: I have reviewed the data as listed    Component Value Date/Time   NA 142 02/09/2020 0631   K 3.2 (L) 02/09/2020 0631   CL 102 02/09/2020 0631   CO2 28 02/09/2020 0631   GLUCOSE 100 (H) 02/09/2020 0631   BUN 17 02/09/2020 0631   CREATININE 0.70 02/09/2020 0631   CALCIUM 9.0 02/09/2020 0631   PROT 6.0 (L) 02/04/2020 1046   ALBUMIN 3.3 (L) 02/04/2020 1046   AST 21 02/04/2020 1046   ALT 23 02/04/2020 1046   ALKPHOS 57 02/04/2020 1046   BILITOT 0.8 02/04/2020 1046   GFRNONAA >60 02/09/2020 0631   GFRAA >60 02/09/2020 0631   Lab Results  Component Value Date   WBC 12.1 (H) 02/07/2020   NEUTROABS 9.6 (H) 02/06/2020   HGB 12.5 02/07/2020   HCT 37.4  02/07/2020   MCV 89.0 02/07/2020   PLT 242 02/07/2020    Imaging:  CT Code Stroke CTA Head W/WO contrast  Result Date: 02/04/2020 CLINICAL DATA:  Follow-up examination for acute encephalopathy. EXAM: CT ANGIOGRAPHY HEAD AND NECK CT PERFUSION BRAIN TECHNIQUE: Multidetector CT imaging of the head and neck was performed using the standard protocol during bolus administration of intravenous contrast. Multiplanar CT image reconstructions and MIPs were obtained to evaluate the vascular anatomy. Carotid stenosis measurements (when applicable) are obtained utilizing NASCET criteria, using the distal internal carotid diameter as the denominator. Multiphase CT imaging of the brain was performed following IV bolus contrast injection. Subsequent parametric perfusion maps were calculated using RAPID software. CONTRAST:  162m OMNIPAQUE IOHEXOL 350 MG/ML SOLN COMPARISON:  Prior head CT from 02/03/2020 as well as brain MRI from earlier the same day. FINDINGS: CTA NECK FINDINGS Aortic arch: Visualized aortic arch of normal caliber with normal branch pattern. Minimal plaque within the arch itself. No hemodynamically significant stenosis about the origin of the great vessels. Visualized subclavian arteries widely patent. Right carotid system: Right common carotid artery widely patent from its origin to the bifurcation without stenosis. Minimal centric calcified plaque at the right bifurcation without significant narrowing. Right ICA widely patent distally to the skull base without stenosis, dissection or occlusion. Left carotid system: Left common and internal carotid arteries widely patent without stenosis, dissection or occlusion. Vertebral arteries: Both vertebral arteries arise from the subclavian arteries. Vertebral arteries widely patent without stenosis, dissection or occlusion. Skeleton: No acute osseous abnormality. No discrete or worrisome osseous lesions. Moderate cervical spondylosis at C5-6 without high-grade  stenosis. Focal sclerosis within the T4 vertebral body noted, nonspecific, but most likely benign. Other neck: No other acute soft tissue abnormality within the neck. No mass lesion or adenopathy. Upper chest: Scattered atelectatic changes noted within the visualized lungs. Visualized upper chest demonstrates no other acute finding. Review of the MIP images confirms the above findings CTA HEAD FINDINGS Anterior circulation: Internal carotid arteries patent to the termini without stenosis or other abnormality. A1 segments patent. Normal anterior communicator complex. Anterior cerebral arteries patent to their distal aspects without stenosis. No M1 stenosis or occlusion. Normal MCA bifurcations. Distal MCA branches well perfused and symmetric. Posterior circulation: Both vertebral arteries widely patent to the vertebrobasilar  junction. Both picas patent. Basilar widely patent to its distal aspect without stenosis. Superior cerebellar and posterior cerebral arteries widely patent bilaterally. Venous sinuses: Grossly patent allowing for timing the contrast bolus. Anatomic variants: None significant. Review of the MIP images confirms the above findings CT Brain Perfusion Findings: ASPECTS: Not applicable. CBF (<30%) Volume: 74m Perfusion (Tmax>6.0s) volume: 038mMismatch Volume: 106m24mnfarction Location:Negative CT perfusion. IMPRESSION: 1. Normal CTA of the head and neck. No large vessel occlusion, hemodynamically significant stenosis, or other acute vascular abnormality. 2. Negative CT perfusion. Electronically Signed   By: BenJeannine BogaD.   On: 02/04/2020 03:22   CT HEAD WO CONTRAST  Result Date: 02/09/2020 CLINICAL DATA:  Post brain biopsy, shunted hydrocephalus EXAM: CT HEAD WITHOUT CONTRAST TECHNIQUE: Contiguous axial images were obtained from the base of the skull through the vertex without intravenous contrast. COMPARISON:  02/07/2020 FINDINGS: Brain: Postoperative changes are again identified with  edema and trace blood products along the right frontal biopsy tract extending to the anterior corpus callosum mass. There is decreased extra-axial air. Decreased layering hemorrhage within the occipital horns. Right parietal approach ventriculostomy catheter is unchanged in position with tip crossing midline into the body of the left lateral ventricle. Stable ventricle caliber. Lateral ventricles remain mildly prominent. No new loss of gray-white differentiation. Vascular: No new finding. Skull: Calvarium is unremarkable. Sinuses/Orbits: No acute finding. Other: None. IMPRESSION: Evolving post biopsy changes.  No new hemorrhage. Stable position of right parietal approach ventriculostomy catheter. Stable ventricle caliber. Electronically Signed   By: PraMacy MisD.   On: 02/09/2020 08:53   CT HEAD WO CONTRAST  Result Date: 02/07/2020 CLINICAL DATA:  Postop brain biopsy EXAM: CT HEAD WITHOUT CONTRAST TECHNIQUE: Contiguous axial images were obtained from the base of the skull through the vertex without intravenous contrast. COMPARISON:  CT head 02/05/2020.  MRI head 02/04/2020 FINDINGS: Brain: Hypodense mass lesion in the anterior genu of the corpus callosum has been biopsied. Small amount of hemorrhage is seen near the biopsy site. There is a needle tract through the right frontal bone. There is a small amount of blood in the occipital horns bilaterally. There has been interval placement of a right parietal shunt catheter crossing the midline into the left lateral ventricle. Ventricles remain mildly dilated and unchanged. The lateral and fourth ventricles are mildly dilated however the third ventricle is not significantly dilated. Negative for acute infarct. No midline shift. Mild amount of subdural gas is present in the right frontal region. Small amount of gas in the right frontal horn. Vascular: Negative for hyperdense vessel Skull: Right parietal burr hole for shunt placement. Right frontal bone needle  tract for biopsy. Sinuses/Orbits: Mild mucosal edema paranasal sinuses. Negative orbit. Other: None IMPRESSION: 1. Interval biopsy of hypodense mass in the anterior genu corpus callosum. Small amount of hemorrhage at the biopsy site. Small amount of hemorrhage in the lateral ventricles bilaterally. 2. Mild dilatation of the lateral and fourth ventricles unchanged from pre biopsy study. Third ventricle nondilated. Interval placement of right parietal shunt catheter crossing the midline into the left lateral ventricle. Electronically Signed   By: ChaFranchot GalloD.   On: 02/07/2020 08:28   CT HEAD WO CONTRAST  Result Date: 02/05/2020 CLINICAL DATA:  Brain mass EXAM: CT HEAD WITHOUT CONTRAST TECHNIQUE: Contiguous axial images were obtained from the base of the skull through the vertex without intravenous contrast. COMPARISON:  02/03/2020 FINDINGS: Brain: Mixed density but primarily hypoattenuating lesion involving the corpus callosum, septum pellucidum/fornices, and  adjacent periventricular white matter is more completely evaluated on recent MR imaging. Stable enlargement of the lateral ventricle secondary to mass effect on the foramen of Monro. Vascular: No new findings. Skull: No new findings. Sinuses/Orbits: No new findings. Other: None. IMPRESSION: Stable interhemispheric mass more completely evaluated on prior MRI with corresponding hyperperfusion. Primary differential consideration remains high-grade glioma. Electronically Signed   By: Macy Mis M.D.   On: 02/05/2020 14:23   CT Code Stroke CTA Neck W/WO contrast  Result Date: 02/04/2020 CLINICAL DATA:  Follow-up examination for acute encephalopathy. EXAM: CT ANGIOGRAPHY HEAD AND NECK CT PERFUSION BRAIN TECHNIQUE: Multidetector CT imaging of the head and neck was performed using the standard protocol during bolus administration of intravenous contrast. Multiplanar CT image reconstructions and MIPs were obtained to evaluate the vascular anatomy.  Carotid stenosis measurements (when applicable) are obtained utilizing NASCET criteria, using the distal internal carotid diameter as the denominator. Multiphase CT imaging of the brain was performed following IV bolus contrast injection. Subsequent parametric perfusion maps were calculated using RAPID software. CONTRAST:  179m OMNIPAQUE IOHEXOL 350 MG/ML SOLN COMPARISON:  Prior head CT from 02/03/2020 as well as brain MRI from earlier the same day. FINDINGS: CTA NECK FINDINGS Aortic arch: Visualized aortic arch of normal caliber with normal branch pattern. Minimal plaque within the arch itself. No hemodynamically significant stenosis about the origin of the great vessels. Visualized subclavian arteries widely patent. Right carotid system: Right common carotid artery widely patent from its origin to the bifurcation without stenosis. Minimal centric calcified plaque at the right bifurcation without significant narrowing. Right ICA widely patent distally to the skull base without stenosis, dissection or occlusion. Left carotid system: Left common and internal carotid arteries widely patent without stenosis, dissection or occlusion. Vertebral arteries: Both vertebral arteries arise from the subclavian arteries. Vertebral arteries widely patent without stenosis, dissection or occlusion. Skeleton: No acute osseous abnormality. No discrete or worrisome osseous lesions. Moderate cervical spondylosis at C5-6 without high-grade stenosis. Focal sclerosis within the T4 vertebral body noted, nonspecific, but most likely benign. Other neck: No other acute soft tissue abnormality within the neck. No mass lesion or adenopathy. Upper chest: Scattered atelectatic changes noted within the visualized lungs. Visualized upper chest demonstrates no other acute finding. Review of the MIP images confirms the above findings CTA HEAD FINDINGS Anterior circulation: Internal carotid arteries patent to the termini without stenosis or other  abnormality. A1 segments patent. Normal anterior communicator complex. Anterior cerebral arteries patent to their distal aspects without stenosis. No M1 stenosis or occlusion. Normal MCA bifurcations. Distal MCA branches well perfused and symmetric. Posterior circulation: Both vertebral arteries widely patent to the vertebrobasilar junction. Both picas patent. Basilar widely patent to its distal aspect without stenosis. Superior cerebellar and posterior cerebral arteries widely patent bilaterally. Venous sinuses: Grossly patent allowing for timing the contrast bolus. Anatomic variants: None significant. Review of the MIP images confirms the above findings CT Brain Perfusion Findings: ASPECTS: Not applicable. CBF (<30%) Volume: 022mPerfusion (Tmax>6.0s) volume: 33m95mismatch Volume: 33mL77mfarction Location:Negative CT perfusion. IMPRESSION: 1. Normal CTA of the head and neck. No large vessel occlusion, hemodynamically significant stenosis, or other acute vascular abnormality. 2. Negative CT perfusion. Electronically Signed   By: BenjJeannine Boga.   On: 02/04/2020 03:22   CT CHEST W CONTRAST  Result Date: 02/05/2020 CLINICAL DATA:  64 y71r old female with history of brain cancer. Staging examination. EXAM: CT CHEST, ABDOMEN, AND PELVIS WITH CONTRAST TECHNIQUE: Multidetector CT imaging of the chest, abdomen and  pelvis was performed following the standard protocol during bolus administration of intravenous contrast. CONTRAST:  141m OMNIPAQUE IOHEXOL 300 MG/ML  SOLN COMPARISON:  No priors. FINDINGS: CT CHEST FINDINGS Cardiovascular: Heart size is normal. Small amount of pericardial fluid in a superior pericardial recess. No other significant pericardial thickening or calcification. Aortic atherosclerosis. No definite calcified atherosclerotic plaque identified in the coronary arteries. Mediastinum/Nodes: No pathologically enlarged mediastinal or hilar lymph nodes. Esophagus is unremarkable in appearance. No  axillary lymphadenopathy. Lungs/Pleura: 3 mm subpleural nodule in the periphery of the right upper lobe (axial image 31 of series 5). No other larger more suspicious appearing pulmonary nodules or masses are noted. No acute consolidative airspace disease. Small amount of dependent atelectasis and/or scarring in the right lower lobe posteriorly. Trace left pleural effusion. No right pleural effusion. Musculoskeletal: Multiple old healed right-sided rib fractures. There are no aggressive appearing lytic or blastic lesions noted in the visualized portions of the skeleton. CT ABDOMEN PELVIS FINDINGS Hepatobiliary: No suspicious cystic or solid hepatic lesions. No intra or extrahepatic biliary ductal dilatation. High attenuation material filling the lumen of the gallbladder, likely related to vicarious excretion of contrast material from yesterday's contrast enhanced CT examinations. No surrounding inflammatory changes. Gallbladder is not distended. Pancreas: No pancreatic mass. No pancreatic ductal dilatation. No pancreatic or peripancreatic fluid collections or inflammatory changes. Spleen: Unremarkable. Adrenals/Urinary Tract: Bilateral kidneys and adrenal glands are normal in appearance. No hydroureteronephrosis. Urinary bladder is filled with iodinated contrast material, and along the posterolateral aspect of the urinary bladder wall on the right side there are several small filling defects abutting the wall measuring up to 6 mm (sagittal image 56 of series 7). Stomach/Bowel: The appearance of the stomach is normal. No pathologic dilatation of small bowel or colon. Normal appendix. Vascular/Lymphatic: Aortic atherosclerosis, without evidence of aneurysm or dissection in the abdominal or pelvic vasculature. No lymphadenopathy noted in the abdomen or pelvis. Reproductive: Uterus and ovaries are unremarkable in appearance. Other: No significant volume of ascites.  No pneumoperitoneum. Musculoskeletal: There are no  aggressive appearing lytic or blastic lesions noted in the visualized portions of the skeleton. IMPRESSION: 1. No definitive evidence to suggest metastatic disease to the chest, abdomen or pelvis. 2. Small filling defects in the posterolateral aspect of the urinary bladder on the right side. This could represent some debris, however, these are intimately associated with the bladder wall such that small urothelial lesions are difficult to entirely exclude. Referral to Urology for further evaluation is recommended in the near future. 3. Small 3 mm subpleural nodule in the periphery of the right upper lobe is nonspecific, but statistically likely a benign subpleural lymph node. No follow-up needed if patient is low-risk. Non-contrast chest CT can be considered in 12 months if patient is high-risk. This recommendation follows the consensus statement: Guidelines for Management of Incidental Pulmonary Nodules Detected on CT Images: From the Fleischner Society 2017; Radiology 2017; 284:228-243. 4. Trace left pleural effusion. 5. Aortic atherosclerosis. 6. Additional incidental findings, as above. Electronically Signed   By: DVinnie LangtonM.D.   On: 02/05/2020 08:15   MR BRAIN W WO CONTRAST  Result Date: 02/04/2020 CLINICAL DATA:  Initial evaluation for acute encephalopathy, migraine, mass on prior head CT. EXAM: MRI HEAD WITHOUT AND WITH CONTRAST TECHNIQUE: Multiplanar, multiecho pulse sequences of the brain and surrounding structures were obtained without and with intravenous contrast. CONTRAST:  7.570mGADAVIST GADOBUTROL 1 MMOL/ML IV SOLN COMPARISON:  Prior head CT from 02/03/2020. FINDINGS: Brain: There is an infiltrative  mass centered at the anterior inter hemispheric fissure, with extension along the fornices, septum pellucidum, and anterior genu of the corpus callosum. Lesion demonstrates heterogeneous T2/FLAIR signal intensity with fairly avid heterogeneous postcontrast enhancement. Exact measurements of this  lesion difficult given location and infiltrative nature. Mild central restricted diffusion related to necrosis and/or hypercellularity. Extension into the left greater than right periventricular white matter. The adjacent frontal horns are somewhat compressed and irregular by the adjacent tumor. Mass effect on the adjacent foramen of Monro with associated lateral ventriculomegaly. No transependymal flow of CSF or herniation. No distant subarachnoid seeding. Trace right-to-left shift at the septum pellucidum. No other mass lesion or abnormal enhancement. No evidence for acute or subacute infarct. Gray-white matter differentiation maintained. No encephalomalacia to suggest chronic cortical infarction. No other evidence for acute or chronic intracranial hemorrhage. No made of a partially empty sella. Note made of a 1.8 cm arachnoid cyst at the left middle cranial fossa. Vascular: Major intracranial vascular flow voids are maintained. Skull and upper cervical spine: Craniocervical junction within normal limits. Upper cervical spine normal. Bone marrow signal intensity within normal limits. No focal marrow replacing lesion. No scalp soft tissue abnormality. Sinuses/Orbits: Globes and orbital soft tissues within normal limits are otherwise clear. No significant mastoid effusion. Inner ear structures grossly normal. Noted within the ethmoidal air cells. Paranasal sinuses Other: None. IMPRESSION: 1. Interhemispheric mass centered at the genu of the corpus callosum, septum pellucidum, and fornices, with extension into the left greater than right periventricular white matter. 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 Lymphoma would be the primary differential consideration. 2. Secondary mass effect on the foramen of Monro with lateral ventriculomegaly. No transependymal flow of CSF, significant midline shift, or herniation. Electronically Signed   By: Jeannine Boga M.D.   On: 02/04/2020 02:37   CT ABDOMEN  PELVIS W CONTRAST  Result Date: 02/05/2020 CLINICAL DATA:  64 year old female with history of brain cancer. Staging examination. EXAM: CT CHEST, ABDOMEN, AND PELVIS WITH CONTRAST TECHNIQUE: Multidetector CT imaging of the chest, abdomen and pelvis was performed following the standard protocol during bolus administration of intravenous contrast. CONTRAST:  113m OMNIPAQUE IOHEXOL 300 MG/ML  SOLN COMPARISON:  No priors. FINDINGS: CT CHEST FINDINGS Cardiovascular: Heart size is normal. Small amount of pericardial fluid in a superior pericardial recess. No other significant pericardial thickening or calcification. Aortic atherosclerosis. No definite calcified atherosclerotic plaque identified in the coronary arteries. Mediastinum/Nodes: No pathologically enlarged mediastinal or hilar lymph nodes. Esophagus is unremarkable in appearance. No axillary lymphadenopathy. Lungs/Pleura: 3 mm subpleural nodule in the periphery of the right upper lobe (axial image 31 of series 5). No other larger more suspicious appearing pulmonary nodules or masses are noted. No acute consolidative airspace disease. Small amount of dependent atelectasis and/or scarring in the right lower lobe posteriorly. Trace left pleural effusion. No right pleural effusion. Musculoskeletal: Multiple old healed right-sided rib fractures. There are no aggressive appearing lytic or blastic lesions noted in the visualized portions of the skeleton. CT ABDOMEN PELVIS FINDINGS Hepatobiliary: No suspicious cystic or solid hepatic lesions. No intra or extrahepatic biliary ductal dilatation. High attenuation material filling the lumen of the gallbladder, likely related to vicarious excretion of contrast material from yesterday's contrast enhanced CT examinations. No surrounding inflammatory changes. Gallbladder is not distended. Pancreas: No pancreatic mass. No pancreatic ductal dilatation. No pancreatic or peripancreatic fluid collections or inflammatory changes.  Spleen: Unremarkable. Adrenals/Urinary Tract: Bilateral kidneys and adrenal glands are normal in appearance. No hydroureteronephrosis. Urinary bladder is filled  with iodinated contrast material, and along the posterolateral aspect of the urinary bladder wall on the right side there are several small filling defects abutting the wall measuring up to 6 mm (sagittal image 56 of series 7). Stomach/Bowel: The appearance of the stomach is normal. No pathologic dilatation of small bowel or colon. Normal appendix. Vascular/Lymphatic: Aortic atherosclerosis, without evidence of aneurysm or dissection in the abdominal or pelvic vasculature. No lymphadenopathy noted in the abdomen or pelvis. Reproductive: Uterus and ovaries are unremarkable in appearance. Other: No significant volume of ascites.  No pneumoperitoneum. Musculoskeletal: There are no aggressive appearing lytic or blastic lesions noted in the visualized portions of the skeleton. IMPRESSION: 1. No definitive evidence to suggest metastatic disease to the chest, abdomen or pelvis. 2. Small filling defects in the posterolateral aspect of the urinary bladder on the right side. This could represent some debris, however, these are intimately associated with the bladder wall such that small urothelial lesions are difficult to entirely exclude. Referral to Urology for further evaluation is recommended in the near future. 3. Small 3 mm subpleural nodule in the periphery of the right upper lobe is nonspecific, but statistically likely a benign subpleural lymph node. No follow-up needed if patient is low-risk. Non-contrast chest CT can be considered in 12 months if patient is high-risk. This recommendation follows the consensus statement: Guidelines for Management of Incidental Pulmonary Nodules Detected on CT Images: From the Fleischner Society 2017; Radiology 2017; 284:228-243. 4. Trace left pleural effusion. 5. Aortic atherosclerosis. 6. Additional incidental findings, as  above. Electronically Signed   By: Vinnie Langton M.D.   On: 02/05/2020 08:15   CT Code Stroke Cerebral Perfusion with contrast  Result Date: 02/04/2020 CLINICAL DATA:  Follow-up examination for acute encephalopathy. EXAM: CT ANGIOGRAPHY HEAD AND NECK CT PERFUSION BRAIN TECHNIQUE: Multidetector CT imaging of the head and neck was performed using the standard protocol during bolus administration of intravenous contrast. Multiplanar CT image reconstructions and MIPs were obtained to evaluate the vascular anatomy. Carotid stenosis measurements (when applicable) are obtained utilizing NASCET criteria, using the distal internal carotid diameter as the denominator. Multiphase CT imaging of the brain was performed following IV bolus contrast injection. Subsequent parametric perfusion maps were calculated using RAPID software. CONTRAST:  168m OMNIPAQUE IOHEXOL 350 MG/ML SOLN COMPARISON:  Prior head CT from 02/03/2020 as well as brain MRI from earlier the same day. FINDINGS: CTA NECK FINDINGS Aortic arch: Visualized aortic arch of normal caliber with normal branch pattern. Minimal plaque within the arch itself. No hemodynamically significant stenosis about the origin of the great vessels. Visualized subclavian arteries widely patent. Right carotid system: Right common carotid artery widely patent from its origin to the bifurcation without stenosis. Minimal centric calcified plaque at the right bifurcation without significant narrowing. Right ICA widely patent distally to the skull base without stenosis, dissection or occlusion. Left carotid system: Left common and internal carotid arteries widely patent without stenosis, dissection or occlusion. Vertebral arteries: Both vertebral arteries arise from the subclavian arteries. Vertebral arteries widely patent without stenosis, dissection or occlusion. Skeleton: No acute osseous abnormality. No discrete or worrisome osseous lesions. Moderate cervical spondylosis at C5-6  without high-grade stenosis. Focal sclerosis within the T4 vertebral body noted, nonspecific, but most likely benign. Other neck: No other acute soft tissue abnormality within the neck. No mass lesion or adenopathy. Upper chest: Scattered atelectatic changes noted within the visualized lungs. Visualized upper chest demonstrates no other acute finding. Review of the MIP images confirms the above findings  CTA HEAD FINDINGS Anterior circulation: Internal carotid arteries patent to the termini without stenosis or other abnormality. A1 segments patent. Normal anterior communicator complex. Anterior cerebral arteries patent to their distal aspects without stenosis. No M1 stenosis or occlusion. Normal MCA bifurcations. Distal MCA branches well perfused and symmetric. Posterior circulation: Both vertebral arteries widely patent to the vertebrobasilar junction. Both picas patent. Basilar widely patent to its distal aspect without stenosis. Superior cerebellar and posterior cerebral arteries widely patent bilaterally. Venous sinuses: Grossly patent allowing for timing the contrast bolus. Anatomic variants: None significant. Review of the MIP images confirms the above findings CT Brain Perfusion Findings: ASPECTS: Not applicable. CBF (<30%) Volume: 2m Perfusion (Tmax>6.0s) volume: 062mMismatch Volume: 62m54mnfarction Location:Negative CT perfusion. IMPRESSION: 1. Normal CTA of the head and neck. No large vessel occlusion, hemodynamically significant stenosis, or other acute vascular abnormality. 2. Negative CT perfusion. Electronically Signed   By: BenJeannine BogaD.   On: 02/04/2020 03:22   DG Chest Port 1 View  Result Date: 02/03/2020 CLINICAL DATA:  Altered mental status EXAM: PORTABLE CHEST 1 VIEW COMPARISON:  Partial comparison to limited CT chest dated 03/08/2012 FINDINGS: Mild increased interstitial markings. Left lower lobe opacity, likely atelectasis. No pleural effusion or pneumothorax. The heart is normal  in size. Old right 3rd and 4th rib fracture deformities. IMPRESSION: Mild left lower lobe opacity, likely atelectasis. Electronically Signed   By: SriJulian HyD.   On: 02/03/2020 11:41   EEG adult  Result Date: 02/04/2020 YadLora HavensD     02/04/2020  1:21 PM Patient Name: Alicia BALDWINN: [id number] Attending: PriLora Havensferring Physician/Provider: Dr. PraHeath Larkte: 02/04/2020 Duration: 25.33 mins Patient history: 64 23ar old female presented with nausea vomiting headache and encephalopathy.  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  EEG evaluate for seizures. Level of alertness: awake, asleep AEDs during EEG study: Keppra Technical aspects: This EEG study was done with scalp electrodes positioned according to the 10-20 International system of electrode placement. Electrical activity was acquired at a sampling rate of 500Hz  and reviewed with a high frequency filter of 70Hz  and a low frequency filter of 1Hz . EEG data were recorded continuously and digitally stored. Description: The posterior dominant rhythm consists of 9-10 Hz activity of moderate voltage (25-35 uV) seen predominantly in posterior head regions, symmetric and reactive to eye opening and eye closing. Sleep was characterized by vertex waves, sleep spindles (12 to 14 Hz), maximal frontocentral region. EEG showed continuous generalized 5-6 Hz theta as well as intermittent 2-3Hz  delta slowing. Hyperventilation and photic stimulation were not performed.   ABNORMALITY -Continuous slow, generalized IMPRESSION: This study is suggestive of mild to moderate diffuse encephalopathy, nonspecific etiology. No seizures or epileptiform discharges were seen throughout the recording. PriLora HavensCT HEAD CODE STROKE WO CONTRAST  Addendum Date: 02/03/2020   ADDENDUM REPORT: 02/03/2020 10:18 ADDENDUM: This study was inadvertently signed before the report was completed, please  disregard the above. Interhemispheric mass centered at the genu of the corpus callosum, fornices, and septum pellucidum with peripheral isodensity and central low-density. Ill-defined low-density seen in the bifrontal white matter. Findings are primarily concerning for aggressive glioma or lymphoma. There is mass effect on the foramen Monro with mild lateral ventriculomegaly but no herniation or shift. No acute hemorrhage. Case discussed with Dr. ZacRogene Houstonectronically Signed   By: JonMonte FantasiaD.   On: 02/03/2020 10:18   Result Date: 02/03/2020 CLINICAL DATA:  Code stroke.  EXAM: CT HEAD WITHOUT CONTRAST TECHNIQUE: Contiguous axial images were obtained from the base of the skull through the vertex without intravenous contrast. COMPARISON:  None. FINDINGS: Brain: Masslike appearance at the anterior interhemispheric fissure along the fornices, genu of the corpus callosum, and septum pellucidum. The size of peripheral isodense appearance and central low-density. Vascular: Skull: Sinuses/Orbits: Other: These results were called by telephone at the time of interpretation on 02/03/2020 at 10:10 am to provider Fredia Sorrow , who verbally acknowledged these results. ASPECTS [facility] Stroke Program Early CT Score) - Ganglionic level infarction (caudate, lentiform nuclei, internal capsule, insula, M1-M3 cortex): - Supraganglionic infarction (M4-M6 cortex): Total score (0-10 with 10 being normal): IMPRESSION: 1. 2. ASPECTS is Electronically Signed: By: Monte Fantasia M.D. On: 02/03/2020 10:12    Pathology: SURGICAL PATHOLOGY  * THIS IS AN AMENDED REPORT *  CASE: [id number]  PATIENT: Alicia Mcdonald  Surgical Pathology Report   Amendment  Reason for Amendment #1: Outside consultation   Clinical History: brain lesion (cm)    FINAL MICROSCOPIC DIAGNOSIS:   A. 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 This biopsy material will be sent to [facility] for further work-up.  An amended report will follow.   DIAGNOSTIC AMENDMENT:  The following comment was rendered by Dr. Maisie Fus from [facility]:   "The tumor is an infiltrating astrocytoma. Mitoses are rare,  endothelial hyperplasia and necrosis are absent, and the Ki-67 index is  approximately 2%. By immunohistochemistry, IDH 1R132H is negative, ATRX  is weak/indeterminate and p53, K27M, CD20 and BRAFV600E are negative.  The tumor is not easy to classify on histology alone. EGFR FISH shows  gains without amplification. PTEN and TERT molecular tests are in  progress at [facility], and if they identify molecular features of a  high-grade glioma the diagnosis will be updated in an addendum. If  testing is not diagnostic, I recommend sending a tissue block to [facility]  or foundation for advanced molecular testing to assist with  classification."   ATTENTION: This case was originally signed out as pending "Atypical and  hyeprcellular brain tissue; pending outside consultation" and is being  amended to reflect the consulting pathologists diagnosis.  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  Today we extensively discussed her clinical course, imaging, pathology and tumor genetics.  Although her glioma is not gradable histologically, IDH wild type status combined with heterogeneous enhancement on MRI are suggestive of high grade.    We ultimately recommended proceeding with course of full dose (60Gy) intensity modulated radiation therapy and concurrent daily Temozolomide.  Radiation will be administered Mon-Fri over 6 weeks, Temodar will be dosed at 48m/m2 to be given daily over 42 days.  We  reviewed side effects of temodar, including fatigue, nausea/vomiting, constipation, and cytopenias.  Chemotherapy should be held for the following:  ANC less than 1,000  Platelets less than 100,000  LFT or creatinine greater than 2x ULN  If clinical concerns/contraindications develop  Every 2 weeks during radiation, labs will be checked accompanied by a clinical evaluation in the brain tumor clinic.  For seizures, she will continue Keppra 5039mBID.  Fortunately she has completed decadron taper post-operatively.  Screening for potential clinical  trials was performed and discussed using eligibility criteria for active protocols at [facility], loco-regional tertiary centers, as well as national database available on [email or web address].    The patient is not a candidate for a research protocol at this time due to no suitable study identified.   We spent twenty additional minutes teaching regarding the natural history, biology, and historical experience in the treatment of brain tumors. We then discussed in detail the current recommendations for therapy focusing on the mode of administration, mechanism of action, anticipated toxicities, and quality of life issues associated with this plan. We also provided teaching sheets for the patient to take home as an additional resource.  Alicia Mcdonald will return to clinic during weeks 2, 4 and 6 of radiation therapy with labs for review.  All questions were answered. The patient knows to call the clinic with any problems, questions or concerns. No barriers to learning were detected.  The total time spent in the encounter was 60 minutes and more than 50% was on counseling and review of test results   Ventura Sellers, MD Medical Director of Neuro-Oncology [facility] [place] 02/25/20 5:02 PM

## 2020-02-26 ENCOUNTER — Telehealth: Payer: Self-pay | Admitting: Pharmacist

## 2020-02-26 ENCOUNTER — Other Ambulatory Visit: Payer: Self-pay

## 2020-02-26 ENCOUNTER — Ambulatory Visit
Admission: RE | Admit: 2020-02-26 | Discharge: 2020-02-26 | Disposition: A | Discharge status: Home / Self Care | Payer: BC Managed Care – PPO | Source: Ambulatory Visit | Attending: Radiation Oncology | Admitting: Radiation Oncology

## 2020-02-26 ENCOUNTER — Telehealth: Payer: Self-pay

## 2020-02-26 ENCOUNTER — Encounter: Payer: Self-pay | Admitting: Radiation Oncology

## 2020-02-26 VITALS — Ht 67.0 in | Wt 149.0 lb

## 2020-02-26 DIAGNOSIS — C719 Malignant neoplasm of brain, unspecified: Secondary | ICD-10-CM

## 2020-02-26 MED ORDER — ONDANSETRON HCL 8 MG PO TABS: 8.0000 mg | ORAL_TABLET | Freq: Two times a day (BID) | ORAL | 1 refills | Status: DC | PRN

## 2020-02-26 MED ORDER — TEMOZOLOMIDE 20 MG PO CAPS
20.0000 mg | ORAL_CAPSULE | Freq: Every day | ORAL | 0 refills | Status: DC
Start: 2020-02-26 — End: 2020-05-20

## 2020-02-26 MED ORDER — TEMOZOLOMIDE 100 MG PO CAPS: 100.0000 mg | ORAL_CAPSULE | Freq: Every day | ORAL | 0 refills | Status: DC

## 2020-02-26 MED FILL — ONDANSETRON HCL 8 MG TABLET: 8 | 21 days supply | Qty: 18 | Fill #0

## 2020-02-26 NOTE — Telephone Encounter (Signed)
Oral Oncology Patient Advocate Encounter  Prior Authorization for Temozolomide has been approved.    PA# 20mg  [id number]        100mg  [id number] Effective dates: 02/26/20 through 02/25/21  Patients co-pay is $200/each strength  Oral Oncology Clinic will continue to follow.   [place] Patient [place] Phone [phone] Fax [phone] 02/26/2020 2:59 PM

## 2020-02-26 NOTE — Progress Notes (Signed)
START ON PATHWAY REGIMEN - Neuro     One cycle, concurrent with RT:     Temozolomide   **Always confirm dose/schedule in your pharmacy ordering system**  Patient Characteristics: Glioblastoma (Grade IV Glioma), Newly Diagnosed / Treatment Naive, Good Performance Status and/or Younger Patient, MGMT Promoter Unmethylated/Unknown Disease Classification: Glioma Disease Classification: Glioblastoma (Grade IV Glioma) Disease Status: Newly Diagnosed / Treatment Naive Performance Status: Good Performance Status and/or Younger Patient MGMT Promoter Methylation Status: Awaiting Test Results Intent of Therapy: Non-Curative / Palliative Intent, Discussed with Patient 

## 2020-02-26 NOTE — Progress Notes (Signed)
Location/Histology of Brain Tumor: Bifrontal, callosal high grade glioma  Patient presented with symptoms of:  Confusion and disorientation.  She states she was having sinus pressure.  CT CAP 02/05/2020:No definitive evidence to suggest metastatic disease to the chest, abdomen or pelvis.  Small filling defects in the posterolateral aspect of the urinary bladder on the right side. This could represent some debris, however, these are intimately associated with the bladder wall such that small urothelial lesions are difficult to entirely exclude.  Referral to Urology for further evaluation is recommended in the near future.  MRI Brain 02/04/2020: Interhemispheric mass centered at the genu of the corpus callosum, septum pellucidum, and fornices, with extension into the left greater than right periventricular white matter. 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 Lymphoma would be the primary differential consideration.  CT Head 02/03/2020: Interhemispheric mass centered at the genu of the corpus callosum, fornices, and septum pellucidum with peripheral isodensity and central low density.  Ill defined low density seen in the bifrontal white matter.  Findings are primarily concerning for aggressive glioma or lymphoma.  Biology: Brain Mass 02/06/2020   Past or anticipated interventions, if any, per neurosurgery:  Dr. Marcello Moores -Stereotactic biopsy 02/06/2020 -Septostomy and EVD placement  Past or anticipated interventions, if any, per medical oncology:  Dr. Mickeal Skinner 02/25/2020 -Temodar  Dose of Decadron, if applicable: Done  Recent neurologic symptoms, if any:   Seizures: No  Headaches: Mild pressure.  Nausea: No  Dizziness/ataxia: No  Difficulty with hand coordination: No  Focal numbness/weakness: No  Visual deficits/changes: No  Confusion/Memory deficits:  has occasional short term memory loss, she states this occurs when she is tired.   SAFETY ISSUES:  Prior radiation?  No  Pacemaker/ICD? No  Possible current pregnancy? Postmenopausal  Is the patient on methotrexate? No  Additional Complaints / other details:

## 2020-02-26 NOTE — Progress Notes (Signed)
Radiation Oncology         [phone] ________________________________  Initial Outpatient Consultation - Conducted via telephone due to current COVID-19 concerns for limiting patient exposure  I spoke with the patient to conduct this consult visit via telephone to spare the patient unnecessary potential exposure in the healthcare setting during the current COVID-19 pandemic. The patient was notified in advance and was offered a [place] meeting to allow for face to face communication but unfortunately reported that they did not have the appropriate resources/technology to support such a visit and instead preferred to proceed with a telephone consult.   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   HISTORY OF PRESENT ILLNESS: Alicia Mcdonald is a 64 y.o. female seen at the request of Dr. 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  The patient had been in her usual state of health until May 2021 when she noticed sudden onset of disorientation and feeling confused, her mother found her shortly after and apparently there was a seizure that happened clinically on the way to the emergency department.  Imaging of the brain on 02/03/2020 revealed an ill-defined density in the bifrontal white matter, subsequent MRI on 02/04/2020 revealed inter hemispheric mass centered in the genu of the corpus callosum septum pellucidum and fornices with extension into the left greater than right periventricular white matter, secondary mass-effect was noted of the foramen of Monro with lateral ventriculomegaly, she proceeded with biopsy and ventriculostomy placement on 02/06/2020 with Dr. Marcello Moores.  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  Postoperatively she returned to baseline status without complaints and saw Dr. Mickeal Skinner yesterday.  Her postop imaging has been CT-based, her most recent CT head without contrast was on 02/09/2020 that revealed postbiopsy change without evidence of new hemorrhage, stable position of the right parietal ventriculostomy catheter and stable ventricular caliber was identified.  Her case was discussed in multidisciplinary brain oncology conference this week, and she is contacted via telephone to discuss treatment recommendations which include chemoradiation.    PREVIOUS RADIATION THERAPY: No   PAST MEDICAL HISTORY:  Past Medical History:  Diagnosis Date  . Cancer ([place])   . Iron deficiency   . Migraine   . Vitamin D deficiency        PAST SURGICAL HISTORY: Past Surgical History:  Procedure Laterality Date  . APPLICATION OF CRANIAL NAVIGATION N/A 02/06/2020   Procedure: APPLICATION OF CRANIAL NAVIGATION;  Surgeon: Vallarie Mare, MD;  Location: [place];  Service: Neurosurgery;  Laterality: N/A;  . COLONOSCOPY N/A 04/24/2013   Procedure: COLONOSCOPY;  Surgeon: Danie Binder, MD;  [facility];  Service: Endoscopy;  Laterality: N/A;  1:45  . FRAMELESS  BIOPSY WITH BRAINLAB N/A 02/06/2020   Procedure: FRAMELESS BIOPSY WITH Lucky Rathke;  Surgeon: Vallarie Mare, MD;  Location: [place];  Service: Neurosurgery;  Laterality: N/A;  . [place]  . VENTRICULOSTOMY N/A 02/06/2020   Procedure: VENTRICULOSTOMY AND ENDOSCOPIC SEPTOSTOMY;  Surgeon: Vallarie Mare, MD;  Location: [place];  Service: Neurosurgery;  Laterality: N/A;     FAMILY HISTORY:  Family History  Problem Relation  Age of Onset  . Colon cancer Neg Hx      SOCIAL HISTORY:  reports that she has never smoked. She has never used smokeless tobacco. She reports current alcohol use. She reports that she does not use drugs.   ALLERGIES: Tylenol [acetaminophen]   MEDICATIONS:    Current Outpatient Medications  Medication Sig Dispense Refill  . blood glucose meter kit and supplies KIT Check blood sugars three times a day before meals and use insulin as instructed 1 each 0  . cholecalciferol (VITAMIN D) 1000 UNITS tablet Take 1,000 Units by mouth daily.    . insulin aspart (NOVOLOG) 100 UNIT/ML FlexPen Inject 1-9 Units into the skin 3 (three) times daily with meals. CBG 70 - 120: 0 units ,CBG 121 - 150: 0 unit ,CBG 151 - 200: 2 units ,CBG 201 - 250: 3 units, CBG 251 - 300: 5 units, CBG 301 - 350: 7 units, CBG 351 - 400: 9 units CBG > 400: call your doctor. 3 mL 11  . Insulin Pen Needle 31G X 5 MM MISC 1 each by Does not apply route 3 (three) times daily before meals. 100 each 0  . Lancets (ONETOUCH DELICA PLUS [id number]) MISC USE TO TEST BLOOD SUGARAUP TO 4 TIMES DAILY.    Marland Kitchen levETIRAcetam (KEPPRA) 500 MG tablet Take 1 tablet (500 mg total) by mouth 2 (two) times daily. 60 tablet 2  . ONETOUCH ULTRA test strip USE TO TEST BLOOD SUGAR_UP TO 4 TIMES DAILY._    . pantoprazole (PROTONIX) 40 MG tablet Take 1 tablet (40 mg total) by mouth at bedtime. 30 tablet 0  . dexamethasone (DECADRON) 4 MG tablet 1 tablet TID for 3 days 1 tablet BID for 3 days 1 tablet once daily for 3 days (Patient not taking: Reported on 02/25/2020) 18 tablet 0   No current facility-administered medications for this encounter.     REVIEW OF SYSTEMS: On review of systems, the patient reports that he is doing pretty well overall.  She feels as though she is not having any headaches or visual changes, she denies any episodes of confusion or seizure activity.  No other complaints are verbalized.     PHYSICAL EXAM:  Wt Readings from Last 3 Encounters:  02/26/20 149 lb (67.6 kg)  02/25/20 149 lb 8 oz (67.8 kg)  02/06/20 155 lb (70.3 kg)  Unable to assess due to encounter type.  ECOG = 0  0 - Asymptomatic (Fully active, able to carry on all predisease activities without restriction)  1 - Symptomatic  but completely ambulatory (Restricted in physically strenuous activity but ambulatory and able to carry out work of a light or sedentary nature. For example, light housework, office work)  2 - Symptomatic, <50% in bed during the day (Ambulatory and capable of all self care but unable to carry out any work activities. Up and about more than 50% of waking hours)  3 - Symptomatic, >50% in bed, but not bedbound (Capable of only limited self-care, confined to bed or chair 50% or more of waking hours)  4 - Bedbound (Completely disabled. Cannot carry on any self-care. Totally confined to bed or chair)  5 - Death   Eustace Pen MM, Creech RH, Tormey DC, et al. [phone]). "Toxicity and response criteria of the [facility]". [place] Oncol. 5 (6): 649-55    LABORATORY DATA:  Lab Results  Component Value Date   WBC 12.1 (H) 02/07/2020   HGB 12.5 02/07/2020   HCT  37.4 02/07/2020   MCV 89.0 02/07/2020   PLT 242 02/07/2020   Lab Results  Component Value Date   NA 142 02/09/2020   K 3.2 (L) 02/09/2020   CL 102 02/09/2020   CO2 28 02/09/2020   Lab Results  Component Value Date   ALT 23 02/04/2020   AST 21 02/04/2020   ALKPHOS 57 02/04/2020   BILITOT 0.8 02/04/2020      RADIOGRAPHY: CT Code Stroke CTA Head W/WO contrast  Result Date: 02/04/2020 CLINICAL DATA:  Follow-up examination for acute encephalopathy. EXAM: CT ANGIOGRAPHY HEAD AND NECK CT PERFUSION BRAIN TECHNIQUE: Multidetector CT imaging of the head and neck was performed using the standard protocol during bolus administration of intravenous contrast. Multiplanar CT image reconstructions and MIPs were obtained to evaluate the vascular anatomy. Carotid stenosis measurements (when applicable) are obtained utilizing NASCET criteria, using the distal internal carotid diameter as the denominator. Multiphase CT imaging of the brain was performed following IV bolus contrast injection. Subsequent parametric perfusion maps were  calculated using RAPID software. CONTRAST:  179m OMNIPAQUE IOHEXOL 350 MG/ML SOLN COMPARISON:  Prior head CT from 02/03/2020 as well as brain MRI from earlier the same day. FINDINGS: CTA NECK FINDINGS Aortic arch: Visualized aortic arch of normal caliber with normal branch pattern. Minimal plaque within the arch itself. No hemodynamically significant stenosis about the origin of the great vessels. Visualized subclavian arteries widely patent. Right carotid system: Right common carotid artery widely patent from its origin to the bifurcation without stenosis. Minimal centric calcified plaque at the right bifurcation without significant narrowing. Right ICA widely patent distally to the skull base without stenosis, dissection or occlusion. Left carotid system: Left common and internal carotid arteries widely patent without stenosis, dissection or occlusion. Vertebral arteries: Both vertebral arteries arise from the subclavian arteries. Vertebral arteries widely patent without stenosis, dissection or occlusion. Skeleton: No acute osseous abnormality. No discrete or worrisome osseous lesions. Moderate cervical spondylosis at C5-6 without high-grade stenosis. Focal sclerosis within the T4 vertebral body noted, nonspecific, but most likely benign. Other neck: No other acute soft tissue abnormality within the neck. No mass lesion or adenopathy. Upper chest: Scattered atelectatic changes noted within the visualized lungs. Visualized upper chest demonstrates no other acute finding. Review of the MIP images confirms the above findings CTA HEAD FINDINGS Anterior circulation: Internal carotid arteries patent to the termini without stenosis or other abnormality. A1 segments patent. Normal anterior communicator complex. Anterior cerebral arteries patent to their distal aspects without stenosis. No M1 stenosis or occlusion. Normal MCA bifurcations. Distal MCA branches well perfused and symmetric. Posterior circulation: Both  vertebral arteries widely patent to the vertebrobasilar junction. Both picas patent. Basilar widely patent to its distal aspect without stenosis. Superior cerebellar and posterior cerebral arteries widely patent bilaterally. Venous sinuses: Grossly patent allowing for timing the contrast bolus. Anatomic variants: None significant. Review of the MIP images confirms the above findings CT Brain Perfusion Findings: ASPECTS: Not applicable. CBF (<30%) Volume: 099mPerfusion (Tmax>6.0s) volume: 57m52mismatch Volume: 57mL4mfarction Location:Negative CT perfusion. IMPRESSION: 1. Normal CTA of the head and neck. No large vessel occlusion, hemodynamically significant stenosis, or other acute vascular abnormality. 2. Negative CT perfusion. Electronically Signed   By: BenjJeannine Boga.   On: 02/04/2020 03:22   CT HEAD WO CONTRAST  Result Date: 02/09/2020 CLINICAL DATA:  Post brain biopsy, shunted hydrocephalus EXAM: CT HEAD WITHOUT CONTRAST TECHNIQUE: Contiguous axial images were obtained from the base of the skull through the vertex without intravenous  contrast. COMPARISON:  02/07/2020 FINDINGS: Brain: Postoperative changes are again identified with edema and trace blood products along the right frontal biopsy tract extending to the anterior corpus callosum mass. There is decreased extra-axial air. Decreased layering hemorrhage within the occipital horns. Right parietal approach ventriculostomy catheter is unchanged in position with tip crossing midline into the body of the left lateral ventricle. Stable ventricle caliber. Lateral ventricles remain mildly prominent. No new loss of gray-white differentiation. Vascular: No new finding. Skull: Calvarium is unremarkable. Sinuses/Orbits: No acute finding. Other: None. IMPRESSION: Evolving post biopsy changes.  No new hemorrhage. Stable position of right parietal approach ventriculostomy catheter. Stable ventricle caliber. Electronically Signed   By: Macy Mis M.D.    On: 02/09/2020 08:53   CT HEAD WO CONTRAST  Result Date: 02/07/2020 CLINICAL DATA:  Postop brain biopsy EXAM: CT HEAD WITHOUT CONTRAST TECHNIQUE: Contiguous axial images were obtained from the base of the skull through the vertex without intravenous contrast. COMPARISON:  CT head 02/05/2020.  MRI head 02/04/2020 FINDINGS: Brain: Hypodense mass lesion in the anterior genu of the corpus callosum has been biopsied. Small amount of hemorrhage is seen near the biopsy site. There is a needle tract through the right frontal bone. There is a small amount of blood in the occipital horns bilaterally. There has been interval placement of a right parietal shunt catheter crossing the midline into the left lateral ventricle. Ventricles remain mildly dilated and unchanged. The lateral and fourth ventricles are mildly dilated however the third ventricle is not significantly dilated. Negative for acute infarct. No midline shift. Mild amount of subdural gas is present in the right frontal region. Small amount of gas in the right frontal horn. Vascular: Negative for hyperdense vessel Skull: Right parietal burr hole for shunt placement. Right frontal bone needle tract for biopsy. Sinuses/Orbits: Mild mucosal edema paranasal sinuses. Negative orbit. Other: None IMPRESSION: 1. Interval biopsy of hypodense mass in the anterior genu corpus callosum. Small amount of hemorrhage at the biopsy site. Small amount of hemorrhage in the lateral ventricles bilaterally. 2. Mild dilatation of the lateral and fourth ventricles unchanged from pre biopsy study. Third ventricle nondilated. Interval placement of right parietal shunt catheter crossing the midline into the left lateral ventricle. Electronically Signed   By: Franchot Gallo M.D.   On: 02/07/2020 08:28   CT HEAD WO CONTRAST  Result Date: 02/05/2020 CLINICAL DATA:  Brain mass EXAM: CT HEAD WITHOUT CONTRAST TECHNIQUE: Contiguous axial images were obtained from the base of the skull  through the vertex without intravenous contrast. COMPARISON:  02/03/2020 FINDINGS: Brain: Mixed density but primarily hypoattenuating lesion involving the corpus callosum, septum pellucidum/fornices, and adjacent periventricular white matter is more completely evaluated on recent MR imaging. Stable enlargement of the lateral ventricle secondary to mass effect on the foramen of Monro. Vascular: No new findings. Skull: No new findings. Sinuses/Orbits: No new findings. Other: None. IMPRESSION: Stable interhemispheric mass more completely evaluated on prior MRI with corresponding hyperperfusion. Primary differential consideration remains high-grade glioma. Electronically Signed   By: Macy Mis M.D.   On: 02/05/2020 14:23   CT Code Stroke CTA Neck W/WO contrast  Result Date: 02/04/2020 CLINICAL DATA:  Follow-up examination for acute encephalopathy. EXAM: CT ANGIOGRAPHY HEAD AND NECK CT PERFUSION BRAIN TECHNIQUE: Multidetector CT imaging of the head and neck was performed using the standard protocol during bolus administration of intravenous contrast. Multiplanar CT image reconstructions and MIPs were obtained to evaluate the vascular anatomy. Carotid stenosis measurements (when applicable) are obtained utilizing  NASCET criteria, using the distal internal carotid diameter as the denominator. Multiphase CT imaging of the brain was performed following IV bolus contrast injection. Subsequent parametric perfusion maps were calculated using RAPID software. CONTRAST:  129m OMNIPAQUE IOHEXOL 350 MG/ML SOLN COMPARISON:  Prior head CT from 02/03/2020 as well as brain MRI from earlier the same day. FINDINGS: CTA NECK FINDINGS Aortic arch: Visualized aortic arch of normal caliber with normal branch pattern. Minimal plaque within the arch itself. No hemodynamically significant stenosis about the origin of the great vessels. Visualized subclavian arteries widely patent. Right carotid system: Right common carotid artery  widely patent from its origin to the bifurcation without stenosis. Minimal centric calcified plaque at the right bifurcation without significant narrowing. Right ICA widely patent distally to the skull base without stenosis, dissection or occlusion. Left carotid system: Left common and internal carotid arteries widely patent without stenosis, dissection or occlusion. Vertebral arteries: Both vertebral arteries arise from the subclavian arteries. Vertebral arteries widely patent without stenosis, dissection or occlusion. Skeleton: No acute osseous abnormality. No discrete or worrisome osseous lesions. Moderate cervical spondylosis at C5-6 without high-grade stenosis. Focal sclerosis within the T4 vertebral body noted, nonspecific, but most likely benign. Other neck: No other acute soft tissue abnormality within the neck. No mass lesion or adenopathy. Upper chest: Scattered atelectatic changes noted within the visualized lungs. Visualized upper chest demonstrates no other acute finding. Review of the MIP images confirms the above findings CTA HEAD FINDINGS Anterior circulation: Internal carotid arteries patent to the termini without stenosis or other abnormality. A1 segments patent. Normal anterior communicator complex. Anterior cerebral arteries patent to their distal aspects without stenosis. No M1 stenosis or occlusion. Normal MCA bifurcations. Distal MCA branches well perfused and symmetric. Posterior circulation: Both vertebral arteries widely patent to the vertebrobasilar junction. Both picas patent. Basilar widely patent to its distal aspect without stenosis. Superior cerebellar and posterior cerebral arteries widely patent bilaterally. Venous sinuses: Grossly patent allowing for timing the contrast bolus. Anatomic variants: None significant. Review of the MIP images confirms the above findings CT Brain Perfusion Findings: ASPECTS: Not applicable. CBF (<30%) Volume: 063mPerfusion (Tmax>6.0s) volume: 40m15mMismatch Volume: 40mL35mfarction Location:Negative CT perfusion. IMPRESSION: 1. Normal CTA of the head and neck. No large vessel occlusion, hemodynamically significant stenosis, or other acute vascular abnormality. 2. Negative CT perfusion. Electronically Signed   By: BenjJeannine Boga.   On: 02/04/2020 03:22   CT CHEST W CONTRAST  Result Date: 02/05/2020 CLINICAL DATA:  64 y38r old female with history of brain cancer. Staging examination. EXAM: CT CHEST, ABDOMEN, AND PELVIS WITH CONTRAST TECHNIQUE: Multidetector CT imaging of the chest, abdomen and pelvis was performed following the standard protocol during bolus administration of intravenous contrast. CONTRAST:  1040mL83mIPAQUE IOHEXOL 300 MG/ML  SOLN COMPARISON:  No priors. FINDINGS: CT CHEST FINDINGS Cardiovascular: Heart size is normal. Small amount of pericardial fluid in a superior pericardial recess. No other significant pericardial thickening or calcification. Aortic atherosclerosis. No definite calcified atherosclerotic plaque identified in the coronary arteries. Mediastinum/Nodes: No pathologically enlarged mediastinal or hilar lymph nodes. Esophagus is unremarkable in appearance. No axillary lymphadenopathy. Lungs/Pleura: 3 mm subpleural nodule in the periphery of the right upper lobe (axial image 31 of series 5). No other larger more suspicious appearing pulmonary nodules or masses are noted. No acute consolidative airspace disease. Small amount of dependent atelectasis and/or scarring in the right lower lobe posteriorly. Trace left pleural effusion. No right pleural effusion. Musculoskeletal: Multiple old healed right-sided rib fractures. There  are no aggressive appearing lytic or blastic lesions noted in the visualized portions of the skeleton. CT ABDOMEN PELVIS FINDINGS Hepatobiliary: No suspicious cystic or solid hepatic lesions. No intra or extrahepatic biliary ductal dilatation. High attenuation material filling the lumen of the  gallbladder, likely related to vicarious excretion of contrast material from yesterday's contrast enhanced CT examinations. No surrounding inflammatory changes. Gallbladder is not distended. Pancreas: No pancreatic mass. No pancreatic ductal dilatation. No pancreatic or peripancreatic fluid collections or inflammatory changes. Spleen: Unremarkable. Adrenals/Urinary Tract: Bilateral kidneys and adrenal glands are normal in appearance. No hydroureteronephrosis. Urinary bladder is filled with iodinated contrast material, and along the posterolateral aspect of the urinary bladder wall on the right side there are several small filling defects abutting the wall measuring up to 6 mm (sagittal image 56 of series 7). Stomach/Bowel: The appearance of the stomach is normal. No pathologic dilatation of small bowel or colon. Normal appendix. Vascular/Lymphatic: Aortic atherosclerosis, without evidence of aneurysm or dissection in the abdominal or pelvic vasculature. No lymphadenopathy noted in the abdomen or pelvis. Reproductive: Uterus and ovaries are unremarkable in appearance. Other: No significant volume of ascites.  No pneumoperitoneum. Musculoskeletal: There are no aggressive appearing lytic or blastic lesions noted in the visualized portions of the skeleton. IMPRESSION: 1. No definitive evidence to suggest metastatic disease to the chest, abdomen or pelvis. 2. Small filling defects in the posterolateral aspect of the urinary bladder on the right side. This could represent some debris, however, these are intimately associated with the bladder wall such that small urothelial lesions are difficult to entirely exclude. Referral to Urology for further evaluation is recommended in the near future. 3. Small 3 mm subpleural nodule in the periphery of the right upper lobe is nonspecific, but statistically likely a benign subpleural lymph node. No follow-up needed if patient is low-risk. Non-contrast chest CT can be considered in  12 months if patient is high-risk. This recommendation follows the consensus statement: Guidelines for Management of Incidental Pulmonary Nodules Detected on CT Images: From the Fleischner Society 2017; Radiology 2017; 284:228-243. 4. Trace left pleural effusion. 5. Aortic atherosclerosis. 6. Additional incidental findings, as above. Electronically Signed   By: Vinnie Langton M.D.   On: 02/05/2020 08:15   MR BRAIN W WO CONTRAST  Result Date: 02/04/2020 CLINICAL DATA:  Initial evaluation for acute encephalopathy, migraine, mass on prior head CT. EXAM: MRI HEAD WITHOUT AND WITH CONTRAST TECHNIQUE: Multiplanar, multiecho pulse sequences of the brain and surrounding structures were obtained without and with intravenous contrast. CONTRAST:  7.47m GADAVIST GADOBUTROL 1 MMOL/ML IV SOLN COMPARISON:  Prior head CT from 02/03/2020. FINDINGS: Brain: There is an infiltrative mass centered at the anterior inter hemispheric fissure, with extension along the fornices, septum pellucidum, and anterior genu of the corpus callosum. Lesion demonstrates heterogeneous T2/FLAIR signal intensity with fairly avid heterogeneous postcontrast enhancement. Exact measurements of this lesion difficult given location and infiltrative nature. Mild central restricted diffusion related to necrosis and/or hypercellularity. Extension into the left greater than right periventricular white matter. The adjacent frontal horns are somewhat compressed and irregular by the adjacent tumor. Mass effect on the adjacent foramen of Monro with associated lateral ventriculomegaly. No transependymal flow of CSF or herniation. No distant subarachnoid seeding. Trace right-to-left shift at the septum pellucidum. No other mass lesion or abnormal enhancement. No evidence for acute or subacute infarct. Gray-white matter differentiation maintained. No encephalomalacia to suggest chronic cortical infarction. No other evidence for acute or chronic intracranial  hemorrhage. No made of a  partially empty sella. Note made of a 1.8 cm arachnoid cyst at the left middle cranial fossa. Vascular: Major intracranial vascular flow voids are maintained. Skull and upper cervical spine: Craniocervical junction within normal limits. Upper cervical spine normal. Bone marrow signal intensity within normal limits. No focal marrow replacing lesion. No scalp soft tissue abnormality. Sinuses/Orbits: Globes and orbital soft tissues within normal limits are otherwise clear. No significant mastoid effusion. Inner ear structures grossly normal. Noted within the ethmoidal air cells. Paranasal sinuses Other: None. IMPRESSION: 1. Interhemispheric mass centered at the genu of the corpus callosum, septum pellucidum, and fornices, with extension into the left greater than right periventricular white matter. 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 Lymphoma would be the primary differential consideration. 2. Secondary mass effect on the foramen of Monro with lateral ventriculomegaly. No transependymal flow of CSF, significant midline shift, or herniation. Electronically Signed   By: Jeannine Boga M.D.   On: 02/04/2020 02:37   CT ABDOMEN PELVIS W CONTRAST  Result Date: 02/05/2020 CLINICAL DATA:  64 year old female with history of brain cancer. Staging examination. EXAM: CT CHEST, ABDOMEN, AND PELVIS WITH CONTRAST TECHNIQUE: Multidetector CT imaging of the chest, abdomen and pelvis was performed following the standard protocol during bolus administration of intravenous contrast. CONTRAST:  134m OMNIPAQUE IOHEXOL 300 MG/ML  SOLN COMPARISON:  No priors. FINDINGS: CT CHEST FINDINGS Cardiovascular: Heart size is normal. Small amount of pericardial fluid in a superior pericardial recess. No other significant pericardial thickening or calcification. Aortic atherosclerosis. No definite calcified atherosclerotic plaque identified in the coronary arteries. Mediastinum/Nodes: No  pathologically enlarged mediastinal or hilar lymph nodes. Esophagus is unremarkable in appearance. No axillary lymphadenopathy. Lungs/Pleura: 3 mm subpleural nodule in the periphery of the right upper lobe (axial image 31 of series 5). No other larger more suspicious appearing pulmonary nodules or masses are noted. No acute consolidative airspace disease. Small amount of dependent atelectasis and/or scarring in the right lower lobe posteriorly. Trace left pleural effusion. No right pleural effusion. Musculoskeletal: Multiple old healed right-sided rib fractures. There are no aggressive appearing lytic or blastic lesions noted in the visualized portions of the skeleton. CT ABDOMEN PELVIS FINDINGS Hepatobiliary: No suspicious cystic or solid hepatic lesions. No intra or extrahepatic biliary ductal dilatation. High attenuation material filling the lumen of the gallbladder, likely related to vicarious excretion of contrast material from yesterday's contrast enhanced CT examinations. No surrounding inflammatory changes. Gallbladder is not distended. Pancreas: No pancreatic mass. No pancreatic ductal dilatation. No pancreatic or peripancreatic fluid collections or inflammatory changes. Spleen: Unremarkable. Adrenals/Urinary Tract: Bilateral kidneys and adrenal glands are normal in appearance. No hydroureteronephrosis. Urinary bladder is filled with iodinated contrast material, and along the posterolateral aspect of the urinary bladder wall on the right side there are several small filling defects abutting the wall measuring up to 6 mm (sagittal image 56 of series 7). Stomach/Bowel: The appearance of the stomach is normal. No pathologic dilatation of small bowel or colon. Normal appendix. Vascular/Lymphatic: Aortic atherosclerosis, without evidence of aneurysm or dissection in the abdominal or pelvic vasculature. No lymphadenopathy noted in the abdomen or pelvis. Reproductive: Uterus and ovaries are unremarkable in  appearance. Other: No significant volume of ascites.  No pneumoperitoneum. Musculoskeletal: There are no aggressive appearing lytic or blastic lesions noted in the visualized portions of the skeleton. IMPRESSION: 1. No definitive evidence to suggest metastatic disease to the chest, abdomen or pelvis. 2. Small filling defects in the posterolateral aspect of the urinary bladder on the right side. This  could represent some debris, however, these are intimately associated with the bladder wall such that small urothelial lesions are difficult to entirely exclude. Referral to Urology for further evaluation is recommended in the near future. 3. Small 3 mm subpleural nodule in the periphery of the right upper lobe is nonspecific, but statistically likely a benign subpleural lymph node. No follow-up needed if patient is low-risk. Non-contrast chest CT can be considered in 12 months if patient is high-risk. This recommendation follows the consensus statement: Guidelines for Management of Incidental Pulmonary Nodules Detected on CT Images: From the Fleischner Society 2017; Radiology 2017; 284:228-243. 4. Trace left pleural effusion. 5. Aortic atherosclerosis. 6. Additional incidental findings, as above. Electronically Signed   By: Vinnie Langton M.D.   On: 02/05/2020 08:15   CT Code Stroke Cerebral Perfusion with contrast  Result Date: 02/04/2020 CLINICAL DATA:  Follow-up examination for acute encephalopathy. EXAM: CT ANGIOGRAPHY HEAD AND NECK CT PERFUSION BRAIN TECHNIQUE: Multidetector CT imaging of the head and neck was performed using the standard protocol during bolus administration of intravenous contrast. Multiplanar CT image reconstructions and MIPs were obtained to evaluate the vascular anatomy. Carotid stenosis measurements (when applicable) are obtained utilizing NASCET criteria, using the distal internal carotid diameter as the denominator. Multiphase CT imaging of the brain was performed following IV bolus  contrast injection. Subsequent parametric perfusion maps were calculated using RAPID software. CONTRAST:  139m OMNIPAQUE IOHEXOL 350 MG/ML SOLN COMPARISON:  Prior head CT from 02/03/2020 as well as brain MRI from earlier the same day. FINDINGS: CTA NECK FINDINGS Aortic arch: Visualized aortic arch of normal caliber with normal branch pattern. Minimal plaque within the arch itself. No hemodynamically significant stenosis about the origin of the great vessels. Visualized subclavian arteries widely patent. Right carotid system: Right common carotid artery widely patent from its origin to the bifurcation without stenosis. Minimal centric calcified plaque at the right bifurcation without significant narrowing. Right ICA widely patent distally to the skull base without stenosis, dissection or occlusion. Left carotid system: Left common and internal carotid arteries widely patent without stenosis, dissection or occlusion. Vertebral arteries: Both vertebral arteries arise from the subclavian arteries. Vertebral arteries widely patent without stenosis, dissection or occlusion. Skeleton: No acute osseous abnormality. No discrete or worrisome osseous lesions. Moderate cervical spondylosis at C5-6 without high-grade stenosis. Focal sclerosis within the T4 vertebral body noted, nonspecific, but most likely benign. Other neck: No other acute soft tissue abnormality within the neck. No mass lesion or adenopathy. Upper chest: Scattered atelectatic changes noted within the visualized lungs. Visualized upper chest demonstrates no other acute finding. Review of the MIP images confirms the above findings CTA HEAD FINDINGS Anterior circulation: Internal carotid arteries patent to the termini without stenosis or other abnormality. A1 segments patent. Normal anterior communicator complex. Anterior cerebral arteries patent to their distal aspects without stenosis. No M1 stenosis or occlusion. Normal MCA bifurcations. Distal MCA branches  well perfused and symmetric. Posterior circulation: Both vertebral arteries widely patent to the vertebrobasilar junction. Both picas patent. Basilar widely patent to its distal aspect without stenosis. Superior cerebellar and posterior cerebral arteries widely patent bilaterally. Venous sinuses: Grossly patent allowing for timing the contrast bolus. Anatomic variants: None significant. Review of the MIP images confirms the above findings CT Brain Perfusion Findings: ASPECTS: Not applicable. CBF (<30%) Volume: 038mPerfusion (Tmax>6.0s) volume: 18m518mismatch Volume: 18mL52mfarction Location:Negative CT perfusion. IMPRESSION: 1. Normal CTA of the head and neck. No large vessel occlusion, hemodynamically significant stenosis, or other acute vascular  abnormality. 2. Negative CT perfusion. Electronically Signed   By: Jeannine Boga M.D.   On: 02/04/2020 03:22   DG Chest Port 1 View  Result Date: 02/03/2020 CLINICAL DATA:  Altered mental status EXAM: PORTABLE CHEST 1 VIEW COMPARISON:  Partial comparison to limited CT chest dated 03/08/2012 FINDINGS: Mild increased interstitial markings. Left lower lobe opacity, likely atelectasis. No pleural effusion or pneumothorax. The heart is normal in size. Old right 3rd and 4th rib fracture deformities. IMPRESSION: Mild left lower lobe opacity, likely atelectasis. Electronically Signed   By: Julian Hy M.D.   On: 02/03/2020 11:41   EEG adult  Result Date: 02/04/2020 Lora Havens, MD     02/04/2020  1:21 PM Patient Name: Alicia Mcdonald MRN: [id number] Epilepsy Attending: Lora Havens Referring Physician/Provider: Dr. Heath Lark Date: 02/04/2020 Duration: 25.33 mins Patient history: 64 year old female presented with nausea vomiting headache and encephalopathy.  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  EEG evaluate for seizures. Level of alertness: awake, asleep AEDs during EEG study: Keppra Technical aspects: This EEG  study was done with scalp electrodes positioned according to the 10-20 International system of electrode placement. Electrical activity was acquired at a sampling rate of 500Hz  and reviewed with a high frequency filter of 70Hz  and a low frequency filter of 1Hz . EEG data were recorded continuously and digitally stored. Description: The posterior dominant rhythm consists of 9-10 Hz activity of moderate voltage (25-35 uV) seen predominantly in posterior head regions, symmetric and reactive to eye opening and eye closing. Sleep was characterized by vertex waves, sleep spindles (12 to 14 Hz), maximal frontocentral region. EEG showed continuous generalized 5-6 Hz theta as well as intermittent 2-3Hz  delta slowing. Hyperventilation and photic stimulation were not performed.   ABNORMALITY -Continuous slow, generalized IMPRESSION: This study is suggestive of mild to moderate diffuse encephalopathy, nonspecific etiology. No seizures or epileptiform discharges were seen throughout the recording. Lora Havens   CT HEAD CODE STROKE WO CONTRAST  Addendum Date: 02/03/2020   ADDENDUM REPORT: 02/03/2020 10:18 ADDENDUM: This study was inadvertently signed before the report was completed, please disregard the above. Interhemispheric mass centered at the genu of the corpus callosum, fornices, and septum pellucidum with peripheral isodensity and central low-density. Ill-defined low-density seen in the bifrontal white matter. Findings are primarily concerning for aggressive glioma or lymphoma. There is mass effect on the foramen Monro with mild lateral ventriculomegaly but no herniation or shift. No acute hemorrhage. Case discussed with Dr. Rogene Houston Electronically Signed   By: Monte Fantasia M.D.   On: 02/03/2020 10:18   Result Date: 02/03/2020 CLINICAL DATA:  Code stroke. EXAM: CT HEAD WITHOUT CONTRAST TECHNIQUE: Contiguous axial images were obtained from the base of the skull through the vertex without intravenous contrast.  COMPARISON:  None. FINDINGS: Brain: Masslike appearance at the anterior interhemispheric fissure along the fornices, genu of the corpus callosum, and septum pellucidum. The size of peripheral isodense appearance and central low-density. Vascular: Skull: Sinuses/Orbits: Other: These results were called by telephone at the time of interpretation on 02/03/2020 at 10:10 am to provider Fredia Sorrow , who verbally acknowledged these results. ASPECTS [facility] Stroke Program Early CT Score) - Ganglionic level infarction (caudate, lentiform nuclei, internal capsule, insula, M1-M3 cortex): - Supraganglionic infarction (M4-M6 cortex): Total score (0-10 with 10 being normal): IMPRESSION: 1. 2. ASPECTS is Electronically Signed: By: Monte Fantasia M.D. On: 02/03/2020 10:12       IMPRESSION/PLAN: 1. 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 Dr. Lisbeth Renshaw discusses the pathology findings and reviews the nature of primary brain malignancy. He discusses the rationale for chemoRT with Temodar. Dr. Mickeal Skinner has directed the patient thus far and we will coordinate dates to being therapy. We discussed the risks, benefits, short, and long term effects of radiotherapy, and the patient is interested in proceeding. Dr. Lisbeth Renshaw discusses the delivery and logistics of radiotherapy and anticipates a course of 6 weeks of radiotherapy. She will sign consent on Thursday when she comes to simulate. We anticipate beginning therapy on 03/10/20. She is in agreement with this plan. 2. Seizure activity at presentation. The patient understands she needs a driver until she's seizure free from 6 months. Dr. Mickeal Skinner will continue to manage antiepileptic medications as well.    Given current concerns for patient exposure during the COVID-19 pandemic, this encounter was conducted via telephone.  The patient has provided two factor identification and has given verbal consent for this type of  encounter and has been advised to only accept a meeting of this type in a secure network environment. The time spent during this encounter was 45 minutes including preparation, discussion, and coordination of the patient's care. The attendants for this meeting include Blenda Nicely, RN, Dr. Lisbeth Renshaw, Hayden Pedro  and Glenna Fellows.  During the encounter,  Blenda Nicely, RN, Dr. Lisbeth Renshaw, and Hayden Pedro were located at [facility] Radiation Oncology Department.  Alicia Mcdonald was located at home.    The above documentation reflects my direct findings during this shared patient visit. Please see the separate note by Dr. Lisbeth Renshaw on this date for the remainder of the patient's plan of care.    Carola Rhine, PAC

## 2020-02-26 NOTE — Telephone Encounter (Signed)
Oral Oncology Patient Advocate Encounter  Received notification from [facility] that prior authorization for Temozolomide is required.  PA submitted on CoverMyMeds Key 20mg  [id number]        100mg  [id number] Status is pending  Oral Oncology Clinic will continue to follow.  [place] [facility] [place] Phone [phone] Fax [phone] 02/26/2020 2:48 PM

## 2020-02-26 NOTE — Telephone Encounter (Signed)
Oral Oncology Pharmacist Encounter  Received new prescription for Temodar (temozolomide) for the treatment of high grade glioma in conjunction with XRT, planned duration until the end of XRT. Planned start 6/28.  Prescription dose and frequency assessed.   Current medication list in Epic reviewed, no DDIs with temozolomide identified.  Prescription has been e-scribed to the [facility] for benefits analysis and approval.  Oral Oncology Clinic will continue to follow for insurance authorization, copayment issues, initial counseling and start date.  Darl Pikes, PharmD, BCPS, BCOP, CPP Hematology/Oncology Clinical Pharmacist Practitioner [facility]/AP [facility] [phone]  02/26/2020 2:41 PM

## 2020-02-28 ENCOUNTER — Ambulatory Visit
Admission: RE | Admit: 2020-02-28 | Discharge: 2020-02-28 | Disposition: A | Discharge status: Home / Self Care | Payer: BC Managed Care – PPO | Source: Ambulatory Visit | Attending: Radiation Oncology | Admitting: Radiation Oncology

## 2020-02-28 ENCOUNTER — Other Ambulatory Visit: Payer: Self-pay

## 2020-02-28 VITALS — BP 127/79 | HR 68 | Temp 97.3°F | Resp 18 | Wt 152.6 lb

## 2020-02-28 DIAGNOSIS — C719 Malignant neoplasm of brain, unspecified: Secondary | ICD-10-CM | POA: Diagnosis present

## 2020-02-28 DIAGNOSIS — Z510 Encounter for antineoplastic radiation therapy: Secondary | ICD-10-CM | POA: Insufficient documentation

## 2020-02-28 MED ORDER — SODIUM CHLORIDE 0.9% FLUSH
10.0000 mL | Freq: Once | INTRAVENOUS | Status: AC
  Administered 2020-02-28: 10 mL via INTRAVENOUS

## 2020-02-28 NOTE — Telephone Encounter (Signed)
Oral Chemotherapy Pharmacist Encounter  Alicia Mcdonald will deliver Temodar on 03/04/20. She knows to start the Temodar along with her XRT.  Patient Education I spoke with patient for overview of new oral chemotherapy medication: Temodar (temozolomide) for the treatment of high grade glioma in conjunction with XRT, planned duration until the end of XRT. Planned start 6/28.   Counseled patient on administration, dosing, side effects, monitoring, drug-food interactions, safe handling, storage, and disposal. Patient will take one 20mg  capsule and one 100mg  capsule by mouth daily. May take on an empty stomach to decrease nausea & vomiting.  Side effects include but not limited to: N/V, constipation, decreased wbc.    Reviewed with patient importance of keeping a medication schedule and plan for any missed doses.  Alicia Mcdonald voiced understanding and appreciation. All questions answered. Medication handout placed in the mail.  Provided patient with [facility] phone number. Patient knows to call the office with questions or concerns. Oral Chemotherapy Navigation Clinic will continue to follow.  Darl Pikes, PharmD, BCPS, BCOP, CPP Hematology/Oncology Clinical Pharmacist Practitioner [facility]/AP [facility] [place] [facility] [phone]  02/28/2020 4:44 PM

## 2020-02-28 NOTE — Progress Notes (Signed)
Has armband been applied?  Yes  Does patient have an allergy to IV contrast dye?: No   Has patient ever received premedication for IV contrast dye?: n/a  Does patient take metformin?: no  If patient does take metformin when was the last dose: n/a  Date of lab work: 02/09/2020 BUN: 17 CR: 0.70 Egfr: >60  IV site: Right AC  Has IV site been added to flowsheet?  Yes  BP 127/79   Pulse 68   Temp (!) 97.3 F (36.3 C)   Resp 18   SpO2 100%    Wt Readings from Last 3 Encounters:  02/28/20 152 lb 9.6 oz (69.2 kg)  02/26/20 149 lb (67.6 kg)  02/25/20 149 lb 8 oz (67.8 kg)

## 2020-02-29 ENCOUNTER — Telehealth: Payer: Self-pay | Admitting: *Deleted

## 2020-02-29 NOTE — Telephone Encounter (Signed)
[facility] Psychosocial Distress Screening Clinical Social Work  Clinical Social Work was referred by distress screening protocol.  The patient scored a 7 on the Psychosocial Distress Thermometer which indicates moderate distress. Clinical Social Worker attempted contacted patient by phone to assess for distress and other psychosocial needs.   ONCBCN DISTRESS SCREENING 02/26/2020  Screening Type Initial Screening  Distress experienced in past week (1-10) 7  Emotional problem type Adjusting to illness  Other Contact via phone    Clinical Social Worker follow up needed: Yes.    If yes, follow up plan: Patient did not answer. CSW left voicemail requesting patient return call.  Gwinda Maine, LCSW

## 2020-03-03 ENCOUNTER — Telehealth: Payer: Self-pay | Admitting: *Deleted

## 2020-03-03 MED FILL — TEMOZOLOMIDE 20 MG CAPS: 20 | 28 days supply | Qty: 28 | Fill #0

## 2020-03-03 MED FILL — TEMOZOLOMIDE 100 MG CAPS: 100 | 28 days supply | Qty: 28 | Fill #0

## 2020-03-03 NOTE — Telephone Encounter (Signed)
CSW made second attempt to reach patient to discus distress screening.CSW  left voicemail for patient to return call.   Maryjean Morn, MSW, LCSW, OSW-C Clinical Social Worker [facility] [phone]

## 2020-03-04 ENCOUNTER — Telehealth: Payer: Self-pay | Admitting: Internal Medicine

## 2020-03-04 NOTE — Telephone Encounter (Signed)
Scheduled apt per 6/21 sch message - unable to reach pt . Left message and mailed reminder letter .

## 2020-03-07 DIAGNOSIS — C719 Malignant neoplasm of brain, unspecified: Secondary | ICD-10-CM | POA: Diagnosis not present

## 2020-03-10 ENCOUNTER — Ambulatory Visit
Admission: RE | Admit: 2020-03-10 | Discharge: 2020-03-10 | Disposition: A | Discharge status: Home / Self Care | Payer: BC Managed Care – PPO | Source: Ambulatory Visit | Attending: Radiation Oncology | Admitting: Radiation Oncology

## 2020-03-10 ENCOUNTER — Other Ambulatory Visit: Payer: Self-pay

## 2020-03-10 DIAGNOSIS — C719 Malignant neoplasm of brain, unspecified: Secondary | ICD-10-CM | POA: Diagnosis not present

## 2020-03-11 ENCOUNTER — Ambulatory Visit
Admission: RE | Admit: 2020-03-11 | Discharge: 2020-03-11 | Disposition: A | Discharge status: Home / Self Care | Payer: BC Managed Care – PPO | Source: Ambulatory Visit | Attending: Radiation Oncology | Admitting: Radiation Oncology

## 2020-03-11 ENCOUNTER — Other Ambulatory Visit: Payer: Self-pay

## 2020-03-11 DIAGNOSIS — C719 Malignant neoplasm of brain, unspecified: Secondary | ICD-10-CM | POA: Diagnosis not present

## 2020-03-12 ENCOUNTER — Ambulatory Visit
Admission: RE | Admit: 2020-03-12 | Discharge: 2020-03-12 | Disposition: A | Discharge status: Home / Self Care | Payer: BC Managed Care – PPO | Source: Ambulatory Visit | Attending: Radiation Oncology | Admitting: Radiation Oncology

## 2020-03-12 ENCOUNTER — Other Ambulatory Visit: Payer: Self-pay

## 2020-03-12 DIAGNOSIS — C719 Malignant neoplasm of brain, unspecified: Secondary | ICD-10-CM | POA: Diagnosis not present

## 2020-03-13 ENCOUNTER — Other Ambulatory Visit: Payer: Self-pay

## 2020-03-13 ENCOUNTER — Ambulatory Visit
Admission: RE | Admit: 2020-03-13 | Discharge: 2020-03-13 | Disposition: A | Discharge status: Home / Self Care | Payer: BC Managed Care – PPO | Source: Ambulatory Visit | Attending: Radiation Oncology | Admitting: Radiation Oncology

## 2020-03-13 DIAGNOSIS — C719 Malignant neoplasm of brain, unspecified: Secondary | ICD-10-CM | POA: Insufficient documentation

## 2020-03-13 DIAGNOSIS — Z510 Encounter for antineoplastic radiation therapy: Secondary | ICD-10-CM | POA: Diagnosis present

## 2020-03-13 NOTE — ED Provider Notes (Signed)
[facility] Provider Note   CSN: [id number] Arrival date & time: 02/03/20  5597     History Chief Complaint  Patient presents with  . Altered Mental Status    Alicia Mcdonald is a 64 y.o. female.  Patient brought in by EMS.  Complaint of migraine headache yesterday that was getting worse throughout the day.  She awoke this morning around 4:30 in the morning and friend told EMS patient was normal until about 5 AM.  When patient became confused.  She moves all extremities and has been somewhat combative with EMS.  Blood sugar was 223.  EMS reported vital signs to be stable.  EMS also reported room air sats were 92%.  Patient was placed on 2 L and oxygen sats came up to 96%.  Upon arrival patient was confused and unable to follow commands.  After my assessment I called a code stroke which was initiated at 950 2 in the morning.  Patient will be taken to CT scan.  Additional information per family.  The patient's mother states she was fine yesterday before going to bed other than the headache.  Mother states she found patient in bathroom at 3 in the morning vomiting and complaining of migraine with floaters.  Patient slightly confused then but then became severely confused right prior to EMS being called at 8 in the morning.        Past Medical History:  Diagnosis Date  . Cancer ([place])   . Iron deficiency   . Migraine   . Vitamin D deficiency     Patient Active Problem List   Diagnosis Date Noted  . Goals of care, counseling/discussion 02/25/2020  . Altered mental status   . 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 Seizure ([place]) 02/03/2020  . Encounter for screening colonoscopy 04/17/2013    Past Surgical History:  Procedure Laterality Date  . APPLICATION OF CRANIAL NAVIGATION N/A 02/06/2020   Procedure: APPLICATION OF CRANIAL NAVIGATION;  Surgeon: Vallarie Mare, MD;  Location: [facility];  Service: Neurosurgery;  Laterality: N/A;  .  COLONOSCOPY N/A 04/24/2013   Procedure: COLONOSCOPY;  Surgeon: Danie Binder, MD;  [facility];  Service: Endoscopy;  Laterality: N/A;  1:45  . FRAMELESS  BIOPSY WITH BRAINLAB N/A 02/06/2020   Procedure: FRAMELESS BIOPSY WITH Lucky Rathke;  Surgeon: Vallarie Mare, MD;  Location: [facility];  Service: Neurosurgery;  Laterality: N/A;  . [place]  . VENTRICULOSTOMY N/A 02/06/2020   Procedure: VENTRICULOSTOMY AND ENDOSCOPIC SEPTOSTOMY;  Surgeon: Vallarie Mare, MD;  Location: [facility];  Service: Neurosurgery;  Laterality: N/A;     OB History    Gravida  0   Para  0   Term  0   Preterm  0   AB  0   Living  0     SAB  0   TAB  0   Ectopic  0   Multiple  0   Live Births  0           Family History  Problem Relation Age of Onset  . Colon cancer Neg Hx     Social History   Tobacco Use  . Smoking status: Never Smoker  . Smokeless tobacco: Never Used  Substance Use Topics  . Alcohol use: Yes    Comment: once a month, rare  . Drug use: No    Home Medications Prior to Admission medications   Medication  Sig Start Date End Date Taking? Authorizing Provider  cholecalciferol (VITAMIN D) 1000 UNITS tablet Take 1,000 Units by mouth daily.   Yes [provider]  blood glucose meter kit and supplies KIT Check blood sugars three times a day before meals and use insulin as instructed 02/10/20   Barb Merino, MD  insulin aspart (NOVOLOG) 100 UNIT/ML FlexPen Inject 1-9 Units into the skin 3 (three) times daily with meals. CBG 70 - 120: 0 units ,CBG 121 - 150: 0 unit ,CBG 151 - 200: 2 units ,CBG 201 - 250: 3 units, CBG 251 - 300: 5 units, CBG 301 - 350: 7 units, CBG 351 - 400: 9 units CBG > 400: call your doctor. 02/10/20   Barb Merino, MD  Insulin Pen Needle 31G X 5 MM MISC 1 each by Does not apply route 3 (three) times daily before meals. 02/10/20   Barb Merino, MD  Lancets (ONETOUCH DELICA PLUS [id number]) MISC USE TO TEST BLOOD SUGARAUP TO 4  TIMES DAILY. 02/25/20   [provider]  levETIRAcetam (KEPPRA) 500 MG tablet Take 1 tablet (500 mg total) by mouth 2 (two) times daily. 02/10/20 05/10/20  Barb Merino, MD  ondansetron (ZOFRAN) 8 MG tablet Take 1 tablet (8 mg total) by mouth 2 (two) times daily as needed (nausea and vomiting). May take 30-60 minutes prior to Temodar administration if nausea/vomiting occurs. 02/26/20   Vaslow, Acey Lav, MD  ONETOUCH ULTRA test strip USE TO TEST BLOOD SUGAR_UP TO 4 TIMES DAILY._ 02/25/20   [provider]  pantoprazole (PROTONIX) 40 MG tablet Take 1 tablet (40 mg total) by mouth at bedtime. 02/10/20 03/11/20  Barb Merino, MD  temozolomide (TEMODAR) 100 MG capsule Take 1 capsule (100 mg total) by mouth daily. May take on an empty stomach to decrease nausea & vomiting. 02/26/20   Ventura Sellers, MD  temozolomide (TEMODAR) 20 MG capsule Take 1 capsule (20 mg total) by mouth daily. May take on an empty stomach to decrease nausea & vomiting. 02/26/20   Ventura Sellers, MD    Allergies    Tylenol [acetaminophen]  Review of Systems   Review of Systems  Unable to perform ROS: Mental status change    Physical Exam Updated Vital Signs BP 117/71 (BP Location: Left Arm)   Pulse 61   Temp 97.9 F (36.6 C) (Oral)   Resp 16   Ht 1.727 m (5' 8" )   Wt 70.3 kg   SpO2 97%   BMI 23.57 kg/m   Physical Exam Vitals and nursing note reviewed.  Constitutional:      General: She is not in acute distress.    Appearance: Normal appearance. She is well-developed.  HENT:     Head: Normocephalic and atraumatic.  Eyes:     Extraocular Movements: Extraocular movements intact.     Conjunctiva/sclera: Conjunctivae normal.     Pupils: Pupils are equal, round, and reactive to light.  Cardiovascular:     Rate and Rhythm: Normal rate and regular rhythm.     Heart sounds: No murmur heard.   Pulmonary:     Effort: Pulmonary effort is normal. No respiratory distress.     Breath sounds: Normal  breath sounds.  Abdominal:     Palpations: Abdomen is soft.     Tenderness: There is no abdominal tenderness.  Musculoskeletal:        General: No swelling. Normal range of motion.     Cervical back: Neck supple.  Skin:    General:  Skin is warm and dry.     Capillary Refill: Capillary refill takes less than 2 seconds.  Neurological:     Mental Status: She is alert.     Comments: Patient not following commands.  Seems to be spontaneously moving all extremities.     ED Results / Procedures / Treatments   Labs (all labs ordered are listed, but only abnormal results are displayed) Labs Reviewed  DIFFERENTIAL - Abnormal; Notable for the following components:      Result Value   Neutro Abs 8.3 (*)    Lymphs Abs 0.5 (*)    All other components within normal limits  COMPREHENSIVE METABOLIC PANEL - Abnormal; Notable for the following components:   Sodium 126 (*)    Potassium 3.4 (*)    Chloride 90 (*)    Glucose, Bld 209 (*)    All other components within normal limits  URINALYSIS, ROUTINE W REFLEX MICROSCOPIC - Abnormal; Notable for the following components:   Glucose, UA 150 (*)    Ketones, ur 20 (*)    All other components within normal limits  HEMOGLOBIN A1C - Abnormal; Notable for the following components:   Hgb A1c MFr Bld 6.8 (*)    All other components within normal limits  GLUCOSE, CAPILLARY - Abnormal; Notable for the following components:   Glucose-Capillary 123 (*)    All other components within normal limits  GLUCOSE, CAPILLARY - Abnormal; Notable for the following components:   Glucose-Capillary 137 (*)    All other components within normal limits  GLUCOSE, CAPILLARY - Abnormal; Notable for the following components:   Glucose-Capillary 136 (*)    All other components within normal limits  GLUCOSE, CAPILLARY - Abnormal; Notable for the following components:   Glucose-Capillary 152 (*)    All other components within normal limits  COMPREHENSIVE METABOLIC PANEL -  Abnormal; Notable for the following components:   Glucose, Bld 156 (*)    Total Protein 6.0 (*)    Albumin 3.3 (*)    All other components within normal limits  GLUCOSE, CAPILLARY - Abnormal; Notable for the following components:   Glucose-Capillary 128 (*)    All other components within normal limits  GLUCOSE, CAPILLARY - Abnormal; Notable for the following components:   Glucose-Capillary 109 (*)    All other components within normal limits  GLUCOSE, CAPILLARY - Abnormal; Notable for the following components:   Glucose-Capillary 115 (*)    All other components within normal limits  GLUCOSE, CAPILLARY - Abnormal; Notable for the following components:   Glucose-Capillary 109 (*)    All other components within normal limits  GLUCOSE, CAPILLARY - Abnormal; Notable for the following components:   Glucose-Capillary 118 (*)    All other components within normal limits  BASIC METABOLIC PANEL - Abnormal; Notable for the following components:   Potassium 3.0 (*)    Calcium 8.7 (*)    All other components within normal limits  CSF CELL COUNT WITH DIFFERENTIAL - Abnormal; Notable for the following components:   Color, CSF PINK (*)    Appearance, CSF HAZY (*)    RBC Count, CSF 2,090 (*)    WBC, CSF 8 (*)    All other components within normal limits  CBC WITH DIFFERENTIAL/PLATELET - Abnormal; Notable for the following components:   WBC 10.7 (*)    Neutro Abs 9.6 (*)    Lymphs Abs 0.6 (*)    All other components within normal limits  BASIC METABOLIC PANEL - Abnormal; Notable for  the following components:   CO2 21 (*)    Glucose, Bld 184 (*)    Calcium 8.6 (*)    All other components within normal limits  CBC - Abnormal; Notable for the following components:   WBC 12.1 (*)    All other components within normal limits  GLUCOSE, CAPILLARY - Abnormal; Notable for the following components:   Glucose-Capillary 232 (*)    All other components within normal limits  GLUCOSE, CAPILLARY -  Abnormal; Notable for the following components:   Glucose-Capillary 198 (*)    All other components within normal limits  GLUCOSE, CAPILLARY - Abnormal; Notable for the following components:   Glucose-Capillary 193 (*)    All other components within normal limits  GLUCOSE, CAPILLARY - Abnormal; Notable for the following components:   Glucose-Capillary 277 (*)    All other components within normal limits  GLUCOSE, CAPILLARY - Abnormal; Notable for the following components:   Glucose-Capillary 254 (*)    All other components within normal limits  GLUCOSE, CAPILLARY - Abnormal; Notable for the following components:   Glucose-Capillary 235 (*)    All other components within normal limits  GLUCOSE, CAPILLARY - Abnormal; Notable for the following components:   Glucose-Capillary 245 (*)    All other components within normal limits  GLUCOSE, CAPILLARY - Abnormal; Notable for the following components:   Glucose-Capillary 212 (*)    All other components within normal limits  GLUCOSE, CAPILLARY - Abnormal; Notable for the following components:   Glucose-Capillary 164 (*)    All other components within normal limits  GLUCOSE, CAPILLARY - Abnormal; Notable for the following components:   Glucose-Capillary 117 (*)    All other components within normal limits  GLUCOSE, CAPILLARY - Abnormal; Notable for the following components:   Glucose-Capillary 150 (*)    All other components within normal limits  GLUCOSE, CAPILLARY - Abnormal; Notable for the following components:   Glucose-Capillary 123 (*)    All other components within normal limits  BASIC METABOLIC PANEL - Abnormal; Notable for the following components:   Potassium 3.2 (*)    Glucose, Bld 100 (*)    All other components within normal limits  GLUCOSE, CAPILLARY - Abnormal; Notable for the following components:   Glucose-Capillary 223 (*)    All other components within normal limits  GLUCOSE, CAPILLARY - Abnormal; Notable for the  following components:   Glucose-Capillary 122 (*)    All other components within normal limits  GLUCOSE, CAPILLARY - Abnormal; Notable for the following components:   Glucose-Capillary 124 (*)    All other components within normal limits  GLUCOSE, CAPILLARY - Abnormal; Notable for the following components:   Glucose-Capillary 128 (*)    All other components within normal limits  GLUCOSE, CAPILLARY - Abnormal; Notable for the following components:   Glucose-Capillary 139 (*)    All other components within normal limits  GLUCOSE, CAPILLARY - Abnormal; Notable for the following components:   Glucose-Capillary 212 (*)    All other components within normal limits  GLUCOSE, CAPILLARY - Abnormal; Notable for the following components:   Glucose-Capillary 107 (*)    All other components within normal limits  GLUCOSE, CAPILLARY - Abnormal; Notable for the following components:   Glucose-Capillary 140 (*)    All other components within normal limits  GLUCOSE, CAPILLARY - Abnormal; Notable for the following components:   Glucose-Capillary 162 (*)    All other components within normal limits  GLUCOSE, CAPILLARY - Abnormal; Notable for the following  components:   Glucose-Capillary 114 (*)    All other components within normal limits  CBG MONITORING, ED - Abnormal; Notable for the following components:   Glucose-Capillary 188 (*)    All other components within normal limits  CBG MONITORING, ED - Abnormal; Notable for the following components:   Glucose-Capillary 168 (*)    All other components within normal limits  POCT I-STAT 7, (LYTES, BLD GAS, ICA,H+H) - Abnormal; Notable for the following components:   pH, Arterial 7.495 (*)    pO2, Arterial 453 (*)    Bicarbonate 29.9 (*)    Acid-Base Excess 6.0 (*)    Potassium 3.2 (*)    All other components within normal limits  POCT I-STAT 7, (LYTES, BLD GAS, ICA,H+H) - Abnormal; Notable for the following components:   pH, Arterial 7.478 (*)     pCO2 arterial 30.8 (*)    pO2, Arterial 214 (*)    Potassium 3.1 (*)    All other components within normal limits  SARS CORONAVIRUS 2 BY RT PCR (HOSPITAL ORDER, [facility])  SURGICAL PCR SCREEN  CSF CULTURE  PROTIME-INR  APTT  CBC  ETHANOL  RAPID URINE DRUG SCREEN, HOSP PERFORMED  OSMOLALITY  OSMOLALITY, URINE  TSH  HIV ANTIBODY (ROUTINE TESTING W REFLEX)  CBC  GLUCOSE, CAPILLARY  GLUCOSE, CAPILLARY  GLUCOSE, CAPILLARY  GLUCOSE, CAPILLARY  GLUCOSE, CAPILLARY  GLUCOSE, CAPILLARY  MAGNESIUM  GLUCOSE, CAPILLARY  CD19 AND CD20, FLOW CYTOMETRY  PROTEIN AND GLUCOSE, CSF  GLUCOSE, CAPILLARY  TYPE AND SCREEN  ABO/RH  SURGICAL PATHOLOGY  CYTOLOGY - NON PAP    EKG EKG Interpretation  Date/Time:  Sunday Feb 03 2020 10:40:21 EDT Ventricular Rate:  67 PR Interval:    QRS Duration: 89 QT Interval:  428 QTC Calculation: 452 R Axis:   74 Text Interpretation: Sinus rhythm Nonspecific T abnormalities, lateral leads No old tracing to compare Confirmed by Dorie Rank [phone]) on 02/04/2020 8:32:34 PM   Radiology No results found.    No results found.   Procedures Procedures (including critical care time)   CRITICAL CARE Performed by: Fredia Sorrow Total critical care time: 60 minutes Critical care time was exclusive of separately billable procedures and treating other patients. Critical care was necessary to treat or prevent imminent or life-threatening deterioration. Critical care was time spent personally by me on the following activities: development of treatment plan with patient and/or surrogate as well as nursing, discussions with consultants, evaluation of patient's response to treatment, examination of patient, obtaining history from patient or surrogate, ordering and performing treatments and interventions, ordering and review of laboratory studies, ordering and review of radiographic studies, pulse oximetry and re-evaluation of patient's  condition.   Medications Ordered in ED Medications  sodium chloride flush (NS) 0.9 % injection 3 mL (3 mLs Intravenous Given 02/03/20 1035)  LORazepam (ATIVAN) injection 1 mg (1 mg Intravenous Given 02/03/20 1053)  dexamethasone (DECADRON) injection 10 mg (10 mg Intravenous Given 02/03/20 1053)  sodium chloride 0.9 % bolus 500 mL (0 mLs Intravenous Stopped 02/03/20 1125)  ondansetron (ZOFRAN) injection 4 mg (4 mg Intravenous Given 02/03/20 1054)  levETIRAcetam (KEPPRA) IVPB 1500 mg/ 100 mL premix (0 mg Intravenous Stopped 02/03/20 1123)  potassium chloride 10 mEq in 100 mL IVPB (10 mEq Intravenous New Bag/Given 02/03/20 2000)  LORazepam (ATIVAN) injection 1 mg (1 mg Intravenous Given 02/03/20 1720)  gadobutrol (GADAVIST) 1 MMOL/ML injection 7.5 mL (7.5 mLs Intravenous Contrast Given 02/04/20 0204)  iohexol (OMNIPAQUE) 350  MG/ML injection 115 mL (115 mLs Intravenous Contrast Given 02/04/20 0231)  iohexol (OMNIPAQUE) 300 MG/ML solution 100 mL (100 mLs Intravenous Contrast Given 02/05/20 0007)  Chlorhexidine Gluconate Cloth 2 % PADS 6 each (6 each Topical Given 02/05/20 2327)  Chlorhexidine Gluconate Cloth 2 % PADS 6 each (6 each Topical Given 02/06/20 0512)  chlorhexidine (PERIDEX) 0.12 % solution 15 mL (15 mLs Mouth/Throat Given 02/06/20 1150)    Or  MEDLINE mouth rinse ( Mouth Rinse See Alternative 02/06/20 1150)  ceFAZolin (ANCEF) IVPB 1 g/50 mL premix ( Intravenous Stopped 02/07/20 0932)  dexamethasone (DECADRON) injection 6 mg (6 mg Intravenous Given 02/07/20 1230)  labetalol (NORMODYNE) 5 MG/ML injection (  Override pull for Anesthesia 02/06/20 1615)  living well with diabetes book MISC ( Does not apply Given 02/07/20 1716)  dexamethasone (DECADRON) tablet 4 mg (4 mg Oral Given 02/08/20 1307)    ED Course  I have reviewed the triage vital signs and the nursing notes.  Pertinent labs & imaging results that were available during my care of the patient were reviewed by me and considered in my medical  decision making (see chart for details).    MDM Rules/Calculators/A&P                          Code stroke activated clinically was a concern due to the history of a headache about a head bleed.  CT head showed anterior hemispheric brain mass suspicious for glioma versus lymphoma.  Teleneurology and I both thought that the patient may have had a seizure.  So patient was loaded with Keppra and was also given some Ativan.  Also started IV dexamethasone at 10 mg and recommendations were made for brain MRI as well as EEG evaluation.  Discussed with neurosurgery Dr. Christella Noa who recommends transfer to [facility] for further evaluation.  Hospitalist contacted for admission.  Patient's electrolytes showed a sodium of 126 but her glucose was 209.  Potassium slightly low at 3.4.  Covid testing ordered.  Patient did have a past medical history of migraines.  Patient's airway remained intact while she was in the emergency department.   Final Clinical Impression(s) / ED Diagnoses Final diagnoses:  Altered mental status, unspecified altered mental status type  Brain neoplasm [facility])    Rx / DC Orders ED Discharge Orders         Ordered    levETIRAcetam (KEPPRA) 500 MG tablet  2 times daily     Discontinue  Reprint     02/10/20 1014    pantoprazole (PROTONIX) 40 MG tablet  Daily at bedtime     Reprint     02/10/20 1014    dexamethasone (DECADRON) 4 MG tablet  Status:  Discontinued     Reprint     02/10/20 1014    insulin aspart (NOVOLOG) 100 UNIT/ML FlexPen  3 times daily with meals     Discontinue  Reprint     02/10/20 1024    Insulin Pen Needle 31G X 5 MM MISC  3 times daily before meals     Discontinue  Reprint     02/10/20 1024    blood glucose meter kit and supplies KIT     Discontinue  Reprint     02/10/20 1024    Increase activity slowly     Discontinue     02/10/20 1024    Diet Carb Modified     Discontinue     02/10/20 1024  No dressing needed     Discontinue    Comments: Keep wound  clean and dry until clinic follow up   02/10/20 1024    Call MD for:  temperature >100.4     Discontinue     02/10/20 1024    Call MD for:  redness, tenderness, or signs of infection (pain, swelling, redness, odor or green/yellow discharge around incision site)     Discontinue     02/10/20 1024    Call MD for:  severe uncontrolled pain     Discontinue     02/10/20 1024           Fredia Sorrow, MD 03/13/20 [phone]

## 2020-03-13 NOTE — Progress Notes (Signed)
Pt here for patient teaching.  Pt given Radiation and You booklet, skin care instructions and Sonafine.  Reviewed areas of pertinence such as fatigue, hair loss, nausea and vomiting, skin changes, headache and blurry vision . Pt able to give teach back of to pat skin and use unscented/gentle soap,apply Sonafine bid and avoid applying anything to skin within 4 hours of treatment. Pt verbalizes understanding of information given and will contact nursing with any questions or concerns.    Muna Demers M. Sharrieff Spratlin RN, BSN      

## 2020-03-14 ENCOUNTER — Ambulatory Visit
Admission: RE | Admit: 2020-03-14 | Discharge: 2020-03-14 | Disposition: A | Discharge status: Home / Self Care | Payer: BC Managed Care – PPO | Source: Ambulatory Visit | Attending: Radiation Oncology | Admitting: Radiation Oncology

## 2020-03-14 DIAGNOSIS — C719 Malignant neoplasm of brain, unspecified: Secondary | ICD-10-CM | POA: Diagnosis not present

## 2020-03-18 ENCOUNTER — Inpatient Hospital Stay: Payer: BC Managed Care – PPO

## 2020-03-18 ENCOUNTER — Other Ambulatory Visit: Payer: Self-pay

## 2020-03-18 ENCOUNTER — Ambulatory Visit
Admission: RE | Admit: 2020-03-18 | Discharge: 2020-03-18 | Disposition: A | Discharge status: Home / Self Care | Payer: BC Managed Care – PPO | Source: Ambulatory Visit | Attending: Radiation Oncology | Admitting: Radiation Oncology

## 2020-03-18 ENCOUNTER — Inpatient Hospital Stay: Payer: BC Managed Care – PPO | Attending: Internal Medicine | Admitting: Internal Medicine

## 2020-03-18 VITALS — BP 122/68 | HR 67 | Temp 97.7°F | Resp 18 | Ht 67.0 in | Wt 149.2 lb

## 2020-03-18 DIAGNOSIS — Z85820 Personal history of malignant melanoma of skin: Secondary | ICD-10-CM | POA: Diagnosis not present

## 2020-03-18 DIAGNOSIS — C719 Malignant neoplasm of brain, unspecified: Secondary | ICD-10-CM

## 2020-03-18 DIAGNOSIS — Z79899 Other long term (current) drug therapy: Secondary | ICD-10-CM | POA: Insufficient documentation

## 2020-03-18 DIAGNOSIS — Z7189 Other specified counseling: Secondary | ICD-10-CM | POA: Diagnosis not present

## 2020-03-18 DIAGNOSIS — C711 Malignant neoplasm of frontal lobe: Secondary | ICD-10-CM | POA: Insufficient documentation

## 2020-03-18 DIAGNOSIS — R569 Unspecified convulsions: Secondary | ICD-10-CM

## 2020-03-18 LAB — CMP (CANCER CENTER ONLY)
ALT: 14 U/L (ref 0–44)
AST: 13 U/L — ABNORMAL LOW (ref 15–41)
Albumin: 3.6 g/dL (ref 3.5–5.0)
Alkaline Phosphatase: 75 U/L (ref 38–126)
Anion gap: 5 (ref 5–15)
BUN: 14 mg/dL (ref 8–23)
CO2: 32 mmol/L (ref 22–32)
Calcium: 9.5 mg/dL (ref 8.9–10.3)
Chloride: 103 mmol/L (ref 98–111)
Creatinine: 0.8 mg/dL (ref 0.44–1.00)
GFR, Est AFR Am: >60 mL/min (ref >60)
GFR, Estimated: >60 mL/min (ref >60)
Glucose, Bld: 122 mg/dL — ABNORMAL HIGH (ref 70–99)
Potassium: 3.6 mmol/L (ref 3.5–5.1)
Sodium: 140 mmol/L (ref 135–145)
Total Bilirubin: 0.4 mg/dL (ref 0.3–1.2)
Total Protein: 6.8 g/dL (ref 6.5–8.1)

## 2020-03-18 LAB — CBC WITH DIFFERENTIAL (CANCER CENTER ONLY)
Abs Immature Granulocytes: 0.02 10*3/uL (ref 0.00–0.07)
Basophils Absolute: 0 10*3/uL (ref 0.0–0.1)
Basophils Relative: 1 %
Eosinophils Absolute: 0.1 10*3/uL (ref 0.0–0.5)
Eosinophils Relative: 1 %
HCT: 39.4 % (ref 36.0–46.0)
Hemoglobin: 12.7 g/dL (ref 12.0–15.0)
Immature Granulocytes: 0 %
Lymphocytes Relative: 23 %
Lymphs Abs: 1.3 10*3/uL (ref 0.7–4.0)
MCH: 29.7 pg (ref 26.0–34.0)
MCHC: 32.2 g/dL (ref 30.0–36.0)
MCV: 92.3 fL (ref 80.0–100.0)
Monocytes Absolute: 0.5 10*3/uL (ref 0.1–1.0)
Monocytes Relative: 9 %
Neutro Abs: 3.6 10*3/uL (ref 1.7–7.7)
Neutrophils Relative %: 66 %
Platelet Count: 303 10*3/uL (ref 150–400)
RBC: 4.27 MIL/uL (ref 3.87–5.11)
RDW: 13.3 % (ref 11.5–15.5)
WBC Count: 5.4 10*3/uL (ref 4.0–10.5)
nRBC: 0 % (ref 0.0–0.2)

## 2020-03-18 NOTE — Progress Notes (Signed)
[facility] at [place], [place] [address] [phone]   Interval Evaluation  Date of Service: 03/18/20 Patient Name: Alicia Mcdonald Patient MRN: [id number] Patient DOB: [birth year] Provider: Ventura Sellers, MD  Identifying Statement:  Alicia Mcdonald is a 64 y.o. female with bifrontal, callosal high grade glioma   Oncologic History: Oncology History  High grade glioma not classifiable by WHO criteria ([place])  02/06/2020 Surgery   Stereotactic biopsy by Dr. Marcello Moores   02/26/2020 -  Chemotherapy   The patient had temozolomide (TEMODAR) 20 MG capsule, 20 mg (100 % of original dose 20 mg), Oral, Daily, 1 of 1 cycle, Start date: 02/26/2020, End date: -- Dose modification: 20 mg (original dose 20 mg, Cycle 1) temozolomide (TEMODAR) 100 MG capsule, 100 mg (100 % of original dose 100 mg), Oral, Daily, 1 of 1 cycle, Start date: 02/26/2020, End date: -- Dose modification: 100 mg (original dose 100 mg, Cycle 1)  for chemotherapy treatment.      Biomarkers:  MGMT Unknown.  IDH 1/2 Wild type.  EGFR Unknown  TERT Unknown   Interval History:  Alicia Mcdonald presents today for follow up, now having completed two weeks of radiation and concurrent Temodar.  Her sister, with her today, notes short term memory issues, not significantly worse from prior.  Still ambulating on her own without aid.  No recurrence of seizures.  No nausea since starting the zofran prior to TMZ.  H+P (02/25/20) Patient presented to medical attention in May 2021 with sudden onset confusion and disorientation.  She was found by her mother in this state after being seen normal shortly prior.  Apparently had a clinical seizure on the way to ED or in the ED.  CNS imaging demonstrated enhancing mass within corpus callosum and fornix, extending into frontal lobes bilaterally.  She underwent biopsy and ventriculostomy placement on 5/26 with Dr. Marcello Moores; path was sent out to Dr.  Maisie Fus at [facility] and demonstrated ungradable IDH-1 wild type glioma.  By several days post-op she had returned to baseline funtional status, fully intact without complaint.  Today she denies any neurologic deficits aside from some memory impairment.    Medications: Current Outpatient Medications on File Prior to Visit  Medication Sig Dispense Refill  . blood glucose meter kit and supplies KIT Check blood sugars three times a day before meals and use insulin as instructed 1 each 0  . cholecalciferol (VITAMIN D) 1000 UNITS tablet Take 1,000 Units by mouth daily.    . insulin aspart (NOVOLOG) 100 UNIT/ML FlexPen Inject 1-9 Units into the skin 3 (three) times daily with meals. CBG 70 - 120: 0 units ,CBG 121 - 150: 0 unit ,CBG 151 - 200: 2 units ,CBG 201 - 250: 3 units, CBG 251 - 300: 5 units, CBG 301 - 350: 7 units, CBG 351 - 400: 9 units CBG > 400: call your doctor. 3 mL 11  . Insulin Pen Needle 31G X 5 MM MISC 1 each by Does not apply route 3 (three) times daily before meals. 100 each 0  . Lancets (ONETOUCH DELICA PLUS [id number]) MISC USE TO TEST BLOOD SUGARAUP TO 4 TIMES DAILY.    Marland Kitchen levETIRAcetam (KEPPRA) 500 MG tablet Take 1 tablet (500 mg total) by mouth 2 (two) times daily. 60 tablet 2  . ondansetron (ZOFRAN) 8 MG tablet Take 1 tablet (8 mg total) by mouth 2 (two) times daily as needed (nausea and vomiting). May take  30-60 minutes prior to Temodar administration if nausea/vomiting occurs. 30 tablet 1  . ONETOUCH ULTRA test strip USE TO TEST BLOOD SUGAR_UP TO 4 TIMES DAILY._    . temozolomide (TEMODAR) 100 MG capsule Take 1 capsule (100 mg total) by mouth daily. May take on an empty stomach to decrease nausea & vomiting. 42 capsule 0  . temozolomide (TEMODAR) 20 MG capsule Take 1 capsule (20 mg total) by mouth daily. May take on an empty stomach to decrease nausea & vomiting. 42 capsule 0  . pantoprazole (PROTONIX) 40 MG tablet Take 1 tablet (40 mg total) by mouth at bedtime. 30 tablet 0   No  current facility-administered medications on file prior to visit.    Allergies:  Allergies  Allergen Reactions  . Tylenol [Acetaminophen] Other (See Comments)    Blood in urine with excessive use   Past Medical History:  Past Medical History:  Diagnosis Date  . Cancer ([place])   . Iron deficiency   . Migraine   . Vitamin D deficiency    Past Surgical History:  Past Surgical History:  Procedure Laterality Date  . APPLICATION OF CRANIAL NAVIGATION N/A 02/06/2020   Procedure: APPLICATION OF CRANIAL NAVIGATION;  Surgeon: Vallarie Mare, MD;  Location: [facility];  Service: Neurosurgery;  Laterality: N/A;  . COLONOSCOPY N/A 04/24/2013   Procedure: COLONOSCOPY;  Surgeon: Danie Binder, MD;  [facility];  Service: Endoscopy;  Laterality: N/A;  1:45  . FRAMELESS  BIOPSY WITH BRAINLAB N/A 02/06/2020   Procedure: FRAMELESS BIOPSY WITH Lucky Rathke;  Surgeon: Vallarie Mare, MD;  Location: [facility];  Service: Neurosurgery;  Laterality: N/A;  . [place]  . VENTRICULOSTOMY N/A 02/06/2020   Procedure: VENTRICULOSTOMY AND ENDOSCOPIC SEPTOSTOMY;  Surgeon: Vallarie Mare, MD;  Location: [facility];  Service: Neurosurgery;  Laterality: N/A;   Social History:  Social History   Socioeconomic History  . Marital status: Single    Spouse name: Not on file  . Number of children: Not on file  . Years of education: Not on file  . Highest education level: Not on file  Occupational History  . 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 Smoking status: Never Smoker  . Smokeless tobacco: Never Used  Substance and Sexual Activity  . Alcohol use: Yes    Comment: once a month, rare  . Drug use: No  . Sexual activity: Not on file  Other Topics Concern  . Not on file  Social History Narrative  . Not on file   Social Determinants of Health   Financial Resource Strain:   . Difficulty of Paying  Living Expenses:   Food Insecurity:   . Worried About Charity fundraiser in the Last Year:   . Arboriculturist in the Last Year:   Transportation Needs:   . Film/video editor (Medical):   Marland Kitchen Lack of Transportation (Non-Medical):   Physical Activity:   . Days of Exercise per Week:   . Minutes of Exercise per Session:   Stress:   . Feeling of Stress :   Social Connections:   . Frequency of Communication with Friends and Family:   . Frequency of Social Gatherings with Friends and Family:   . Attends Religious Services:   . Active Member of Clubs or Organizations:   . Attends Archivist Meetings:   Marland Kitchen Marital Status:  Intimate Partner Violence:   . Fear of Current or Ex-Partner:   . Emotionally Abused:   Marland Kitchen Physically Abused:   . Sexually Abused:    Family History:  Family History  Problem Relation Age of Onset  . Colon cancer Neg Hx     Review of Systems: Constitutional: Doesn't report fevers, chills or abnormal weight loss Eyes: Doesn't report blurriness of vision Ears, nose, mouth, throat, and face: Doesn't report sore throat Respiratory: Doesn't report cough, dyspnea or wheezes Cardiovascular: Doesn't report palpitation, chest discomfort  Gastrointestinal:  Doesn't report nausea, constipation, diarrhea GU: Doesn't report incontinence Skin: Doesn't report skin rashes Neurological: Per HPI Musculoskeletal: Doesn't report joint pain Behavioral/Psych: Doesn't report anxiety  Physical Exam: Vitals:   03/18/20 0947  BP: 122/68  Pulse: 67  Resp: 18  Temp: 97.7 F (36.5 C)  SpO2: 99%   KPS: 90. General: Alert, cooperative, pleasant, in no acute distress Head: Normal EENT: No conjunctival injection or scleral icterus.  Lungs: Resp effort normal Cardiac: Regular rate Abdomen: Non-distended abdomen Skin: No rashes cyanosis or petechiae. Extremities: No clubbing or edema  Neurologic Exam: Mental Status: Awake, alert, attentive to examiner. Oriented  to self and environment. Language is fluent with intact comprehension.  Cranial Nerves: Visual acuity is grossly normal. Visual fields are full. Extra-ocular movements intact. No ptosis. Face is symmetric Motor: Tone and bulk are normal. Power is full in both arms and legs. Reflexes are symmetric, no pathologic reflexes present.  Sensory: Intact to light touch Gait: Normal.   Labs: I have reviewed the data as listed    Component Value Date/Time   NA 142 02/09/2020 0631   K 3.2 (L) 02/09/2020 0631   CL 102 02/09/2020 0631   CO2 28 02/09/2020 0631   GLUCOSE 100 (H) 02/09/2020 0631   BUN 17 02/09/2020 0631   CREATININE 0.70 02/09/2020 0631   CALCIUM 9.0 02/09/2020 0631   PROT 6.0 (L) 02/04/2020 1046   ALBUMIN 3.3 (L) 02/04/2020 1046   AST 21 02/04/2020 1046   ALT 23 02/04/2020 1046   ALKPHOS 57 02/04/2020 1046   BILITOT 0.8 02/04/2020 1046   GFRNONAA >60 02/09/2020 0631   GFRAA >60 02/09/2020 0631   Lab Results  Component Value Date   WBC 5.4 03/18/2020   NEUTROABS 3.6 03/18/2020   HGB 12.7 03/18/2020   HCT 39.4 03/18/2020   MCV 92.3 03/18/2020   PLT 303 03/18/2020    Assessment/Plan High grade glioma not classifiable by WHO criteria ([place]) [C71.9]  Alicia Mcdonald is clinically and radiographically stable today, now having completed two weeks of IMRT and Temozolomide.    We recommended continuing with 6 weeks intensity modulated radiation therapy and concurrent daily Temozolomide at 27m/m2.    Chemotherapy should be held for the following:  ANC less than 1,000  Platelets less than 100,000  LFT or creatinine greater than 2x ULN  If clinical concerns/contraindications develop  For seizures, she will continue Keppra 5033mBID.   We recommended she return to clinic during weeks 4 and 6 with labs for evaluation.  All questions were answered. The patient knows to call the clinic with any problems, questions or concerns. No barriers to learning were detected.  The  total time spent in the encounter was 30 minutes and more than 50% was on counseling and review of test results   ZaVentura SellersMD Medical Director of Neuro-Oncology [facility]7/06/21 9:51 AM

## 2020-03-19 ENCOUNTER — Other Ambulatory Visit: Payer: Self-pay

## 2020-03-19 ENCOUNTER — Ambulatory Visit
Admission: RE | Admit: 2020-03-19 | Discharge: 2020-03-19 | Disposition: A | Discharge status: Home / Self Care | Payer: BC Managed Care – PPO | Source: Ambulatory Visit | Attending: Radiation Oncology | Admitting: Radiation Oncology

## 2020-03-19 DIAGNOSIS — C719 Malignant neoplasm of brain, unspecified: Secondary | ICD-10-CM | POA: Diagnosis not present

## 2020-03-20 ENCOUNTER — Telehealth: Payer: Self-pay | Admitting: Internal Medicine

## 2020-03-20 ENCOUNTER — Ambulatory Visit
Admission: RE | Admit: 2020-03-20 | Discharge: 2020-03-20 | Disposition: A | Discharge status: Home / Self Care | Payer: BC Managed Care – PPO | Source: Ambulatory Visit | Attending: Radiation Oncology | Admitting: Radiation Oncology

## 2020-03-20 DIAGNOSIS — C719 Malignant neoplasm of brain, unspecified: Secondary | ICD-10-CM | POA: Diagnosis not present

## 2020-03-20 NOTE — Telephone Encounter (Signed)
No 7/6 los 

## 2020-03-21 ENCOUNTER — Ambulatory Visit
Admission: RE | Admit: 2020-03-21 | Discharge: 2020-03-21 | Disposition: A | Discharge status: Home / Self Care | Payer: BC Managed Care – PPO | Source: Ambulatory Visit | Attending: Radiation Oncology | Admitting: Radiation Oncology

## 2020-03-21 ENCOUNTER — Other Ambulatory Visit: Payer: Self-pay

## 2020-03-21 DIAGNOSIS — C719 Malignant neoplasm of brain, unspecified: Secondary | ICD-10-CM | POA: Diagnosis not present

## 2020-03-23 ENCOUNTER — Ambulatory Visit: Admission: RE | Admit: 2020-03-23 | Payer: BC Managed Care – PPO | Source: Ambulatory Visit

## 2020-03-24 ENCOUNTER — Other Ambulatory Visit: Payer: Self-pay

## 2020-03-24 ENCOUNTER — Ambulatory Visit
Admission: RE | Admit: 2020-03-24 | Discharge: 2020-03-24 | Disposition: A | Discharge status: Home / Self Care | Payer: BC Managed Care – PPO | Source: Ambulatory Visit | Attending: Radiation Oncology | Admitting: Radiation Oncology

## 2020-03-24 DIAGNOSIS — C719 Malignant neoplasm of brain, unspecified: Secondary | ICD-10-CM | POA: Diagnosis not present

## 2020-03-25 ENCOUNTER — Ambulatory Visit
Admission: RE | Admit: 2020-03-25 | Discharge: 2020-03-25 | Disposition: A | Discharge status: Home / Self Care | Payer: BC Managed Care – PPO | Source: Ambulatory Visit | Attending: Radiation Oncology | Admitting: Radiation Oncology

## 2020-03-25 DIAGNOSIS — C719 Malignant neoplasm of brain, unspecified: Secondary | ICD-10-CM | POA: Diagnosis not present

## 2020-03-26 ENCOUNTER — Ambulatory Visit
Admission: RE | Admit: 2020-03-26 | Discharge: 2020-03-26 | Disposition: A | Discharge status: Home / Self Care | Payer: BC Managed Care – PPO | Source: Ambulatory Visit | Attending: Radiation Oncology | Admitting: Radiation Oncology

## 2020-03-26 ENCOUNTER — Other Ambulatory Visit: Payer: Self-pay

## 2020-03-26 DIAGNOSIS — C719 Malignant neoplasm of brain, unspecified: Secondary | ICD-10-CM | POA: Diagnosis not present

## 2020-03-27 ENCOUNTER — Other Ambulatory Visit: Payer: Self-pay

## 2020-03-27 ENCOUNTER — Ambulatory Visit
Admission: RE | Admit: 2020-03-27 | Discharge: 2020-03-27 | Disposition: A | Discharge status: Home / Self Care | Payer: BC Managed Care – PPO | Source: Ambulatory Visit | Attending: Radiation Oncology | Admitting: Radiation Oncology

## 2020-03-27 DIAGNOSIS — C719 Malignant neoplasm of brain, unspecified: Secondary | ICD-10-CM | POA: Diagnosis not present

## 2020-03-28 ENCOUNTER — Ambulatory Visit
Admission: RE | Admit: 2020-03-28 | Discharge: 2020-03-28 | Disposition: A | Discharge status: Home / Self Care | Payer: BC Managed Care – PPO | Source: Ambulatory Visit | Attending: Radiation Oncology | Admitting: Radiation Oncology

## 2020-03-28 ENCOUNTER — Other Ambulatory Visit: Payer: Self-pay

## 2020-03-28 DIAGNOSIS — C719 Malignant neoplasm of brain, unspecified: Secondary | ICD-10-CM | POA: Diagnosis not present

## 2020-03-31 ENCOUNTER — Other Ambulatory Visit: Payer: Self-pay

## 2020-03-31 ENCOUNTER — Ambulatory Visit
Admission: RE | Admit: 2020-03-31 | Discharge: 2020-03-31 | Disposition: A | Discharge status: Home / Self Care | Payer: BC Managed Care – PPO | Source: Ambulatory Visit | Attending: Radiation Oncology | Admitting: Radiation Oncology

## 2020-03-31 DIAGNOSIS — C719 Malignant neoplasm of brain, unspecified: Secondary | ICD-10-CM | POA: Diagnosis not present

## 2020-04-01 ENCOUNTER — Other Ambulatory Visit: Payer: Self-pay

## 2020-04-01 ENCOUNTER — Inpatient Hospital Stay: Payer: BC Managed Care – PPO

## 2020-04-01 ENCOUNTER — Inpatient Hospital Stay (HOSPITAL_BASED_OUTPATIENT_CLINIC_OR_DEPARTMENT_OTHER): Payer: BC Managed Care – PPO | Admitting: Internal Medicine

## 2020-04-01 ENCOUNTER — Ambulatory Visit
Admission: RE | Admit: 2020-04-01 | Discharge: 2020-04-01 | Disposition: A | Discharge status: Home / Self Care | Payer: BC Managed Care – PPO | Source: Ambulatory Visit | Attending: Radiation Oncology | Admitting: Radiation Oncology

## 2020-04-01 VITALS — BP 135/75 | HR 82 | Temp 99.1°F | Resp 16 | Wt 150.4 lb

## 2020-04-01 DIAGNOSIS — C719 Malignant neoplasm of brain, unspecified: Secondary | ICD-10-CM

## 2020-04-01 DIAGNOSIS — R569 Unspecified convulsions: Secondary | ICD-10-CM

## 2020-04-01 DIAGNOSIS — C711 Malignant neoplasm of frontal lobe: Secondary | ICD-10-CM | POA: Diagnosis not present

## 2020-04-01 LAB — CMP (CANCER CENTER ONLY)
ALT: 17 U/L (ref 0–44)
AST: 16 U/L (ref 15–41)
Albumin: 3.6 g/dL (ref 3.5–5.0)
Alkaline Phosphatase: 67 U/L (ref 38–126)
Anion gap: 10 (ref 5–15)
BUN: 14 mg/dL (ref 8–23)
CO2: 29 mmol/L (ref 22–32)
Calcium: 9.4 mg/dL (ref 8.9–10.3)
Chloride: 103 mmol/L (ref 98–111)
Creatinine: 0.8 mg/dL (ref 0.44–1.00)
GFR, Est AFR Am: >60 mL/min (ref >60)
GFR, Estimated: >60 mL/min (ref >60)
Glucose, Bld: 117 mg/dL — ABNORMAL HIGH (ref 70–99)
Potassium: 3.7 mmol/L (ref 3.5–5.1)
Sodium: 142 mmol/L (ref 135–145)
Total Bilirubin: 0.5 mg/dL (ref 0.3–1.2)
Total Protein: 6.8 g/dL (ref 6.5–8.1)

## 2020-04-01 LAB — CBC WITH DIFFERENTIAL (CANCER CENTER ONLY)
Abs Immature Granulocytes: 0.02 10*3/uL (ref 0.00–0.07)
Basophils Absolute: 0 10*3/uL (ref 0.0–0.1)
Basophils Relative: 1 %
Eosinophils Absolute: 0.1 10*3/uL (ref 0.0–0.5)
Eosinophils Relative: 3 %
HCT: 39.3 % (ref 36.0–46.0)
Hemoglobin: 12.6 g/dL (ref 12.0–15.0)
Immature Granulocytes: 0 %
Lymphocytes Relative: 19 %
Lymphs Abs: 0.9 10*3/uL (ref 0.7–4.0)
MCH: 29.9 pg (ref 26.0–34.0)
MCHC: 32.1 g/dL (ref 30.0–36.0)
MCV: 93.1 fL (ref 80.0–100.0)
Monocytes Absolute: 0.4 10*3/uL (ref 0.1–1.0)
Monocytes Relative: 9 %
Neutro Abs: 3.2 10*3/uL (ref 1.7–7.7)
Neutrophils Relative %: 68 %
Platelet Count: 253 10*3/uL (ref 150–400)
RBC: 4.22 MIL/uL (ref 3.87–5.11)
RDW: 13.4 % (ref 11.5–15.5)
WBC Count: 4.7 10*3/uL (ref 4.0–10.5)
nRBC: 0 % (ref 0.0–0.2)

## 2020-04-01 NOTE — Progress Notes (Signed)
[facility] at [place], [place] [address] [phone]   Interval Evaluation  Date of Service: 04/01/20 Patient Name: Alicia Mcdonald Patient MRN: [id number] Patient DOB: Apr 17, 1956 Provider: Ventura Sellers, MD  Identifying Statement:  Alicia Mcdonald is a 64 y.o. female with bifrontal, callosal high grade glioma   Oncologic History: Oncology History  High grade glioma not classifiable by WHO criteria ([place])  02/06/2020 Surgery   Stereotactic biopsy by Dr. Marcello Moores   02/26/2020 -  Chemotherapy   The patient had temozolomide (TEMODAR) 20 MG capsule, 20 mg (100 % of original dose 20 mg), Oral, Daily, 1 of 1 cycle, Start date: 02/26/2020, End date: -- Dose modification: 20 mg (original dose 20 mg, Cycle 1) temozolomide (TEMODAR) 100 MG capsule, 100 mg (100 % of original dose 100 mg), Oral, Daily, 1 of 1 cycle, Start date: 02/26/2020, End date: -- Dose modification: 100 mg (original dose 100 mg, Cycle 1)  for chemotherapy treatment.      Biomarkers:  MGMT Unknown.  IDH 1/2 Wild type.  EGFR Unknown  TERT Unknown   Interval History:  [name] presents today for follow up, now having completed four weeks of radiation and concurrent Temodar.  Her brother is with her today.  She describes modest improvement in fatigue and energy issues.  Sleeping and napping a bit less than two weeks prior.  Still ambulating on her own without aid.  No further seizures.  Nausea well controlled.  H+P (02/25/20) Patient presented to medical attention in May 2021 with sudden onset confusion and disorientation.  She was found by her mother in this state after being seen normal shortly prior.  Apparently had a clinical seizure on the way to ED or in the ED.  CNS imaging demonstrated enhancing mass within corpus callosum and fornix, extending into frontal lobes bilaterally.  She underwent biopsy and ventriculostomy placement on 5/26 with Dr. Marcello Moores; path was  sent out to Dr. Maisie Fus at [facility] and demonstrated ungradable IDH-1 wild type glioma.  By several days post-op she had returned to baseline funtional status, fully intact without complaint.  Today she denies any neurologic deficits aside from some memory impairment.    Medications: Current Outpatient Medications on File Prior to Visit  Medication Sig Dispense Refill  . blood glucose meter kit and supplies KIT Check blood sugars three times a day before meals and use insulin as instructed 1 each 0  . cholecalciferol (VITAMIN D) 1000 UNITS tablet Take 1,000 Units by mouth daily.    . insulin aspart (NOVOLOG) 100 UNIT/ML FlexPen Inject 1-9 Units into the skin 3 (three) times daily with meals. CBG 70 - 120: 0 units ,CBG 121 - 150: 0 unit ,CBG 151 - 200: 2 units ,CBG 201 - 250: 3 units, CBG 251 - 300: 5 units, CBG 301 - 350: 7 units, CBG 351 - 400: 9 units CBG > 400: call your doctor. 3 mL 11  . Insulin Pen Needle 31G X 5 MM MISC 1 each by Does not apply route 3 (three) times daily before meals. 100 each 0  . Lancets (ONETOUCH DELICA PLUS [id number]) MISC USE TO TEST BLOOD SUGARAUP TO 4 TIMES DAILY.    Marland Kitchen levETIRAcetam (KEPPRA) 500 MG tablet Take 1 tablet (500 mg total) by mouth 2 (two) times daily. 60 tablet 2  . ondansetron (ZOFRAN) 8 MG tablet Take 1 tablet (8 mg total) by mouth 2 (two) times daily as needed (  nausea and vomiting). May take 30-60 minutes prior to Temodar administration if nausea/vomiting occurs. 30 tablet 1  . ONETOUCH ULTRA test strip USE TO TEST BLOOD SUGAR_UP TO 4 TIMES DAILY._    . temozolomide (TEMODAR) 100 MG capsule Take 1 capsule (100 mg total) by mouth daily. May take on an empty stomach to decrease nausea & vomiting. 42 capsule 0  . temozolomide (TEMODAR) 20 MG capsule Take 1 capsule (20 mg total) by mouth daily. May take on an empty stomach to decrease nausea & vomiting. 42 capsule 0  . pantoprazole (PROTONIX) 40 MG tablet Take 1 tablet (40 mg total) by mouth at bedtime. 30  tablet 0   No current facility-administered medications on file prior to visit.    Allergies:  Allergies  Allergen Reactions  . Tylenol [Acetaminophen] Other (See Comments)    Blood in urine with excessive use   Past Medical History:  Past Medical History:  Diagnosis Date  . Cancer ([place])   . Iron deficiency   . Migraine   . Vitamin D deficiency    Past Surgical History:  Past Surgical History:  Procedure Laterality Date  . APPLICATION OF CRANIAL NAVIGATION N/A 02/06/2020   Procedure: APPLICATION OF CRANIAL NAVIGATION;  Surgeon: Vallarie Mare, MD;  Location: [facility];  Service: Neurosurgery;  Laterality: N/A;  . COLONOSCOPY N/A 04/24/2013   Procedure: COLONOSCOPY;  Surgeon: Danie Binder, MD;  [facility];  Service: Endoscopy;  Laterality: N/A;  1:45  . FRAMELESS  BIOPSY WITH BRAINLAB N/A 02/06/2020   Procedure: FRAMELESS BIOPSY WITH Lucky Rathke;  Surgeon: Vallarie Mare, MD;  Location: [facility];  Service: Neurosurgery;  Laterality: N/A;  . [place]  . VENTRICULOSTOMY N/A 02/06/2020   Procedure: VENTRICULOSTOMY AND ENDOSCOPIC SEPTOSTOMY;  Surgeon: Vallarie Mare, MD;  Location: [facility];  Service: Neurosurgery;  Laterality: N/A;   Social History:  Social History   Socioeconomic History  . Marital status: Single    Spouse name: Not on file  . Number of children: Not on file  . Years of education: Not on file  . Highest education level: Not on file  Occupational History  . 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 Smoking status: Never Smoker  . Smokeless tobacco: Never Used  Substance and Sexual Activity  . Alcohol use: Yes    Comment: once a month, rare  . Drug use: No  . Sexual activity: Not on file  Other Topics Concern  . Not on file  Social History Narrative  . Not on file   Social Determinants of Health   Financial Resource Strain:   .  Difficulty of Paying Living Expenses:   Food Insecurity:   . Worried About Charity fundraiser in the Last Year:   . Arboriculturist in the Last Year:   Transportation Needs:   . Film/video editor (Medical):   Marland Kitchen Lack of Transportation (Non-Medical):   Physical Activity:   . Days of Exercise per Week:   . Minutes of Exercise per Session:   Stress:   . Feeling of Stress :   Social Connections:   . Frequency of Communication with Friends and Family:   . Frequency of Social Gatherings with Friends and Family:   . Attends Religious Services:   . Active Member of Clubs or Organizations:   . Attends Archivist Meetings:   .  Marital Status:   Intimate Partner Violence:   . Fear of Current or Ex-Partner:   . Emotionally Abused:   Marland Kitchen Physically Abused:   . Sexually Abused:    Family History:  Family History  Problem Relation Age of Onset  . Colon cancer Neg Hx     Review of Systems: Constitutional: Doesn't report fevers, chills or abnormal weight loss Eyes: Doesn't report blurriness of vision Ears, nose, mouth, throat, and face: Doesn't report sore throat Respiratory: Doesn't report cough, dyspnea or wheezes Cardiovascular: Doesn't report palpitation, chest discomfort  Gastrointestinal:  Doesn't report nausea, constipation, diarrhea GU: Doesn't report incontinence Skin: Doesn't report skin rashes Neurological: Per HPI Musculoskeletal: Doesn't report joint pain Behavioral/Psych: Doesn't report anxiety  Physical Exam: Vitals:   04/01/20 1016  BP: 135/75  Pulse: 82  Resp: 16  Temp: 99.1 F (37.3 C)  SpO2: 99%   KPS: 90. General: Alert, cooperative, pleasant, in no acute distress Head: Normal EENT: No conjunctival injection or scleral icterus.  Lungs: Resp effort normal Cardiac: Regular rate Abdomen: Non-distended abdomen Skin: No rashes cyanosis or petechiae. Extremities: No clubbing or edema  Neurologic Exam: Mental Status: Awake, alert, attentive to  examiner. Oriented to self and environment. Language is fluent with intact comprehension.  Cranial Nerves: Visual acuity is grossly normal. Visual fields are full. Extra-ocular movements intact. No ptosis. Face is symmetric Motor: Tone and bulk are normal. Power is full in both arms and legs. Reflexes are symmetric, no pathologic reflexes present.  Sensory: Intact to light touch Gait: Normal.   Labs: I have reviewed the data as listed    Component Value Date/Time   NA 142 04/01/2020 1003   K 3.7 04/01/2020 1003   CL 103 04/01/2020 1003   CO2 29 04/01/2020 1003   GLUCOSE 117 (H) 04/01/2020 1003   BUN 14 04/01/2020 1003   CREATININE 0.80 04/01/2020 1003   CALCIUM 9.4 04/01/2020 1003   PROT 6.8 04/01/2020 1003   ALBUMIN 3.6 04/01/2020 1003   AST 16 04/01/2020 1003   ALT 17 04/01/2020 1003   ALKPHOS 67 04/01/2020 1003   BILITOT 0.5 04/01/2020 1003   GFRNONAA >60 04/01/2020 1003   GFRAA >60 04/01/2020 1003   Lab Results  Component Value Date   WBC 4.7 04/01/2020   NEUTROABS 3.2 04/01/2020   HGB 12.6 04/01/2020   HCT 39.3 04/01/2020   MCV 93.1 04/01/2020   PLT 253 04/01/2020    Assessment/Plan High grade glioma not classifiable by WHO criteria ([place]) [C71.9]  NARCISSUS DETWILER is clinically stable today, now having completed 4 weeks of IMRT and Temozolomide.  Labs are normal today. We are encouraged by her activity level and overall functional status.  We recommended continuing with 6 weeks intensity modulated radiation therapy and concurrent daily Temozolomide at 22m/m2.    Chemotherapy should be held for the following:  ANC less than 1,000  Platelets less than 100,000  LFT or creatinine greater than 2x ULN  If clinical concerns/contraindications develop  For seizures, she will continue Keppra 5054mBID.   We recommended she return to clinic during final week of radiation with labs for evaluation.  All questions were answered. The patient knows to call the clinic  with any problems, questions or concerns. No barriers to learning were detected.  I have spent a total of 30 minutes of face-to-face and non-face-to-face time, excluding clinical staff time, preparing to see patient, ordering tests and/or medications, counseling the patient, and independently interpreting results and communicating results to  the patient/family/caregiver    Ventura Sellers, MD Medical Director of Neuro-Oncology [facility] at [place] 04/01/20 11:32 AM

## 2020-04-02 ENCOUNTER — Other Ambulatory Visit: Payer: Self-pay

## 2020-04-02 ENCOUNTER — Ambulatory Visit
Admission: RE | Admit: 2020-04-02 | Discharge: 2020-04-02 | Disposition: A | Discharge status: Home / Self Care | Payer: BC Managed Care – PPO | Source: Ambulatory Visit | Attending: Radiation Oncology | Admitting: Radiation Oncology

## 2020-04-02 DIAGNOSIS — C719 Malignant neoplasm of brain, unspecified: Secondary | ICD-10-CM | POA: Diagnosis not present

## 2020-04-02 MED FILL — TEMOZOLOMIDE 100 MG CAPS: 100 | 14 days supply | Qty: 14 | Fill #1

## 2020-04-02 MED FILL — TEMOZOLOMIDE 20 MG CAPS: 20 | 14 days supply | Qty: 14 | Fill #1

## 2020-04-03 ENCOUNTER — Ambulatory Visit
Admission: RE | Admit: 2020-04-03 | Discharge: 2020-04-03 | Disposition: A | Discharge status: Home / Self Care | Payer: BC Managed Care – PPO | Source: Ambulatory Visit | Attending: Radiation Oncology | Admitting: Radiation Oncology

## 2020-04-03 ENCOUNTER — Other Ambulatory Visit: Payer: Self-pay

## 2020-04-03 DIAGNOSIS — C719 Malignant neoplasm of brain, unspecified: Secondary | ICD-10-CM | POA: Diagnosis not present

## 2020-04-04 ENCOUNTER — Ambulatory Visit
Admission: RE | Admit: 2020-04-04 | Discharge: 2020-04-04 | Disposition: A | Discharge status: Home / Self Care | Payer: BC Managed Care – PPO | Source: Ambulatory Visit | Attending: Radiation Oncology | Admitting: Radiation Oncology

## 2020-04-04 ENCOUNTER — Telehealth: Payer: Self-pay | Admitting: Internal Medicine

## 2020-04-04 DIAGNOSIS — C719 Malignant neoplasm of brain, unspecified: Secondary | ICD-10-CM | POA: Diagnosis not present

## 2020-04-04 NOTE — Telephone Encounter (Signed)
No 7/20 LOS

## 2020-04-07 ENCOUNTER — Ambulatory Visit
Admission: RE | Admit: 2020-04-07 | Discharge: 2020-04-07 | Disposition: A | Discharge status: Home / Self Care | Payer: BC Managed Care – PPO | Source: Ambulatory Visit | Attending: Radiation Oncology | Admitting: Radiation Oncology

## 2020-04-07 ENCOUNTER — Other Ambulatory Visit: Payer: Self-pay

## 2020-04-07 DIAGNOSIS — C719 Malignant neoplasm of brain, unspecified: Secondary | ICD-10-CM | POA: Diagnosis not present

## 2020-04-08 ENCOUNTER — Other Ambulatory Visit: Payer: Self-pay

## 2020-04-08 ENCOUNTER — Ambulatory Visit
Admission: RE | Admit: 2020-04-08 | Discharge: 2020-04-08 | Disposition: A | Discharge status: Home / Self Care | Payer: BC Managed Care – PPO | Source: Ambulatory Visit | Attending: Radiation Oncology | Admitting: Radiation Oncology

## 2020-04-08 DIAGNOSIS — C719 Malignant neoplasm of brain, unspecified: Secondary | ICD-10-CM | POA: Diagnosis not present

## 2020-04-09 ENCOUNTER — Ambulatory Visit
Admission: RE | Admit: 2020-04-09 | Discharge: 2020-04-09 | Disposition: A | Discharge status: Home / Self Care | Payer: BC Managed Care – PPO | Source: Ambulatory Visit | Attending: Radiation Oncology | Admitting: Radiation Oncology

## 2020-04-09 ENCOUNTER — Other Ambulatory Visit: Payer: Self-pay

## 2020-04-09 DIAGNOSIS — C719 Malignant neoplasm of brain, unspecified: Secondary | ICD-10-CM | POA: Diagnosis not present

## 2020-04-10 ENCOUNTER — Ambulatory Visit
Admission: RE | Admit: 2020-04-10 | Discharge: 2020-04-10 | Disposition: A | Discharge status: Home / Self Care | Payer: BC Managed Care – PPO | Source: Ambulatory Visit | Attending: Radiation Oncology | Admitting: Radiation Oncology

## 2020-04-10 ENCOUNTER — Other Ambulatory Visit: Payer: Self-pay

## 2020-04-10 DIAGNOSIS — C719 Malignant neoplasm of brain, unspecified: Secondary | ICD-10-CM | POA: Diagnosis not present

## 2020-04-11 ENCOUNTER — Ambulatory Visit
Admission: RE | Admit: 2020-04-11 | Discharge: 2020-04-11 | Disposition: A | Discharge status: Home / Self Care | Payer: BC Managed Care – PPO | Source: Ambulatory Visit | Attending: Radiation Oncology | Admitting: Radiation Oncology

## 2020-04-11 ENCOUNTER — Other Ambulatory Visit: Payer: Self-pay

## 2020-04-11 DIAGNOSIS — C719 Malignant neoplasm of brain, unspecified: Secondary | ICD-10-CM | POA: Diagnosis not present

## 2020-04-14 ENCOUNTER — Ambulatory Visit
Admission: RE | Admit: 2020-04-14 | Discharge: 2020-04-14 | Disposition: A | Discharge status: Home / Self Care | Payer: BC Managed Care – PPO | Source: Ambulatory Visit | Attending: Radiation Oncology | Admitting: Radiation Oncology

## 2020-04-14 ENCOUNTER — Other Ambulatory Visit: Payer: Self-pay

## 2020-04-14 DIAGNOSIS — C719 Malignant neoplasm of brain, unspecified: Secondary | ICD-10-CM | POA: Insufficient documentation

## 2020-04-14 DIAGNOSIS — Z510 Encounter for antineoplastic radiation therapy: Secondary | ICD-10-CM | POA: Diagnosis present

## 2020-04-15 ENCOUNTER — Ambulatory Visit
Admission: RE | Admit: 2020-04-15 | Discharge: 2020-04-15 | Disposition: A | Discharge status: Home / Self Care | Payer: BC Managed Care – PPO | Source: Ambulatory Visit | Attending: Radiation Oncology | Admitting: Radiation Oncology

## 2020-04-15 ENCOUNTER — Inpatient Hospital Stay: Payer: BC Managed Care – PPO

## 2020-04-15 ENCOUNTER — Other Ambulatory Visit: Payer: Self-pay

## 2020-04-15 ENCOUNTER — Inpatient Hospital Stay: Payer: BC Managed Care – PPO | Attending: Internal Medicine | Admitting: Internal Medicine

## 2020-04-15 VITALS — BP 115/75 | HR 80 | Temp 97.6°F | Resp 18 | Ht 67.0 in | Wt 150.2 lb

## 2020-04-15 DIAGNOSIS — Z9221 Personal history of antineoplastic chemotherapy: Secondary | ICD-10-CM | POA: Insufficient documentation

## 2020-04-15 DIAGNOSIS — C719 Malignant neoplasm of brain, unspecified: Secondary | ICD-10-CM

## 2020-04-15 DIAGNOSIS — R569 Unspecified convulsions: Secondary | ICD-10-CM

## 2020-04-15 DIAGNOSIS — Z85820 Personal history of malignant melanoma of skin: Secondary | ICD-10-CM | POA: Diagnosis not present

## 2020-04-15 DIAGNOSIS — C711 Malignant neoplasm of frontal lobe: Secondary | ICD-10-CM | POA: Insufficient documentation

## 2020-04-15 DIAGNOSIS — Z79899 Other long term (current) drug therapy: Secondary | ICD-10-CM | POA: Diagnosis not present

## 2020-04-15 DIAGNOSIS — Z923 Personal history of irradiation: Secondary | ICD-10-CM | POA: Diagnosis not present

## 2020-04-15 LAB — CMP (CANCER CENTER ONLY)
ALT: 17 U/L (ref 0–44)
AST: 16 U/L (ref 15–41)
Albumin: 3.9 g/dL (ref 3.5–5.0)
Alkaline Phosphatase: 69 U/L (ref 38–126)
Anion gap: 8 (ref 5–15)
BUN: 13 mg/dL (ref 8–23)
CO2: 29 mmol/L (ref 22–32)
Calcium: 9.7 mg/dL (ref 8.9–10.3)
Chloride: 101 mmol/L (ref 98–111)
Creatinine: 0.8 mg/dL (ref 0.44–1.00)
GFR, Est AFR Am: >60 mL/min (ref >60)
GFR, Estimated: >60 mL/min (ref >60)
Glucose, Bld: 124 mg/dL — ABNORMAL HIGH (ref 70–99)
Potassium: 4 mmol/L (ref 3.5–5.1)
Sodium: 138 mmol/L (ref 135–145)
Total Bilirubin: 0.3 mg/dL (ref 0.3–1.2)
Total Protein: 6.8 g/dL (ref 6.5–8.1)

## 2020-04-15 LAB — CBC WITH DIFFERENTIAL (CANCER CENTER ONLY)
Abs Immature Granulocytes: 0.01 10*3/uL (ref 0.00–0.07)
Basophils Absolute: 0 10*3/uL (ref 0.0–0.1)
Basophils Relative: 0 %
Eosinophils Absolute: 0.1 10*3/uL (ref 0.0–0.5)
Eosinophils Relative: 2 %
HCT: 40.5 % (ref 36.0–46.0)
Hemoglobin: 13.1 g/dL (ref 12.0–15.0)
Immature Granulocytes: 0 %
Lymphocytes Relative: 14 %
Lymphs Abs: 0.7 10*3/uL (ref 0.7–4.0)
MCH: 30 pg (ref 26.0–34.0)
MCHC: 32.3 g/dL (ref 30.0–36.0)
MCV: 92.9 fL (ref 80.0–100.0)
Monocytes Absolute: 0.6 10*3/uL (ref 0.1–1.0)
Monocytes Relative: 11 %
Neutro Abs: 3.8 10*3/uL (ref 1.7–7.7)
Neutrophils Relative %: 73 %
Platelet Count: 259 10*3/uL (ref 150–400)
RBC: 4.36 MIL/uL (ref 3.87–5.11)
RDW: 13.4 % (ref 11.5–15.5)
WBC Count: 5.2 10*3/uL (ref 4.0–10.5)
nRBC: 0 % (ref 0.0–0.2)

## 2020-04-15 NOTE — Progress Notes (Signed)
[facility] at [facility], [place] [address] [phone]   Interval Evaluation  Date of Service: 04/15/20 Patient Name: Alicia Mcdonald Patient MRN: [id number] Patient DOB: 1956-05-23 Provider: Ventura Sellers, MD  Identifying Statement:  Alicia Mcdonald is a 64 y.o. female with bifrontal, callosal high grade glioma   Oncologic History: Oncology History  High grade glioma not classifiable by WHO criteria ([place])  02/06/2020 Surgery   Stereotactic biopsy by Dr. Marcello Moores   02/26/2020 -  Chemotherapy   The patient had temozolomide (TEMODAR) 20 MG capsule, 20 mg (100 % of original dose 20 mg), Oral, Daily, 1 of 1 cycle, Start date: 02/26/2020, End date: -- Dose modification: 20 mg (original dose 20 mg, Cycle 1) temozolomide (TEMODAR) 100 MG capsule, 100 mg (100 % of original dose 100 mg), Oral, Daily, 1 of 1 cycle, Start date: 02/26/2020, End date: -- Dose modification: 100 mg (original dose 100 mg, Cycle 1)  for chemotherapy treatment.      Biomarkers:  MGMT Unknown.  IDH 1/2 Wild type.  EGFR Unknown  TERT Unknown   Interval History:  Alicia Mcdonald presents today for follow up, now in final week of radiation and concurrent Temodar. She is accompanied by a friend today. Fatigue and energy have been stable. Still ambulating independently.  No further seizures.  Nausea well controlled.  H+P (02/25/20) Patient presented to medical attention in May 2021 with sudden onset confusion and disorientation.  She was found by her mother in this state after being seen normal shortly prior.  Apparently had a clinical seizure on the way to ED or in the ED.  CNS imaging demonstrated enhancing mass within corpus callosum and fornix, extending into frontal lobes bilaterally.  She underwent biopsy and ventriculostomy placement on 5/26 with Dr. Marcello Moores; path was sent out to Dr. Maisie Fus at [facility] and demonstrated ungradable IDH-1 wild type glioma.  By several  days post-op she had returned to baseline funtional status, fully intact without complaint.  Today she denies any neurologic deficits aside from some memory impairment.    Medications: Current Outpatient Medications on File Prior to Visit  Medication Sig Dispense Refill  . blood glucose meter kit and supplies KIT Check blood sugars three times a day before meals and use insulin as instructed 1 each 0  . cholecalciferol (VITAMIN D) 1000 UNITS tablet Take 1,000 Units by mouth daily.    . insulin aspart (NOVOLOG) 100 UNIT/ML FlexPen Inject 1-9 Units into the skin 3 (three) times daily with meals. CBG 70 - 120: 0 units ,CBG 121 - 150: 0 unit ,CBG 151 - 200: 2 units ,CBG 201 - 250: 3 units, CBG 251 - 300: 5 units, CBG 301 - 350: 7 units, CBG 351 - 400: 9 units CBG > 400: call your doctor. 3 mL 11  . Insulin Pen Needle 31G X 5 MM MISC 1 each by Does not apply route 3 (three) times daily before meals. 100 each 0  . Lancets (ONETOUCH DELICA PLUS [id number]) MISC USE TO TEST BLOOD SUGARAUP TO 4 TIMES DAILY.    Marland Kitchen levETIRAcetam (KEPPRA) 500 MG tablet Take 1 tablet (500 mg total) by mouth 2 (two) times daily. 60 tablet 2  . ondansetron (ZOFRAN) 8 MG tablet Take 1 tablet (8 mg total) by mouth 2 (two) times daily as needed (nausea and vomiting). May take 30-60 minutes prior to Temodar administration if nausea/vomiting occurs. 30 tablet 1  . ONETOUCH ULTRA  test strip USE TO TEST BLOOD SUGAR_UP TO 4 TIMES DAILY._    . temozolomide (TEMODAR) 100 MG capsule Take 1 capsule (100 mg total) by mouth daily. May take on an empty stomach to decrease nausea & vomiting. 42 capsule 0  . temozolomide (TEMODAR) 20 MG capsule Take 1 capsule (20 mg total) by mouth daily. May take on an empty stomach to decrease nausea & vomiting. 42 capsule 0  . pantoprazole (PROTONIX) 40 MG tablet Take 1 tablet (40 mg total) by mouth at bedtime. 30 tablet 0   No current facility-administered medications on file prior to visit.    Allergies:    Allergies  Allergen Reactions  . Tylenol [Acetaminophen] Other (See Comments)    Blood in urine with excessive use   Past Medical History:  Past Medical History:  Diagnosis Date  . Cancer ([place])   . Iron deficiency   . Migraine   . Vitamin D deficiency    Past Surgical History:  Past Surgical History:  Procedure Laterality Date  . APPLICATION OF CRANIAL NAVIGATION N/A 02/06/2020   Procedure: APPLICATION OF CRANIAL NAVIGATION;  Surgeon: Vallarie Mare, MD;  Location: [place];  Service: Neurosurgery;  Laterality: N/A;  . COLONOSCOPY N/A 04/24/2013   Procedure: COLONOSCOPY;  Surgeon: Danie Binder, MD;  [facility];  Service: Endoscopy;  Laterality: N/A;  1:45  . FRAMELESS  BIOPSY WITH BRAINLAB N/A 02/06/2020   Procedure: FRAMELESS BIOPSY WITH Lucky Rathke;  Surgeon: Vallarie Mare, MD;  Location: [place];  Service: Neurosurgery;  Laterality: N/A;  . [place]  . VENTRICULOSTOMY N/A 02/06/2020   Procedure: VENTRICULOSTOMY AND ENDOSCOPIC SEPTOSTOMY;  Surgeon: Vallarie Mare, MD;  Location: [place];  Service: Neurosurgery;  Laterality: N/A;   Social History:  Social History   Socioeconomic History  . Marital status: Single    Spouse name: Not on file  . Number of children: Not on file  . Years of education: Not on file  . Highest education level: Not on file  Occupational History  . 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 Smoking status: Never Smoker  . Smokeless tobacco: Never Used  Substance and Sexual Activity  . Alcohol use: Yes    Comment: once a month, rare  . Drug use: No  . Sexual activity: Not on file  Other Topics Concern  . Not on file  Social History Narrative  . Not on file   Social Determinants of Health   Financial Resource Strain:   . Difficulty of Paying Living Expenses:   Food Insecurity:   . Worried About Charity fundraiser in the  Last Year:   . Arboriculturist in the Last Year:   Transportation Needs:   . Film/video editor (Medical):   Marland Kitchen Lack of Transportation (Non-Medical):   Physical Activity:   . Days of Exercise per Week:   . Minutes of Exercise per Session:   Stress:   . Feeling of Stress :   Social Connections:   . Frequency of Communication with Friends and Family:   . Frequency of Social Gatherings with Friends and Family:   . Attends Religious Services:   . Active Member of Clubs or Organizations:   . Attends Archivist Meetings:   Marland Kitchen Marital Status:   Intimate Partner Violence:   . Fear of Current or Ex-Partner:   .  Emotionally Abused:   Marland Kitchen Physically Abused:   . Sexually Abused:    Family History:  Family History  Problem Relation Age of Onset  . Colon cancer Neg Hx     Review of Systems: Constitutional: Doesn't report fevers, chills or abnormal weight loss Eyes: Doesn't report blurriness of vision Ears, nose, mouth, throat, and face: Doesn't report sore throat Respiratory: Doesn't report cough, dyspnea or wheezes Cardiovascular: Doesn't report palpitation, chest discomfort  Gastrointestinal:  Doesn't report nausea, constipation, diarrhea GU: Doesn't report incontinence Skin: Doesn't report skin rashes Neurological: Per HPI Musculoskeletal: Doesn't report joint pain Behavioral/Psych: Doesn't report anxiety  Physical Exam: Vitals:   04/15/20 1021  BP: 115/75  Pulse: 80  Resp: 18  Temp: 97.6 F (36.4 C)  SpO2: 100%   KPS: 90. General: Alert, cooperative, pleasant, in no acute distress Head: Normal EENT: No conjunctival injection or scleral icterus.  Lungs: Resp effort normal Cardiac: Regular rate Abdomen: Non-distended abdomen Skin: No rashes cyanosis or petechiae. Extremities: No clubbing or edema  Neurologic Exam: Mental Status: Awake, alert, attentive to examiner. Oriented to self and environment. Language is fluent with intact comprehension.  Cranial  Nerves: Visual acuity is grossly normal. Visual fields are full. Extra-ocular movements intact. No ptosis. Face is symmetric Motor: Tone and bulk are normal. Power is full in both arms and legs. Reflexes are symmetric, no pathologic reflexes present.  Sensory: Intact to light touch Gait: Normal.   Labs: I have reviewed the data as listed    Component Value Date/Time   NA 142 04/01/2020 1003   K 3.7 04/01/2020 1003   CL 103 04/01/2020 1003   CO2 29 04/01/2020 1003   GLUCOSE 117 (H) 04/01/2020 1003   BUN 14 04/01/2020 1003   CREATININE 0.80 04/01/2020 1003   CALCIUM 9.4 04/01/2020 1003   PROT 6.8 04/01/2020 1003   ALBUMIN 3.6 04/01/2020 1003   AST 16 04/01/2020 1003   ALT 17 04/01/2020 1003   ALKPHOS 67 04/01/2020 1003   BILITOT 0.5 04/01/2020 1003   GFRNONAA >60 04/01/2020 1003   GFRAA >60 04/01/2020 1003   Lab Results  Component Value Date   WBC 5.2 04/15/2020   NEUTROABS 3.8 04/15/2020   HGB 13.1 04/15/2020   HCT 40.5 04/15/2020   MCV 92.9 04/15/2020   PLT 259 04/15/2020    Assessment/Plan High grade glioma not classifiable by WHO criteria ([place]) [C71.9]  Alicia Mcdonald is clinically stable today, now in final week of IMRT and Temozolomide.  Functional independence and cognitive function remain very good.  She will finalize the 6 weeks intensity modulated radiation therapy and concurrent daily Temozolomide at 97m/m2.    Chemotherapy should be held for the following:  ANC less than 1,000  Platelets less than 100,000  LFT or creatinine greater than 2x ULN  If clinical concerns/contraindications develop  For seizures, she will continue Keppra 5079mBID.   We ask that Alicia BELLMOREeturn to clinic in 1 months following post radiation brain MRI, or sooner as needed.  All questions were answered. The patient knows to call the clinic with any problems, questions or concerns. No barriers to learning were detected.  I have spent a total of 30 minutes of  face-to-face and non-face-to-face time, excluding clinical staff time, preparing to see patient, ordering tests and/or medications, counseling the patient, and independently interpreting results and communicating results to the patient/family/caregiver    ZaVentura SellersMD Medical Director of Neuro-Oncology [facility]  04/15/20 10:34 AM

## 2020-04-16 ENCOUNTER — Telehealth: Payer: Self-pay | Admitting: Internal Medicine

## 2020-04-16 ENCOUNTER — Other Ambulatory Visit: Payer: Self-pay

## 2020-04-16 ENCOUNTER — Ambulatory Visit
Admission: RE | Admit: 2020-04-16 | Discharge: 2020-04-16 | Disposition: A | Discharge status: Home / Self Care | Payer: BC Managed Care – PPO | Source: Ambulatory Visit | Attending: Radiation Oncology | Admitting: Radiation Oncology

## 2020-04-16 DIAGNOSIS — C719 Malignant neoplasm of brain, unspecified: Secondary | ICD-10-CM | POA: Diagnosis not present

## 2020-04-16 NOTE — Telephone Encounter (Signed)
Scheduled per 8/3 los. Unable to reach pt. Left voicemail with appt time and date.

## 2020-04-17 ENCOUNTER — Ambulatory Visit
Admission: RE | Admit: 2020-04-17 | Discharge: 2020-04-17 | Disposition: A | Discharge status: Home / Self Care | Payer: BC Managed Care – PPO | Source: Ambulatory Visit | Attending: Radiation Oncology | Admitting: Radiation Oncology

## 2020-04-17 ENCOUNTER — Other Ambulatory Visit: Payer: Self-pay

## 2020-04-17 DIAGNOSIS — C719 Malignant neoplasm of brain, unspecified: Secondary | ICD-10-CM | POA: Diagnosis not present

## 2020-04-18 ENCOUNTER — Ambulatory Visit
Admission: RE | Admit: 2020-04-18 | Discharge: 2020-04-18 | Disposition: A | Discharge status: Home / Self Care | Payer: BC Managed Care – PPO | Source: Ambulatory Visit | Attending: Radiation Oncology | Admitting: Radiation Oncology

## 2020-04-18 ENCOUNTER — Other Ambulatory Visit: Payer: Self-pay

## 2020-04-18 DIAGNOSIS — C719 Malignant neoplasm of brain, unspecified: Secondary | ICD-10-CM | POA: Diagnosis not present

## 2020-04-21 ENCOUNTER — Encounter: Payer: Self-pay | Admitting: Radiation Oncology

## 2020-04-21 ENCOUNTER — Ambulatory Visit: Payer: BC Managed Care – PPO

## 2020-04-21 ENCOUNTER — Ambulatory Visit
Admission: RE | Admit: 2020-04-21 | Discharge: 2020-04-21 | Disposition: A | Discharge status: Home / Self Care | Payer: BC Managed Care – PPO | Source: Ambulatory Visit | Attending: Radiation Oncology | Admitting: Radiation Oncology

## 2020-04-21 ENCOUNTER — Other Ambulatory Visit: Payer: Self-pay

## 2020-04-21 DIAGNOSIS — C719 Malignant neoplasm of brain, unspecified: Secondary | ICD-10-CM | POA: Diagnosis not present

## 2020-04-22 ENCOUNTER — Ambulatory Visit: Payer: BC Managed Care – PPO

## 2020-04-23 ENCOUNTER — Ambulatory Visit: Payer: BC Managed Care – PPO

## 2020-04-24 ENCOUNTER — Ambulatory Visit: Payer: BC Managed Care – PPO

## 2020-04-25 ENCOUNTER — Ambulatory Visit: Payer: BC Managed Care – PPO

## 2020-04-28 ENCOUNTER — Ambulatory Visit: Payer: BC Managed Care – PPO

## 2020-04-29 ENCOUNTER — Ambulatory Visit: Payer: BC Managed Care – PPO

## 2020-04-30 ENCOUNTER — Ambulatory Visit: Payer: BC Managed Care – PPO

## 2020-05-05 NOTE — Progress Notes (Signed)
  Radiation Oncology         [phone] ________________________________  Name: Alicia Mcdonald MRN: [id number]  Date: 04/21/2020  DOB: [birth year]  End of Treatment Note  Diagnosis:   high-grade glioma     Indication for treatment::  curative       Radiation treatment dates:   03/10/20 - 04/21/20  Site/dose:   The patient was treated to the target region and areas of edema initially to a dose of 46 Gy using a IMRT technique.  The patient then received a 14 Gy boost to yield a final dose of 60 Gy.  Narrative: The patient tolerated radiation treatment relatively well.     Plan: The patient has completed radiation treatment. The patient will return to radiation oncology clinic for routine followup in one month. I advised the patient to call or return sooner if they have any questions or concerns related to their recovery or treatment. ________________________________  Jodelle Gross, M.D., Ph.D.

## 2020-05-06 ENCOUNTER — Other Ambulatory Visit: Payer: Self-pay | Admitting: Radiation Therapy

## 2020-05-16 ENCOUNTER — Other Ambulatory Visit: Payer: Self-pay

## 2020-05-16 ENCOUNTER — Ambulatory Visit (HOSPITAL_COMMUNITY)
Admission: RE | Admit: 2020-05-16 | Discharge: 2020-05-16 | Disposition: A | Discharge status: Home / Self Care | Payer: BC Managed Care – PPO | Source: Ambulatory Visit | Attending: Internal Medicine | Admitting: Internal Medicine

## 2020-05-16 DIAGNOSIS — C719 Malignant neoplasm of brain, unspecified: Secondary | ICD-10-CM | POA: Diagnosis not present

## 2020-05-16 MED ORDER — GADOBUTROL 1 MMOL/ML IV SOLN
8.0000 mL | Freq: Once | INTRAVENOUS | Status: AC | PRN
  Administered 2020-05-16: 6 mL via INTRAVENOUS

## 2020-05-20 ENCOUNTER — Inpatient Hospital Stay: Payer: BC Managed Care – PPO | Attending: Internal Medicine | Admitting: Internal Medicine

## 2020-05-20 ENCOUNTER — Other Ambulatory Visit: Payer: Self-pay

## 2020-05-20 ENCOUNTER — Telehealth: Payer: Self-pay

## 2020-05-20 ENCOUNTER — Telehealth: Payer: Self-pay | Admitting: Pharmacist

## 2020-05-20 ENCOUNTER — Other Ambulatory Visit: Payer: Self-pay | Admitting: Internal Medicine

## 2020-05-20 VITALS — BP 137/74 | HR 76 | Temp 96.7°F | Resp 18 | Ht 67.0 in | Wt 148.1 lb

## 2020-05-20 DIAGNOSIS — Z923 Personal history of irradiation: Secondary | ICD-10-CM | POA: Insufficient documentation

## 2020-05-20 DIAGNOSIS — C711 Malignant neoplasm of frontal lobe: Secondary | ICD-10-CM | POA: Diagnosis not present

## 2020-05-20 DIAGNOSIS — C719 Malignant neoplasm of brain, unspecified: Secondary | ICD-10-CM | POA: Diagnosis not present

## 2020-05-20 DIAGNOSIS — R569 Unspecified convulsions: Secondary | ICD-10-CM

## 2020-05-20 DIAGNOSIS — Z79899 Other long term (current) drug therapy: Secondary | ICD-10-CM | POA: Insufficient documentation

## 2020-05-20 DIAGNOSIS — R112 Nausea with vomiting, unspecified: Secondary | ICD-10-CM | POA: Diagnosis not present

## 2020-05-20 DIAGNOSIS — Z9221 Personal history of antineoplastic chemotherapy: Secondary | ICD-10-CM | POA: Diagnosis not present

## 2020-05-20 MED ORDER — TEMOZOLOMIDE 20 MG PO CAPS: 20.0000 mg | ORAL_CAPSULE | Freq: Every day | ORAL | 0 refills | Status: DC

## 2020-05-20 MED ORDER — TEMOZOLOMIDE 140 MG PO CAPS: 140.0000 mg | ORAL_CAPSULE | Freq: Every day | ORAL | 0 refills | Status: DC

## 2020-05-20 MED ORDER — LEVETIRACETAM 500 MG PO TABS
500.0000 mg | ORAL_TABLET | Freq: Two times a day (BID) | ORAL | 5 refills | Status: DC
Start: 2020-05-20 — End: 2020-12-30

## 2020-05-20 MED ORDER — TEMOZOLOMIDE 100 MG PO CAPS: 100.0000 mg | ORAL_CAPSULE | Freq: Every day | ORAL | 0 refills | Status: DC

## 2020-05-20 MED ORDER — ONDANSETRON HCL 8 MG PO TABS: 8.0000 mg | ORAL_TABLET | Freq: Two times a day (BID) | ORAL | 1 refills | Status: DC | PRN

## 2020-05-20 MED FILL — ONDANSETRON HCL 8 MG TABLET: 8 | 21 days supply | Qty: 18 | Fill #0

## 2020-05-20 NOTE — Telephone Encounter (Signed)
Oral Oncology Patient Advocate Encounter  After completing a benefits investigation, prior authorization for Temozolomide is not required at this time through [employer].  Patient's copay is $0/ea.  [place] Patient [place] Phone [phone] Fax [phone] 05/20/2020 4:18 PM

## 2020-05-20 NOTE — Progress Notes (Signed)
[place] at [place], [place] [address] [phone]   Interval Evaluation  Date of Service: 05/20/20 Patient Name: Alicia Mcdonald Patient MRN: [id number] Patient DOB: [birth year] Provider: Ventura Sellers, MD  Identifying Statement:  Alicia Mcdonald is a 64 y.o. female with bifrontal, callosal high grade glioma   Oncologic History: Oncology History  High grade glioma not classifiable by WHO criteria ([place])  02/06/2020 Surgery   Stereotactic biopsy by Dr. Marcello Moores   02/26/2020 -  Chemotherapy   The patient had temozolomide (TEMODAR) 20 MG capsule, 20 mg (100 % of original dose 20 mg), Oral, Daily, 1 of 1 cycle, Start date: 02/26/2020, End date: -- Dose modification: 20 mg (original dose 20 mg, Cycle 1) temozolomide (TEMODAR) 100 MG capsule, 100 mg (100 % of original dose 100 mg), Oral, Daily, 1 of 1 cycle, Start date: 02/26/2020, End date: -- Dose modification: 100 mg (original dose 100 mg, Cycle 1)  for chemotherapy treatment.      Biomarkers:  MGMT Unknown.  IDH 1/2 Wild type.  EGFR Unknown  TERT Unknown   Interval History:  Alicia Mcdonald presents today for follow up, now having completed radiation and concurrent Temodar.  No new or progressive neurologic deficits. Energy level has improved since completing RT. Still ambulating independently.  No further seizures, out of Keppra.   H+P (02/25/20) Patient presented to medical attention in May 2021 with sudden onset confusion and disorientation.  She was found by her mother in this state after being seen normal shortly prior.  Apparently had a clinical seizure on the way to ED or in the ED.  CNS imaging demonstrated enhancing mass within corpus callosum and fornix, extending into frontal lobes bilaterally.  She underwent biopsy and ventriculostomy placement on 5/26 with Dr. Marcello Moores; path was sent out to Dr. Maisie Fus at [facility] and demonstrated ungradable IDH-1 wild type glioma.  By  several days post-op she had returned to baseline funtional status, fully intact without complaint.  Today she denies any neurologic deficits aside from some memory impairment.    Medications: Current Outpatient Medications on File Prior to Visit  Medication Sig Dispense Refill  . cholecalciferol (VITAMIN D) 1000 UNITS tablet Take 1,000 Units by mouth daily.    Marland Kitchen levETIRAcetam (KEPPRA) 500 MG tablet Take 1 tablet (500 mg total) by mouth 2 (two) times daily. 60 tablet 2  . ondansetron (ZOFRAN) 8 MG tablet Take 1 tablet (8 mg total) by mouth 2 (two) times daily as needed (nausea and vomiting). May take 30-60 minutes prior to Temodar administration if nausea/vomiting occurs. (Patient not taking: Reported on 05/20/2020) 30 tablet 1  . pantoprazole (PROTONIX) 40 MG tablet Take 1 tablet (40 mg total) by mouth at bedtime. 30 tablet 0  . temozolomide (TEMODAR) 100 MG capsule Take 1 capsule (100 mg total) by mouth daily. May take on an empty stomach to decrease nausea & vomiting. (Patient not taking: Reported on 05/20/2020) 42 capsule 0  . temozolomide (TEMODAR) 20 MG capsule Take 1 capsule (20 mg total) by mouth daily. May take on an empty stomach to decrease nausea & vomiting. (Patient not taking: Reported on 05/20/2020) 42 capsule 0   No current facility-administered medications on file prior to visit.    Allergies:  Allergies  Allergen Reactions  . Tylenol [Acetaminophen] Other (See Comments)    Blood in urine with excessive use   Past Medical History:  Past Medical History:  Diagnosis Date  .  Cancer ([place])   . Iron deficiency   . Migraine   . Vitamin D deficiency    Past Surgical History:  Past Surgical History:  Procedure Laterality Date  . APPLICATION OF CRANIAL NAVIGATION N/A 02/06/2020   Procedure: APPLICATION OF CRANIAL NAVIGATION;  Surgeon: Vallarie Mare, MD;  Location: [place];  Service: Neurosurgery;  Laterality: N/A;  . COLONOSCOPY N/A 04/24/2013   Procedure: COLONOSCOPY;  Surgeon:  Danie Binder, MD;  [facility];  Service: Endoscopy;  Laterality: N/A;  1:45  . FRAMELESS  BIOPSY WITH BRAINLAB N/A 02/06/2020   Procedure: FRAMELESS BIOPSY WITH Lucky Rathke;  Surgeon: Vallarie Mare, MD;  Location: [place];  Service: Neurosurgery;  Laterality: N/A;  . [place]  . VENTRICULOSTOMY N/A 02/06/2020   Procedure: VENTRICULOSTOMY AND ENDOSCOPIC SEPTOSTOMY;  Surgeon: Vallarie Mare, MD;  Location: [place];  Service: Neurosurgery;  Laterality: N/A;   Social History:  Social History   Socioeconomic History  . Marital status: Single    Spouse name: Not on file  . Number of children: Not on file  . Years of education: Not on file  . Highest education level: Not on file  Occupational History  . 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 Smoking status: Never Smoker  . Smokeless tobacco: Never Used  Substance and Sexual Activity  . Alcohol use: Yes    Comment: once a month, rare  . Drug use: No  . Sexual activity: Not on file  Other Topics Concern  . Not on file  Social History Narrative  . Not on file   Social Determinants of Health   Financial Resource Strain:   . Difficulty of Paying Living Expenses: Not on file  Food Insecurity:   . Worried About Charity fundraiser in the Last Year: Not on file  . Ran Out of Food in the Last Year: Not on file  Transportation Needs:   . Lack of Transportation (Medical): Not on file  . Lack of Transportation (Non-Medical): Not on file  Physical Activity:   . Days of Exercise per Week: Not on file  . Minutes of Exercise per Session: Not on file  Stress:   . Feeling of Stress : Not on file  Social Connections:   . Frequency of Communication with Friends and Family: Not on file  . Frequency of Social Gatherings with Friends and Family: Not on file  . Attends Religious Services: Not on file  . Active Member of Clubs  or Organizations: Not on file  . Attends Archivist Meetings: Not on file  . Marital Status: Not on file  Intimate Partner Violence:   . Fear of Current or Ex-Partner: Not on file  . Emotionally Abused: Not on file  . Physically Abused: Not on file  . Sexually Abused: Not on file   Family History:  Family History  Problem Relation Age of Onset  . Colon cancer Neg Hx     Review of Systems: Constitutional: Doesn't report fevers, chills or abnormal weight loss Eyes: Doesn't report blurriness of vision Ears, nose, mouth, throat, and face: Doesn't report sore throat Respiratory: Doesn't report cough, dyspnea or wheezes Cardiovascular: Doesn't report palpitation, chest discomfort  Gastrointestinal:  Doesn't report nausea, constipation, diarrhea GU: Doesn't report incontinence Skin: Doesn't report skin rashes Neurological: Per HPI Musculoskeletal: Doesn't report joint pain Behavioral/Psych: Doesn't report anxiety  Physical Exam: Vitals:   05/20/20 1124  BP: 137/74  Pulse: 76  Resp: 18  Temp: (!) 96.7 F (35.9 C)  SpO2: 100%   KPS: 90. General: Alert, cooperative, pleasant, in no acute distress Head: Normal EENT: No conjunctival injection or scleral icterus.  Lungs: Resp effort normal Cardiac: Regular rate Abdomen: Non-distended abdomen Skin: No rashes cyanosis or petechiae. Extremities: No clubbing or edema  Neurologic Exam: Mental Status: Awake, alert, attentive to examiner. Oriented to self and environment. Language is fluent with intact comprehension.  Cranial Nerves: Visual acuity is grossly normal. Visual fields are full. Extra-ocular movements intact. No ptosis. Face is symmetric Motor: Tone and bulk are normal. Power is full in both arms and legs. Reflexes are symmetric, no pathologic reflexes present.  Sensory: Intact to light touch Gait: Normal.   Labs: I have reviewed the data as listed    Component Value Date/Time   NA 138 04/15/2020 1006   K  4.0 04/15/2020 1006   CL 101 04/15/2020 1006   CO2 29 04/15/2020 1006   GLUCOSE 124 (H) 04/15/2020 1006   BUN 13 04/15/2020 1006   CREATININE 0.80 04/15/2020 1006   CALCIUM 9.7 04/15/2020 1006   PROT 6.8 04/15/2020 1006   ALBUMIN 3.9 04/15/2020 1006   AST 16 04/15/2020 1006   ALT 17 04/15/2020 1006   ALKPHOS 69 04/15/2020 1006   BILITOT 0.3 04/15/2020 1006   GFRNONAA >60 04/15/2020 1006   GFRAA >60 04/15/2020 1006   Lab Results  Component Value Date   WBC 5.2 04/15/2020   NEUTROABS 3.8 04/15/2020   HGB 13.1 04/15/2020   HCT 40.5 04/15/2020   MCV 92.9 04/15/2020   PLT 259 04/15/2020   Imaging:  [facility] Clinician Interpretation: I have personally reviewed the CNS images as listed.  My interpretation, in the context of the patient's clinical presentation, is stable disease  MR BRAIN W WO CONTRAST  Result Date: 05/17/2020 CLINICAL DATA:  High-grade glioma.  Treatment follow-up. EXAM: MRI HEAD WITHOUT AND WITH CONTRAST TECHNIQUE: Multiplanar, multiecho pulse sequences of the brain and surrounding structures were obtained without and with intravenous contrast. CONTRAST:  9m GADAVIST GADOBUTROL 1 MMOL/ML IV SOLN COMPARISON:  02/04/2020. FINDINGS: Brain: No acute infarct, acute hemorrhage or extra-axial collection. Heterogeneously contrast-enhancing mass of the anterior corpus callosum extending along the septum pellucidum to the fornix measures 4.1 x 3.5 x 2.1 cm, previously 4.1 x 3.6 x 3.2 cm. Hyperintense T2-weighted signal extending into the left frontal white matter is unchanged. There is now a instrumentation tract from a right frontal approach, reflecting recent septostomy and ventriculostomy. Decreased hydrocephalus. Small amount of blood products and enhancement along instrumentation tract. 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 There are no remote contrast-enhancing lesions. Vascular: Normal flow voids. Skull and upper cervical spine: Normal marrow signal. Sinuses/Orbits:  Negative. Other: None. IMPRESSION: 1. Unchanged size of glioma of the genu of the corpus callosum extending along the septum pellucidum to the fornix. 2. Status post ventriculostomy/septostomy with decreased hydrocephalus. Electronically Signed   By: KUlyses JarredM.D.   On: 05/17/2020 01:39    Assessment/Plan High grade glioma not classifiable by WHO criteria ([place] [C71.9]  FMAKENZIE WEISNERis clinically and radiographically stable today, now having completed IMRT and Temozolomide.  Functional independence and cognitive function remain very good.  We recommended initiating treatment with Temozolomide 1584mm2, on for five days and off for twenty three days in twenty eight day cycles. The patient will have a complete blood count performed on  days 21 and 28 of each cycle, and a comprehensive metabolic panel performed on day 28 of each cycle. Labs may need to be performed more often. Zofran will prescribed for home use for nausea/vomiting.  We reviewed side effects of Temodar including nausea/vomiting, cytopenias, constipation, fatigue.  Chemotherapy should be held for the following:  ANC less than 1,000  Platelets less than 100,000  LFT or creatinine greater than 2x ULN  If clinical concerns/contraindications develop  For seizures, she will continue Keppra 575m BID.   We ask that Alicia SRAMEKreturn to clinic in 1 months with labs for evaluation prior to cycle #2, or sooner as needed.  All questions were answered. The patient knows to call the clinic with any problems, questions or concerns. No barriers to learning were detected.  I have spent a total of 40 minutes of face-to-face and non-face-to-face time, excluding clinical staff time, preparing to see patient, ordering tests and/or medications, counseling the patient, and independently interpreting results and communicating results to the patient/family/caregiver    ZVentura Sellers MD Medical Director of Neuro-Oncology [facility]at [place]09/07/21 11:32 AM

## 2020-05-20 NOTE — Progress Notes (Signed)
DISCONTINUE ON PATHWAY REGIMEN - Neuro     One cycle, concurrent with RT:     Temozolomide   **Always confirm dose/schedule in your pharmacy ordering system**  REASON: Continuation Of Treatment PRIOR TREATMENT: [id number]: Radiation Therapy with Concurrent Temozolomide 75 mg/m2 Daily x 6 Weeks, Followed by Sequential Temozolomide TREATMENT RESPONSE: Stable Disease (SD)  START ON PATHWAY REGIMEN - Neuro     A cycle is every 28 days:     Temozolomide      Temozolomide   **Always confirm dose/schedule in your pharmacy ordering system**  Patient Characteristics: Glioblastoma (Grade IV Glioma), Newly Diagnosed / Treatment Naive, Good Performance Status and/or Younger Patient, MGMT Promoter Unmethylated/Unknown Disease Classification: Glioma Disease Classification: Glioblastoma (Grade IV Glioma) Disease Status: Newly Diagnosed / Treatment Naive Performance Status: Good Performance Status and/or Younger Patient MGMT Promoter Methylation Status: Awaiting Test Results Intent of Therapy: Non-Curative / Palliative Intent, Discussed with Patient 

## 2020-05-20 NOTE — Telephone Encounter (Signed)
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  Prescription dose and frequency assessed for appropriateness. Appropriate for therapy initiation.   CMP and CBC w/ Diff from 04/15/2020 assessed, no relevant lab abnormalities noted.  Current medication list in Epic reviewed, no relevant/significant DDIs with Temodar identified.  Evaluated chart and no patient barriers to medication adherence noted.   Prescription has been e-scribed to the [facility] for benefits analysis and approval.  Oral Oncology Clinic will continue to follow for insurance authorization, copayment issues, initial counseling and start date.  Leron Croak, PharmD, BCPS Hematology/Oncology Clinical Pharmacist [facility] [phone] 05/20/2020 4:17 PM

## 2020-05-21 ENCOUNTER — Telehealth: Payer: Self-pay | Admitting: Internal Medicine

## 2020-05-21 NOTE — Telephone Encounter (Signed)
Oral Chemotherapy Pharmacist Encounter   Attempted to reach patient to provide update and offer for initial counseling on oral medication: Temodar (temozolomide).  No answer.  Left voicemail for patient to call back to discuss details of medication acquisition and initial counseling session.  Leron Croak, PharmD, BCPS Hematology/Oncology Clinical Pharmacist [facility] [phone] 05/21/2020 12:01 PM

## 2020-05-21 NOTE — Telephone Encounter (Signed)
Scheduled per 9/7 los. Unable to reach pt. Left voicemail with appt time an date.

## 2020-05-22 MED FILL — TEMOZOLOMIDE 20 MG CAPS: 20 | 5 days supply | Qty: 5 | Fill #0

## 2020-05-22 MED FILL — TEMOZOLOMIDE 100 MG CAPS: 100 | 5 days supply | Qty: 5 | Fill #0

## 2020-05-22 MED FILL — TEMOZOLOMIDE 140 MG CAPS: 140 | 5 days supply | Qty: 5 | Fill #0

## 2020-05-22 NOTE — Telephone Encounter (Signed)
Oral Chemotherapy Pharmacist Encounter  I spoke with patient for overview of: Temodar (temozolomide) for the maintenance treatment of glioblastoma multiforme, planned duration 6-12 months of treatment.  Counseled on administration, dosing, side effects, monitoring, drug-food interactions, safe handling, storage, and disposal.  Patient will take one Temodar 140mg  capsule, one Temodar 100mg  capsule, and one Temodar 20mg  capsule (260mg  total daily dose), by mouth once daily, may take at bedtime and on an empty stomach to decrease nausea and vomiting.  If 1st cycle is well tolerated, Ms. Saline informed that Temodar dose may be increased to 200 mg/m2 daily for 5 days on, 23 days off, repeated every 28 days for subsequent cycles   Patient will take Temodar daily for 5 days on, 23 days off, and repeated.  Temodar start date: 05/26/2020   Patient will take Zofran 8mg  tablet, 1 tablet by mouth 30-60 min prior to Temodar dose to help decrease N/V.   Adverse effects include but are not limited to: nausea, vomiting, anorexia, GI upset, rash, drug fever, and fatigue. Rare but serious adverse effects of pneumocystis pneumonia and secondary malignancy also discussed.  We discussed strategies to manage constipation if they occur secondary to ondansetron dosing.  PCP prophylaxis will not be initiated at this time, but may be added based on lymphocyte count in the future.  Reviewed importance of keeping a medication schedule and plan for any missed doses. No barriers to medication adherence identified.  Medication reconciliation performed and medication/allergy list updated.  This will ship from the [facility] on 05/22/20 to deliver to patient's home on 05/23/20.  Patient informed the pharmacy will reach out 5-7 days prior to needing next fill of Temodar to coordinate continued medication acquisition to prevent break in therapy.  All questions answered.  Ms. Mcconathy voiced understanding  and appreciation.   Patient knows to call the office with questions or concerns.  Leron Croak, PharmD, BCPS Hematology/Oncology Clinical Pharmacist [facility] [phone] 05/22/2020 9:59 AM

## 2020-05-26 ENCOUNTER — Telehealth: Payer: Self-pay | Admitting: Radiation Oncology

## 2020-05-26 ENCOUNTER — Inpatient Hospital Stay: Payer: BC Managed Care – PPO

## 2020-05-26 NOTE — Telephone Encounter (Signed)
  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  Interval Since Last Radiation: 5 weeks   03/10/20 - 04/21/20: The patient was treated to the target region and areas of edema initially to a dose of 46 Gy using a IMRT technique.  The patient then received a 14 Gy boost to yield a final dose of 60 Gy.  Narrative:  The patient was contacted today for routine follow-up. During treatment she did very well with radiotherapy and did not have significant desquamation. She reports she is doing well. She had some patchy hair loss around the treatment site, and is hopeful to see this grow back. No other complaints are noted and she restarted temodar in cyclical dosing today.  Impression/Plan: 1. 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 We discussed that we would be happy to continue to follow her as needed, but she will also continue to follow up with Dr. Mickeal Skinner in neuro oncology.     Carola Rhine, PAC

## 2020-06-12 ENCOUNTER — Other Ambulatory Visit: Payer: Self-pay | Admitting: Internal Medicine

## 2020-06-12 DIAGNOSIS — C719 Malignant neoplasm of brain, unspecified: Secondary | ICD-10-CM

## 2020-06-23 ENCOUNTER — Other Ambulatory Visit: Payer: Self-pay

## 2020-06-23 ENCOUNTER — Inpatient Hospital Stay: Payer: BC Managed Care – PPO

## 2020-06-23 ENCOUNTER — Other Ambulatory Visit: Payer: Self-pay | Admitting: Internal Medicine

## 2020-06-23 ENCOUNTER — Inpatient Hospital Stay: Payer: BC Managed Care – PPO | Attending: Internal Medicine | Admitting: Internal Medicine

## 2020-06-23 VITALS — BP 122/69 | HR 68 | Temp 97.3°F | Resp 18 | Ht 67.0 in | Wt 147.7 lb

## 2020-06-23 DIAGNOSIS — Z79899 Other long term (current) drug therapy: Secondary | ICD-10-CM | POA: Insufficient documentation

## 2020-06-23 DIAGNOSIS — R569 Unspecified convulsions: Secondary | ICD-10-CM

## 2020-06-23 DIAGNOSIS — C711 Malignant neoplasm of frontal lobe: Secondary | ICD-10-CM | POA: Diagnosis present

## 2020-06-23 DIAGNOSIS — C719 Malignant neoplasm of brain, unspecified: Secondary | ICD-10-CM

## 2020-06-23 DIAGNOSIS — Z9221 Personal history of antineoplastic chemotherapy: Secondary | ICD-10-CM | POA: Insufficient documentation

## 2020-06-23 LAB — CBC WITH DIFFERENTIAL (CANCER CENTER ONLY)
Abs Immature Granulocytes: 0.01 10*3/uL (ref 0.00–0.07)
Basophils Absolute: 0 10*3/uL (ref 0.0–0.1)
Basophils Relative: 0 %
Eosinophils Absolute: 0.1 10*3/uL (ref 0.0–0.5)
Eosinophils Relative: 2 %
HCT: 39 % (ref 36.0–46.0)
Hemoglobin: 12.8 g/dL (ref 12.0–15.0)
Immature Granulocytes: 0 %
Lymphocytes Relative: 16 %
Lymphs Abs: 0.7 10*3/uL (ref 0.7–4.0)
MCH: 30.5 pg (ref 26.0–34.0)
MCHC: 32.8 g/dL (ref 30.0–36.0)
MCV: 93.1 fL (ref 80.0–100.0)
Monocytes Absolute: 0.4 10*3/uL (ref 0.1–1.0)
Monocytes Relative: 10 %
Neutro Abs: 2.8 10*3/uL (ref 1.7–7.7)
Neutrophils Relative %: 72 %
Platelet Count: 170 10*3/uL (ref 150–400)
RBC: 4.19 MIL/uL (ref 3.87–5.11)
RDW: 12.3 % (ref 11.5–15.5)
WBC Count: 4 10*3/uL (ref 4.0–10.5)
nRBC: 0 % (ref 0.0–0.2)

## 2020-06-23 LAB — CMP (CANCER CENTER ONLY)
ALT: 13 U/L (ref 0–44)
AST: 13 U/L — ABNORMAL LOW (ref 15–41)
Albumin: 3.6 g/dL (ref 3.5–5.0)
Alkaline Phosphatase: 65 U/L (ref 38–126)
Anion gap: 4 — ABNORMAL LOW (ref 5–15)
BUN: 11 mg/dL (ref 8–23)
CO2: 34 mmol/L — ABNORMAL HIGH (ref 22–32)
Calcium: 9.9 mg/dL (ref 8.9–10.3)
Chloride: 105 mmol/L (ref 98–111)
Creatinine: 0.75 mg/dL (ref 0.44–1.00)
GFR, Estimated: >60 mL/min (ref >60)
Glucose, Bld: 91 mg/dL (ref 70–99)
Potassium: 4.3 mmol/L (ref 3.5–5.1)
Sodium: 143 mmol/L (ref 135–145)
Total Bilirubin: 0.3 mg/dL (ref 0.3–1.2)
Total Protein: 6.9 g/dL (ref 6.5–8.1)

## 2020-06-23 MED ORDER — TEMOZOLOMIDE 100 MG PO CAPS: 100.0000 mg | ORAL_CAPSULE | Freq: Every day | ORAL | 0 refills | Status: DC

## 2020-06-23 MED ORDER — TEMOZOLOMIDE 250 MG PO CAPS: 250.0000 mg | ORAL_CAPSULE | Freq: Every day | ORAL | 0 refills | Status: DC

## 2020-06-23 NOTE — Progress Notes (Signed)
[facility] [place] [place], [place] [address] [phone]   Interval Evaluation  Date of Service: 06/23/20 Patient Name: Alicia Mcdonald Patient MRN: [id number] Patient DOB: 01-30-1956 Provider: Ventura Sellers, MD  Identifying Statement:  Alicia Mcdonald is a 64 y.o. female with bifrontal, callosal high grade glioma   Oncologic History: Oncology History  High grade glioma not classifiable by WHO criteria ([place])  02/06/2020 Surgery   Stereotactic biopsy by Dr. Marcello Moores   02/26/2020 - 02/26/2020 Chemotherapy   The patient had temozolomide (TEMODAR) 20 MG capsule, 20 mg (100 % of original dose 20 mg), Oral, Daily, 1 of 1 cycle, Start date: 02/26/2020, End date: 05/20/2020 Dose modification: 20 mg (original dose 20 mg, Cycle 1) temozolomide (TEMODAR) 100 MG capsule, 100 mg (100 % of original dose 100 mg), Oral, Daily, 1 of 1 cycle, Start date: 02/26/2020, End date: 05/20/2020 Dose modification: 100 mg (original dose 100 mg, Cycle 1)  for chemotherapy treatment.    05/20/2020 -  Chemotherapy   The patient had temozolomide (TEMODAR) 20 MG capsule, 20 mg (100 % of original dose 20 mg), Oral, Daily, 1 of 1 cycle, Start date: 05/20/2020, End date: -- Dose modification: 20 mg (original dose 20 mg, Cycle 1) temozolomide (TEMODAR) 100 MG capsule, 100 mg (100 % of original dose 100 mg), Oral, Daily, 1 of 1 cycle, Start date: 05/20/2020, End date: -- Dose modification: 100 mg (original dose 100 mg, Cycle 1) temozolomide (TEMODAR) 140 MG capsule, 140 mg (100 % of original dose 140 mg), Oral, Daily, 1 of 1 cycle, Start date: 05/20/2020, End date: -- Dose modification: 140 mg (original dose 140 mg, Cycle 1)  for chemotherapy treatment.      Biomarkers:  MGMT Unknown.  IDH 1/2 Wild type.  EGFR Unknown  TERT Unknown   Interval History:  Alicia Mcdonald presents today for follow up following 1st cycle of 5-day Temodar.  Tolerated chemo well without issue.  No  new or progressive neurologic deficits. Energy levels are stable. Still ambulating independently.  No further seizures, denies headaches.   H+P (02/25/20) Patient presented to medical attention in May 2021 with sudden onset confusion and disorientation.  She was found by her mother in this state after being seen normal shortly prior.  Apparently had a clinical seizure on the way to ED or in the ED.  CNS imaging demonstrated enhancing mass within corpus callosum and fornix, extending into frontal lobes bilaterally.  She underwent biopsy and ventriculostomy placement on 5/26 with Dr. Marcello Moores; path was sent out to Dr. Maisie Fus at [facility] and demonstrated ungradable IDH-1 wild type glioma.  By several days post-op she had returned to baseline funtional status, fully intact without complaint.  Today she denies any neurologic deficits aside from some memory impairment.    Medications: Current Outpatient Medications on File Prior to Visit  Medication Sig Dispense Refill  . cholecalciferol (VITAMIN D) 1000 UNITS tablet Take 1,000 Units by mouth daily.    Marland Kitchen levETIRAcetam (KEPPRA) 500 MG tablet Take 1 tablet (500 mg total) by mouth 2 (two) times daily. 60 tablet 5  . ondansetron (ZOFRAN) 8 MG tablet Take 1 tablet (8 mg total) by mouth 2 (two) times daily as needed (nausea and vomiting). May take 30-60 minutes prior to Temodar administration if nausea/vomiting occurs. 30 tablet 1  . pantoprazole (PROTONIX) 40 MG tablet Take 1 tablet (40 mg total) by mouth at bedtime. 30 tablet 0  .  temozolomide (TEMODAR) 100 MG capsule Take 1 capsule (100 mg total) by mouth daily. May take on an empty stomach to decrease nausea & vomiting. 5 capsule 0  . temozolomide (TEMODAR) 140 MG capsule Take 1 capsule (140 mg total) by mouth daily. May take on an empty stomach to decrease nausea & vomiting. 5 capsule 0  . temozolomide (TEMODAR) 20 MG capsule Take 1 capsule (20 mg total) by mouth daily. May take on an empty stomach to decrease  nausea & vomiting. 5 capsule 0   No current facility-administered medications on file prior to visit.    Allergies:  Allergies  Allergen Reactions  . Tylenol [Acetaminophen] Other (See Comments)    Blood in urine with excessive use   Past Medical History:  Past Medical History:  Diagnosis Date  . Cancer ([place])   . Iron deficiency   . Migraine   . Vitamin D deficiency    Past Surgical History:  Past Surgical History:  Procedure Laterality Date  . APPLICATION OF CRANIAL NAVIGATION N/A 02/06/2020   Procedure: APPLICATION OF CRANIAL NAVIGATION;  Surgeon: Vallarie Mare, MD;  Location: [facility];  Service: Neurosurgery;  Laterality: N/A;  . COLONOSCOPY N/A 04/24/2013   Procedure: COLONOSCOPY;  Surgeon: Danie Binder, MD;  [facility];  Service: Endoscopy;  Laterality: N/A;  1:45  . FRAMELESS  BIOPSY WITH BRAINLAB N/A 02/06/2020   Procedure: FRAMELESS BIOPSY WITH Lucky Rathke;  Surgeon: Vallarie Mare, MD;  Location: [facility];  Service: Neurosurgery;  Laterality: N/A;  . [place]  . VENTRICULOSTOMY N/A 02/06/2020   Procedure: VENTRICULOSTOMY AND ENDOSCOPIC SEPTOSTOMY;  Surgeon: Vallarie Mare, MD;  Location: [facility];  Service: Neurosurgery;  Laterality: N/A;   Social History:  Social History   Socioeconomic History  . Marital status: Single    Spouse name: Not on file  . Number of children: Not on file  . Years of education: Not on file  . Highest education level: Not on file  Occupational History  . 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 Smoking status: Never Smoker  . Smokeless tobacco: Never Used  Substance and Sexual Activity  . Alcohol use: Yes    Comment: once a month, rare  . Drug use: No  . Sexual activity: Not on file  Other Topics Concern  . Not on file  Social History Narrative  . Not on file   Social Determinants of Health   Financial  Resource Strain:   . Difficulty of Paying Living Expenses: Not on file  Food Insecurity:   . Worried About Charity fundraiser in the Last Year: Not on file  . Ran Out of Food in the Last Year: Not on file  Transportation Needs:   . Lack of Transportation (Medical): Not on file  . Lack of Transportation (Non-Medical): Not on file  Physical Activity:   . Days of Exercise per Week: Not on file  . Minutes of Exercise per Session: Not on file  Stress:   . Feeling of Stress : Not on file  Social Connections:   . Frequency of Communication with Friends and Family: Not on file  . Frequency of Social Gatherings with Friends and Family: Not on file  . Attends Religious Services: Not on file  . Active Member of Clubs or Organizations: Not on file  . Attends Archivist Meetings: Not on  file  . Marital Status: Not on file  Intimate Partner Violence:   . Fear of Current or Ex-Partner: Not on file  . Emotionally Abused: Not on file  . Physically Abused: Not on file  . Sexually Abused: Not on file   Family History:  Family History  Problem Relation Age of Onset  . Colon cancer Neg Hx     Review of Systems: Constitutional: Doesn't report fevers, chills or abnormal weight loss Eyes: Doesn't report blurriness of vision Ears, nose, mouth, throat, and face: Doesn't report sore throat Respiratory: Doesn't report cough, dyspnea or wheezes Cardiovascular: Doesn't report palpitation, chest discomfort  Gastrointestinal:  Doesn't report nausea, constipation, diarrhea GU: Doesn't report incontinence Skin: Doesn't report skin rashes Neurological: Per HPI Musculoskeletal: Doesn't report joint pain Behavioral/Psych: Doesn't report anxiety  Physical Exam: Vitals:   06/23/20 1123  BP: 122/69  Pulse: 68  Resp: 18  Temp: (!) 97.3 F (36.3 C)  SpO2: 100%   KPS: 90. General: Alert, cooperative, pleasant, in no acute distress Head: Normal EENT: No conjunctival injection or scleral  icterus.  Lungs: Resp effort normal Cardiac: Regular rate Abdomen: Non-distended abdomen Skin: No rashes cyanosis or petechiae. Extremities: No clubbing or edema  Neurologic Exam: Mental Status: Awake, alert, attentive to examiner. Oriented to self and environment. Language is fluent with intact comprehension.  Cranial Nerves: Visual acuity is grossly normal. Visual fields are full. Extra-ocular movements intact. No ptosis. Face is symmetric Motor: Tone and bulk are normal. Power is full in both arms and legs. Reflexes are symmetric, no pathologic reflexes present.  Sensory: Intact to light touch Gait: Normal.   Labs: I have reviewed the data as listed    Component Value Date/Time   NA 138 04/15/2020 1006   K 4.0 04/15/2020 1006   CL 101 04/15/2020 1006   CO2 29 04/15/2020 1006   GLUCOSE 124 (H) 04/15/2020 1006   BUN 13 04/15/2020 1006   CREATININE 0.80 04/15/2020 1006   CALCIUM 9.7 04/15/2020 1006   PROT 6.8 04/15/2020 1006   ALBUMIN 3.9 04/15/2020 1006   AST 16 04/15/2020 1006   ALT 17 04/15/2020 1006   ALKPHOS 69 04/15/2020 1006   BILITOT 0.3 04/15/2020 1006   GFRNONAA >60 04/15/2020 1006   GFRAA >60 04/15/2020 1006   Lab Results  Component Value Date   WBC 4.0 06/23/2020   NEUTROABS 2.8 06/23/2020   HGB 12.8 06/23/2020   HCT 39.0 06/23/2020   MCV 93.1 06/23/2020   PLT 170 06/23/2020    Assessment/Plan High grade glioma not classifiable by WHO criteria ([place]) [C71.9]  Alicia Mcdonald is clinically stable today, now having completed 1st cycle of 5-day Temozolomide.    We recommended continuing treatment with cycle #2 Temozolomide, dose increased to 247m/m2, on for five days and off for twenty three days in twenty eight day cycles. The patient will have a complete blood count performed on days 21 and 28 of each cycle, and a comprehensive metabolic panel performed on day 28 of each cycle. Labs may need to be performed more often. Zofran will prescribed for home use  for nausea/vomiting.    Chemotherapy should be held for the following:  ANC less than 1,000  Platelets less than 100,000  LFT or creatinine greater than 2x ULN  If clinical concerns/contraindications develop  For seizures, she will continue Keppra 5068mBID.   We ask that Alicia LEATHERSeturn to clinic in 1 months with MRI brain evaluation prior to cycle #3, or  sooner as needed.  All questions were answered. The patient knows to call the clinic with any problems, questions or concerns. No barriers to learning were detected.  I have spent a total of 30 minutes of face-to-face and non-face-to-face time, excluding clinical staff time, preparing to see patient, ordering tests and/or medications, counseling the patient, and independently interpreting results and communicating results to the patient/family/caregiver    Ventura Sellers, MD Medical Director of Neuro-Oncology [facility] 06/23/20 11:25 AM

## 2020-06-24 ENCOUNTER — Telehealth: Payer: Self-pay | Admitting: Internal Medicine

## 2020-06-24 NOTE — Telephone Encounter (Signed)
Scheduled per 10/11 los. Unable to reach pt. Left voicemail with appt times and date.

## 2020-06-27 MED FILL — TEMOZOLOMIDE 250 MG CAPS: 250 | 5 days supply | Qty: 5 | Fill #0

## 2020-06-27 MED FILL — TEMOZOLOMIDE 100 MG CAPS: 100 | 5 days supply | Qty: 5 | Fill #0

## 2020-07-08 ENCOUNTER — Other Ambulatory Visit: Payer: Self-pay | Admitting: Radiation Therapy

## 2020-07-18 ENCOUNTER — Other Ambulatory Visit: Payer: Self-pay

## 2020-07-18 ENCOUNTER — Ambulatory Visit (HOSPITAL_COMMUNITY)
Admission: RE | Admit: 2020-07-18 | Discharge: 2020-07-18 | Disposition: A | Discharge status: Home / Self Care | Payer: BC Managed Care – PPO | Source: Ambulatory Visit | Attending: Internal Medicine | Admitting: Internal Medicine

## 2020-07-18 DIAGNOSIS — C719 Malignant neoplasm of brain, unspecified: Secondary | ICD-10-CM | POA: Insufficient documentation

## 2020-07-18 MED ORDER — GADOBUTROL 1 MMOL/ML IV SOLN
6.0000 mL | Freq: Once | INTRAVENOUS | Status: AC | PRN
  Administered 2020-07-18: 6 mL via INTRAVENOUS

## 2020-07-21 ENCOUNTER — Inpatient Hospital Stay: Payer: BC Managed Care – PPO

## 2020-07-21 ENCOUNTER — Other Ambulatory Visit: Payer: Self-pay

## 2020-07-21 ENCOUNTER — Inpatient Hospital Stay: Payer: BC Managed Care – PPO | Attending: Internal Medicine | Admitting: Internal Medicine

## 2020-07-21 VITALS — BP 105/94 | HR 76 | Temp 97.4°F | Resp 18 | Ht 67.0 in | Wt 147.4 lb

## 2020-07-21 DIAGNOSIS — D509 Iron deficiency anemia, unspecified: Secondary | ICD-10-CM | POA: Diagnosis not present

## 2020-07-21 DIAGNOSIS — E559 Vitamin D deficiency, unspecified: Secondary | ICD-10-CM | POA: Diagnosis not present

## 2020-07-21 DIAGNOSIS — C719 Malignant neoplasm of brain, unspecified: Secondary | ICD-10-CM

## 2020-07-21 DIAGNOSIS — D696 Thrombocytopenia, unspecified: Secondary | ICD-10-CM | POA: Insufficient documentation

## 2020-07-21 DIAGNOSIS — C711 Malignant neoplasm of frontal lobe: Secondary | ICD-10-CM | POA: Insufficient documentation

## 2020-07-21 DIAGNOSIS — R569 Unspecified convulsions: Secondary | ICD-10-CM | POA: Insufficient documentation

## 2020-07-21 DIAGNOSIS — Z9221 Personal history of antineoplastic chemotherapy: Secondary | ICD-10-CM | POA: Insufficient documentation

## 2020-07-21 LAB — CBC WITH DIFFERENTIAL (CANCER CENTER ONLY)
Abs Immature Granulocytes: 0 10*3/uL (ref 0.00–0.07)
Basophils Absolute: 0 10*3/uL (ref 0.0–0.1)
Basophils Relative: 0 %
Eosinophils Absolute: 0.1 10*3/uL (ref 0.0–0.5)
Eosinophils Relative: 4 %
HCT: 37.8 % (ref 36.0–46.0)
Hemoglobin: 12.5 g/dL (ref 12.0–15.0)
Immature Granulocytes: 0 %
Lymphocytes Relative: 24 %
Lymphs Abs: 0.7 10*3/uL (ref 0.7–4.0)
MCH: 30.6 pg (ref 26.0–34.0)
MCHC: 33.1 g/dL (ref 30.0–36.0)
MCV: 92.4 fL (ref 80.0–100.0)
Monocytes Absolute: 0.4 10*3/uL (ref 0.1–1.0)
Monocytes Relative: 14 %
Neutro Abs: 1.6 10*3/uL — ABNORMAL LOW (ref 1.7–7.7)
Neutrophils Relative %: 58 %
Platelet Count: 126 10*3/uL — ABNORMAL LOW (ref 150–400)
RBC: 4.09 MIL/uL (ref 3.87–5.11)
RDW: 12.4 % (ref 11.5–15.5)
WBC Count: 2.8 10*3/uL — ABNORMAL LOW (ref 4.0–10.5)
nRBC: 0 % (ref 0.0–0.2)

## 2020-07-21 LAB — CMP (CANCER CENTER ONLY)
ALT: 16 U/L (ref 0–44)
AST: 13 U/L — ABNORMAL LOW (ref 15–41)
Albumin: 3.7 g/dL (ref 3.5–5.0)
Alkaline Phosphatase: 64 U/L (ref 38–126)
Anion gap: 7 (ref 5–15)
BUN: 18 mg/dL (ref 8–23)
CO2: 31 mmol/L (ref 22–32)
Calcium: 9.4 mg/dL (ref 8.9–10.3)
Chloride: 106 mmol/L (ref 98–111)
Creatinine: 0.8 mg/dL (ref 0.44–1.00)
GFR, Estimated: >60 mL/min (ref >60)
Glucose, Bld: 93 mg/dL (ref 70–99)
Potassium: 3.9 mmol/L (ref 3.5–5.1)
Sodium: 144 mmol/L (ref 135–145)
Total Bilirubin: 0.5 mg/dL (ref 0.3–1.2)
Total Protein: 6.8 g/dL (ref 6.5–8.1)

## 2020-07-21 MED ORDER — DEXAMETHASONE 4 MG PO TABS: 4.0000 mg | ORAL_TABLET | Freq: Every day | ORAL | 0 refills | Status: DC

## 2020-07-21 NOTE — Progress Notes (Signed)
[facility] [place], [place] [address] [phone]   Interval Evaluation  Date of Service: 07/21/20 Patient Name: Alicia Mcdonald Patient MRN: [id number] Patient DOB: August 08, 1956 Provider: Ventura Sellers, MD  Identifying Statement:  Alicia Mcdonald is a 64 y.o. female with bifrontal, callosal high grade glioma   Oncologic History: Oncology History  High grade glioma not classifiable by WHO criteria ([place])  02/06/2020 Surgery   Stereotactic biopsy by Dr. Marcello Moores   02/26/2020 - 02/26/2020 Chemotherapy   The patient had temozolomide (TEMODAR) 20 MG capsule, 20 mg (100 % of original dose 20 mg), Oral, Daily, 1 of 1 cycle, Start date: 02/26/2020, End date: 05/20/2020 Dose modification: 20 mg (original dose 20 mg, Cycle 1) temozolomide (TEMODAR) 100 MG capsule, 100 mg (100 % of original dose 100 mg), Oral, Daily, 1 of 1 cycle, Start date: 02/26/2020, End date: 05/20/2020 Dose modification: 100 mg (original dose 100 mg, Cycle 1)  for chemotherapy treatment.    05/20/2020 -  Chemotherapy   The patient had temozolomide (TEMODAR) 250 MG capsule, 250 mg (1250 % of original dose 20 mg), Oral, Daily, 1 of 1 cycle, Start date: 06/23/2020, End date: -- Dose modification: 20 mg (original dose 20 mg, Cycle 1), 250 mg (original dose 20 mg, Cycle 1) temozolomide (TEMODAR) 100 MG capsule, 100 mg, Oral, Daily, 1 of 1 cycle, Start date: 06/23/2020, End date: -- Dose modification: 100 mg (original dose 100 mg, Cycle 1) temozolomide (TEMODAR) 140 MG capsule, 140 mg (100 % of original dose 140 mg), Oral, Daily, 1 of 1 cycle, Start date: 05/20/2020, End date: -- Dose modification: 140 mg (original dose 140 mg, Cycle 1)  for chemotherapy treatment.      Biomarkers:  MGMT Unknown.  IDH 1/2 Wild type.  EGFR Unknown  TERT Unknown   Interval History:  Alicia Mcdonald presents today for follow up following 2nd cycle of 5-day Temodar.  Tolerated chemo well  without issue.  Patient and mother acknowledge some odd behaviors and confusion this month.  She demonstrated some obsessive/repetitive behaviors with her dogs which worried her mom. Energy levels are otherwise stable. Still ambulating independently.  No further seizures, denies headaches.   H+P (02/25/20) Patient presented to medical attention in May 2021 with sudden onset confusion and disorientation.  She was found by her mother in this state after being seen normal shortly prior.  Apparently had a clinical seizure on the way to ED or in the ED.  CNS imaging demonstrated enhancing mass within corpus callosum and fornix, extending into frontal lobes bilaterally.  She underwent biopsy and ventriculostomy placement on 5/26 with Dr. Marcello Moores; path was sent out to Dr. Maisie Fus at [facility] and demonstrated ungradable IDH-1 wild type glioma.  By several days post-op she had returned to baseline funtional status, fully intact without complaint.  Today she denies any neurologic deficits aside from some memory impairment.    Medications: Current Outpatient Medications on File Prior to Visit  Medication Sig Dispense Refill  . cholecalciferol (VITAMIN D) 1000 UNITS tablet Take 1,000 Units by mouth daily.    Marland Kitchen levETIRAcetam (KEPPRA) 500 MG tablet Take 1 tablet (500 mg total) by mouth 2 (two) times daily. 60 tablet 5  . ondansetron (ZOFRAN) 8 MG tablet Take 1 tablet (8 mg total) by mouth 2 (two) times daily as needed (nausea and vomiting). May take 30-60 minutes prior to Temodar administration if nausea/vomiting occurs. (Patient not taking: Reported on  06/23/2020) 30 tablet 1  . temozolomide (TEMODAR) 100 MG capsule Take 1 capsule (100 mg total) by mouth daily. May take on an empty stomach to decrease nausea & vomiting. (Patient not taking: Reported on 06/23/2020) 5 capsule 0  . temozolomide (TEMODAR) 100 MG capsule Take 1 capsule (100 mg total) by mouth daily. May take on an empty stomach to decrease nausea & vomiting.  (Patient not taking: Reported on 07/21/2020) 5 capsule 0  . temozolomide (TEMODAR) 140 MG capsule Take 1 capsule (140 mg total) by mouth daily. May take on an empty stomach to decrease nausea & vomiting. (Patient not taking: Reported on 06/23/2020) 5 capsule 0  . temozolomide (TEMODAR) 20 MG capsule Take 1 capsule (20 mg total) by mouth daily. May take on an empty stomach to decrease nausea & vomiting. (Patient not taking: Reported on 06/23/2020) 5 capsule 0  . temozolomide (TEMODAR) 250 MG capsule Take 1 capsule (250 mg total) by mouth daily. May take on an empty stomach to decrease nausea & vomiting. (Patient not taking: Reported on 07/21/2020) 5 capsule 0   No current facility-administered medications on file prior to visit.    Allergies:  Allergies  Allergen Reactions  . Tylenol [Acetaminophen] Other (See Comments)    Blood in urine with excessive use   Past Medical History:  Past Medical History:  Diagnosis Date  . Cancer ([place])   . Iron deficiency   . Migraine   . Vitamin D deficiency    Past Surgical History:  Past Surgical History:  Procedure Laterality Date  . APPLICATION OF CRANIAL NAVIGATION N/A 02/06/2020   Procedure: APPLICATION OF CRANIAL NAVIGATION;  Surgeon: Vallarie Mare, MD;  Location: [facility];  Service: Neurosurgery;  Laterality: N/A;  . COLONOSCOPY N/A 04/24/2013   Procedure: COLONOSCOPY;  Surgeon: Danie Binder, MD;  [facility];  Service: Endoscopy;  Laterality: N/A;  1:45  . FRAMELESS  BIOPSY WITH BRAINLAB N/A 02/06/2020   Procedure: FRAMELESS BIOPSY WITH Lucky Rathke;  Surgeon: Vallarie Mare, MD;  Location: [facility];  Service: Neurosurgery;  Laterality: N/A;  . [place]  . VENTRICULOSTOMY N/A 02/06/2020   Procedure: VENTRICULOSTOMY AND ENDOSCOPIC SEPTOSTOMY;  Surgeon: Vallarie Mare, MD;  Location: [facility];  Service: Neurosurgery;  Laterality: N/A;   Social History:  Social History   Socioeconomic History  . Marital status: Single     Spouse name: Not on file  . Number of children: Not on file  . Years of education: Not on file  . Highest education level: Not on file  Occupational History  . 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 Smoking status: Never Smoker  . Smokeless tobacco: Never Used  Substance and Sexual Activity  . Alcohol use: Yes    Comment: once a month, rare  . Drug use: No  . Sexual activity: Not on file  Other Topics Concern  . Not on file  Social History Narrative  . Not on file   Social Determinants of Health   Financial Resource Strain:   . Difficulty of Paying Living Expenses: Not on file  Food Insecurity:   . Worried About Charity fundraiser in the Last Year: Not on file  . Ran Out of Food in the Last Year: Not on file  Transportation Needs:   . Lack of Transportation (Medical): Not on file  . Lack of Transportation (Non-Medical):  Not on file  Physical Activity:   . Days of Exercise per Week: Not on file  . Minutes of Exercise per Session: Not on file  Stress:   . Feeling of Stress : Not on file  Social Connections:   . Frequency of Communication with Friends and Family: Not on file  . Frequency of Social Gatherings with Friends and Family: Not on file  . Attends Religious Services: Not on file  . Active Member of Clubs or Organizations: Not on file  . Attends Archivist Meetings: Not on file  . Marital Status: Not on file  Intimate Partner Violence:   . Fear of Current or Ex-Partner: Not on file  . Emotionally Abused: Not on file  . Physically Abused: Not on file  . Sexually Abused: Not on file   Family History:  Family History  Problem Relation Age of Onset  . Colon cancer Neg Hx     Review of Systems: Constitutional: Doesn't report fevers, chills or abnormal weight loss Eyes: Doesn't report blurriness of vision Ears, nose, mouth, throat, and face: Doesn't report  sore throat Respiratory: Doesn't report cough, dyspnea or wheezes Cardiovascular: Doesn't report palpitation, chest discomfort  Gastrointestinal:  Doesn't report nausea, constipation, diarrhea GU: Doesn't report incontinence Skin: Doesn't report skin rashes Neurological: Per HPI Musculoskeletal: Doesn't report joint pain Behavioral/Psych: Doesn't report anxiety  Physical Exam: Vitals:   07/21/20 1143  BP: (!) 105/94  Pulse: 76  Resp: 18  Temp: (!) 97.4 F (36.3 C)  SpO2: 99%   KPS: 90. General: Alert, cooperative, pleasant, in no acute distress Head: Normal EENT: No conjunctival injection or scleral icterus.  Lungs: Resp effort normal Cardiac: Regular rate Abdomen: Non-distended abdomen Skin: No rashes cyanosis or petechiae. Extremities: No clubbing or edema  Neurologic Exam: Mental Status: Awake, alert, attentive to examiner. Oriented to self and environment. Language is fluent with intact comprehension.  Cranial Nerves: Visual acuity is grossly normal. Visual fields are full. Extra-ocular movements intact. No ptosis. Face is symmetric Motor: Tone and bulk are normal. Power is full in both arms and legs. Reflexes are symmetric, no pathologic reflexes present.  Sensory: Intact to light touch Gait: Normal.   Labs: I have reviewed the data as listed    Component Value Date/Time   NA 144 07/21/2020 1111   K 3.9 07/21/2020 1111   CL 106 07/21/2020 1111   CO2 31 07/21/2020 1111   GLUCOSE 93 07/21/2020 1111   BUN 18 07/21/2020 1111   CREATININE 0.80 07/21/2020 1111   CALCIUM 9.4 07/21/2020 1111   PROT 6.8 07/21/2020 1111   ALBUMIN 3.7 07/21/2020 1111   AST 13 (L) 07/21/2020 1111   ALT 16 07/21/2020 1111   ALKPHOS 64 07/21/2020 1111   BILITOT 0.5 07/21/2020 1111   GFRNONAA >60 07/21/2020 1111   GFRAA >60 04/15/2020 1006   Lab Results  Component Value Date   WBC 2.8 (L) 07/21/2020   NEUTROABS 1.6 (L) 07/21/2020   HGB 12.5 07/21/2020   HCT 37.8 07/21/2020   MCV  92.4 07/21/2020   PLT 126 (L) 07/21/2020   Imaging:  [place] Clinician Interpretation: I have personally reviewed the CNS images as listed.  My interpretation, in the context of the patient's clinical presentation, is treatment effect vs true progression  MR BRAIN W WO CONTRAST  Result Date: 07/18/2020 CLINICAL DATA:  Brain/CNS neoplasm, assess treatment response. EXAM: MRI HEAD WITHOUT AND WITH CONTRAST TECHNIQUE: Multiplanar, multiecho pulse sequences of the brain and surrounding structures were  obtained without and with intravenous contrast. CONTRAST:  3m GADAVIST GADOBUTROL 1 MMOL/ML IV SOLN COMPARISON:  MRI of the brain May 16, 2020. FINDINGS: Brain: There is interval increase of the necrotic lesion with irregular peripheral contrast enhancement centered on the genu of the corpus callosum and extending along the fornices and bilateral frontal lobes, measuring approximately 4.5 x 4.5 x 3.0 cm (4.1 x 3.5 x 3.0 cm on prior). T2 hyperintense signal surrounding the lesion, within the bilateral frontal lobes, is also increased. Enhancement of the lateral ventricles walls consistent with subependymal spread, unchanged. The ventricles are mildly dilated, stable from prior MRI. Tracts from prior ventriculostomy and surgery in the right frontal and temporal lobes are unchanged. No acute infarct, hemorrhage or extra-axial collection. Vascular: Normal flow voids. Skull and upper cervical spine: No focal marrow lesion. Sinuses/Orbits: Negative. Other: Degenerative changes of the temporomandibular joints. IMPRESSION: 1. Interval increase of the known glioma centered on the genu of the corpus callosum, now measuring approximately 4.5 x 4.5 x 3.0 cm with increased area of increased T2 signal surrounding the lesion, within the bilateral frontal lobes. 2. 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 Electronically Signed   By: KPedro EarlsM.D.   On: 07/18/2020 20:44    Assessment/Plan High grade glioma not classifiable by WHO criteria ([place] [C71.9]  FGlenna Fellowsdemonstrates modest behavioral changes today, subjectively, which could correlate with progressive changes visualized on recent MRI brain.  Because of timing of study and location of changes within high dose RT field, this could be consistent with pseudo-progression.    We recommended dosing decadron 463mdaily x7 days, preceded by 18m26maily x3 days.    Will hold off on dosing third cycle of Temodar until we assess response to decadron in 1 week via phone visit.  This will also allow some additional time for bone marrow regeneration given modest thrombocytopenia demonstrated on labs today.    For seizures, she will continue Keppra 500m54mD.   All questions were answered. The patient knows to call the clinic with any problems, questions or concerns. No barriers to learning were detected.  I have spent a total of 40 minutes of face-to-face and non-face-to-face time, excluding clinical staff time, preparing to see patient, ordering tests and/or medications, counseling the patient, and independently interpreting results and communicating results to the patient/family/caregiver    ZachVentura Sellers Medical Director of Neuro-Oncology [facility] CentersWeslBowmans Addition08/21 3:28 PM

## 2020-07-22 ENCOUNTER — Telehealth: Payer: Self-pay | Admitting: Internal Medicine

## 2020-07-22 NOTE — Telephone Encounter (Signed)
Scheduled per 11/8 los. Pt is aware of appt time and date.

## 2020-07-23 ENCOUNTER — Other Ambulatory Visit: Payer: Self-pay | Admitting: Internal Medicine

## 2020-07-23 DIAGNOSIS — C719 Malignant neoplasm of brain, unspecified: Secondary | ICD-10-CM

## 2020-07-28 ENCOUNTER — Other Ambulatory Visit: Payer: Self-pay | Admitting: Internal Medicine

## 2020-07-28 ENCOUNTER — Inpatient Hospital Stay (HOSPITAL_BASED_OUTPATIENT_CLINIC_OR_DEPARTMENT_OTHER): Payer: BC Managed Care – PPO | Admitting: Internal Medicine

## 2020-07-28 DIAGNOSIS — R569 Unspecified convulsions: Secondary | ICD-10-CM

## 2020-07-28 DIAGNOSIS — C719 Malignant neoplasm of brain, unspecified: Secondary | ICD-10-CM

## 2020-07-28 MED ORDER — TEMOZOLOMIDE 100 MG PO CAPS: 100.0000 mg | ORAL_CAPSULE | Freq: Every day | ORAL | 0 refills | Status: DC

## 2020-07-28 MED ORDER — DEXAMETHASONE 4 MG PO TABS
4.0000 mg | ORAL_TABLET | Freq: Every day | ORAL | 0 refills | Status: DC
Start: 2020-07-28 — End: 2020-08-18

## 2020-07-28 MED ORDER — TEMOZOLOMIDE 250 MG PO CAPS: 250.0000 mg | ORAL_CAPSULE | Freq: Every day | ORAL | 0 refills | Status: DC

## 2020-07-28 NOTE — Progress Notes (Signed)
I connected with Alicia Mcdonald on 07/28/20 at 11:30 AM EST by telephone visit and verified that I am speaking with the correct person using two identifiers.  I discussed the limitations, risks, security and privacy concerns of performing an evaluation and management service by telemedicine and the availability of in-person appointments. I also discussed with the patient that there may be a patient responsible charge related to this service. The patient expressed understanding and agreed to proceed.  Other persons participating in the visit and their role in the encounter:  n/a  Patient's location:  Home  Provider's location:  Office  Chief Complaint:  High grade glioma not classifiable by WHO criteria ([place]) - Plan: temozolomide (TEMODAR) 250 MG capsule, temozolomide (TEMODAR) 100 MG capsule  Seizure ([place])  History of Present Ilness: Alicia Mcdonald describes improvement in cognition, short term memory since starting the steroid.  She is currently dosing 4mg  daily.  No new or progressive deficits otherwise. Observations: Language and cognition at baseline  Assessment and Plan: High grade glioma not classifiable by WHO criteria ([place]) - Plan: temozolomide (TEMODAR) 250 MG capsule, temozolomide (TEMODAR) 100 MG capsule  Seizure ([place])  We recommended continuing treatment with cycle #3 Temozolomide 200 mg/m2, on for five days and off for twenty three days in twenty eight day cycles. The patient will have a complete blood count performed on days 21 and 28 of each cycle, and a comprehensive metabolic panel performed on day 28 of each cycle. Labs may need to be performed more often. Zofran will prescribed for home use for nausea/vomiting.   Chemotherapy should be held for the following:  ANC less than 1,000  Platelets less than 100,000  LFT or creatinine greater than 2x ULN  If clinical concerns/contraindications develop   Follow Up Instructions: RTC in 1 month prior to cycle #4.  May con't  decadron 4mg  daily in the interim.  I discussed the assessment and treatment plan with the patient.  The patient was provided an opportunity to ask questions and all were answered.  The patient agreed with the plan and demonstrated understanding of the instructions.    The patient was advised to call back or seek an in-person evaluation if the symptoms worsen or if the condition fails to improve as anticipated.  I provided 5-10 minutes of non-face-to-face time during this enocunter.  Ventura Sellers, MD   I provided 15 minutes of non face-to-face telephone visit time during this encounter, and > 50% was spent counseling as documented under my assessment & plan.

## 2020-07-29 ENCOUNTER — Telehealth: Payer: Self-pay | Admitting: Internal Medicine

## 2020-07-29 MED FILL — TEMOZOLOMIDE 100 MG CAPS: 100 | 5 days supply | Qty: 5 | Fill #0

## 2020-07-29 MED FILL — TEMOZOLOMIDE 250 MG CAPS: 250 | 5 days supply | Qty: 5 | Fill #0

## 2020-07-29 NOTE — Telephone Encounter (Signed)
Rx refill for review

## 2020-07-29 NOTE — Telephone Encounter (Signed)
Scheduled per 11/15 los. Pt requested appts on 11/14 around 11am. Pt is aware of appt times and date.

## 2020-08-18 ENCOUNTER — Inpatient Hospital Stay: Payer: BC Managed Care – PPO | Attending: Internal Medicine | Admitting: Internal Medicine

## 2020-08-18 ENCOUNTER — Other Ambulatory Visit: Payer: Self-pay | Admitting: Radiation Therapy

## 2020-08-18 ENCOUNTER — Telehealth: Payer: Self-pay | Admitting: *Deleted

## 2020-08-18 DIAGNOSIS — C711 Malignant neoplasm of frontal lobe: Secondary | ICD-10-CM | POA: Insufficient documentation

## 2020-08-18 DIAGNOSIS — Z9221 Personal history of antineoplastic chemotherapy: Secondary | ICD-10-CM | POA: Insufficient documentation

## 2020-08-18 DIAGNOSIS — D509 Iron deficiency anemia, unspecified: Secondary | ICD-10-CM | POA: Insufficient documentation

## 2020-08-18 DIAGNOSIS — R569 Unspecified convulsions: Secondary | ICD-10-CM | POA: Diagnosis not present

## 2020-08-18 DIAGNOSIS — C719 Malignant neoplasm of brain, unspecified: Secondary | ICD-10-CM | POA: Diagnosis not present

## 2020-08-18 DIAGNOSIS — E559 Vitamin D deficiency, unspecified: Secondary | ICD-10-CM | POA: Insufficient documentation

## 2020-08-18 DIAGNOSIS — Z7952 Long term (current) use of systemic steroids: Secondary | ICD-10-CM | POA: Insufficient documentation

## 2020-08-18 DIAGNOSIS — Z85820 Personal history of malignant melanoma of skin: Secondary | ICD-10-CM | POA: Insufficient documentation

## 2020-08-18 DIAGNOSIS — Z79899 Other long term (current) drug therapy: Secondary | ICD-10-CM | POA: Insufficient documentation

## 2020-08-18 MED ORDER — DEXAMETHASONE 4 MG PO TABS
4.0000 mg | ORAL_TABLET | Freq: Every day | ORAL | 0 refills | Status: DC
Start: 2020-08-18 — End: 2020-09-11

## 2020-08-18 NOTE — Telephone Encounter (Signed)
Patients mom Inez Catalina) called with concerns.  States patient is confused and having hallunications.  Patient is very weak in the legs.  No falls as of yet.  Speech is clear.  Sleeping longer at night.  Patients mother reported stopping Decadron around November 11th.  Took Temodar 11/15-11/19/2021.    Scheduled phone visit per Dr Mickeal Skinner to discuss new concerns.

## 2020-08-19 NOTE — Progress Notes (Signed)
I connected with Alicia Mcdonald on 08/19/20 at  2:00 PM EST by telephone visit and verified that I am speaking with the correct person using two identifiers.  I discussed the limitations, risks, security and privacy concerns of performing an evaluation and management service by telemedicine and the availability of in-person appointments. I also discussed with the patient that there may be a patient responsible charge related to this service. The patient expressed understanding and agreed to proceed.  Other persons participating in the visit and their role in the encounter:  mother  Patient's location:  Home  Provider's location:  Office  Chief Complaint:  High grade glioma not classifiable by WHO criteria [facility]) - Plan: MR BRAIN W WO CONTRAST  Seizure ([place])   History of Present Ilness: Alicia Mcdonald and her mother describe several days history of increased confusion, disorientation.  She has experienced some visual hallucinations as well.  Continues to mostly care for herself but mother has to help direct and redirect her recently.  No issues with gait or motor function.  No witnessed seizure activity or sudden changes in awareness or arousal.  No systemic complaints such as fever, urinary symptoms. Observations: Language fluent with intact comrephension Assessment and Plan: High grade glioma not classifiable by WHO criteria ([place]) - Plan: MR BRAIN W WO CONTRAST  Seizure ([place])  Suspect ongoing radio-inflammatory changes (as seen on most recent MRI) or frank progression of tumor.  Recommended re-challenging with dexamethasone 4mg  BID x4 days, followed by 4mg  daily.  We will also reschedule brain MRI for later this week, given clinical changes.  Follow Up Instructions: RTC next week following MRI  I discussed the assessment and treatment plan with the patient.  The patient was provided an opportunity to ask questions and all were answered.  The patient agreed with the plan and demonstrated  understanding of the instructions.    The patient was advised to call back or seek an in-person evaluation if the symptoms worsen or if the condition fails to improve as anticipated.  I provided 5-10 minutes of non-face-to-face time during this enocunter.  Ventura Sellers, MD   I provided 15 minutes of non face-to-face telephone visit time during this encounter, and > 50% was spent counseling as documented under my assessment & plan.

## 2020-08-22 ENCOUNTER — Other Ambulatory Visit: Payer: Self-pay

## 2020-08-22 ENCOUNTER — Ambulatory Visit (HOSPITAL_COMMUNITY)
Admission: RE | Admit: 2020-08-22 | Discharge: 2020-08-22 | Disposition: A | Discharge status: Home / Self Care | Payer: BC Managed Care – PPO | Source: Ambulatory Visit | Attending: Internal Medicine | Admitting: Internal Medicine

## 2020-08-22 DIAGNOSIS — C719 Malignant neoplasm of brain, unspecified: Secondary | ICD-10-CM | POA: Insufficient documentation

## 2020-08-22 MED ORDER — GADOBUTROL 1 MMOL/ML IV SOLN
7.0000 mL | Freq: Once | INTRAVENOUS | Status: AC | PRN
  Administered 2020-08-22: 7 mL via INTRAVENOUS

## 2020-08-25 ENCOUNTER — Inpatient Hospital Stay: Payer: BC Managed Care – PPO

## 2020-08-26 ENCOUNTER — Inpatient Hospital Stay: Payer: BC Managed Care – PPO

## 2020-08-26 ENCOUNTER — Other Ambulatory Visit: Payer: Self-pay

## 2020-08-26 ENCOUNTER — Ambulatory Visit: Payer: BC Managed Care – PPO | Admitting: Internal Medicine

## 2020-08-26 ENCOUNTER — Inpatient Hospital Stay (HOSPITAL_BASED_OUTPATIENT_CLINIC_OR_DEPARTMENT_OTHER): Payer: BC Managed Care – PPO | Admitting: Internal Medicine

## 2020-08-26 VITALS — BP 113/66 | HR 66 | Temp 97.2°F | Resp 16 | Ht 67.0 in | Wt 151.8 lb

## 2020-08-26 DIAGNOSIS — C711 Malignant neoplasm of frontal lobe: Secondary | ICD-10-CM | POA: Diagnosis not present

## 2020-08-26 DIAGNOSIS — Z85820 Personal history of malignant melanoma of skin: Secondary | ICD-10-CM | POA: Diagnosis not present

## 2020-08-26 DIAGNOSIS — R569 Unspecified convulsions: Secondary | ICD-10-CM | POA: Diagnosis not present

## 2020-08-26 DIAGNOSIS — E559 Vitamin D deficiency, unspecified: Secondary | ICD-10-CM | POA: Diagnosis not present

## 2020-08-26 DIAGNOSIS — C719 Malignant neoplasm of brain, unspecified: Secondary | ICD-10-CM

## 2020-08-26 DIAGNOSIS — Z7952 Long term (current) use of systemic steroids: Secondary | ICD-10-CM | POA: Diagnosis not present

## 2020-08-26 DIAGNOSIS — Z7189 Other specified counseling: Secondary | ICD-10-CM | POA: Diagnosis not present

## 2020-08-26 DIAGNOSIS — Z9221 Personal history of antineoplastic chemotherapy: Secondary | ICD-10-CM | POA: Diagnosis not present

## 2020-08-26 DIAGNOSIS — Z79899 Other long term (current) drug therapy: Secondary | ICD-10-CM | POA: Diagnosis not present

## 2020-08-26 DIAGNOSIS — D509 Iron deficiency anemia, unspecified: Secondary | ICD-10-CM | POA: Diagnosis not present

## 2020-08-26 LAB — CMP (CANCER CENTER ONLY)
ALT: 19 U/L (ref 0–44)
AST: 11 U/L — ABNORMAL LOW (ref 15–41)
Albumin: 3.3 g/dL — ABNORMAL LOW (ref 3.5–5.0)
Alkaline Phosphatase: 66 U/L (ref 38–126)
Anion gap: 7 (ref 5–15)
BUN: 16 mg/dL (ref 8–23)
CO2: 32 mmol/L (ref 22–32)
Calcium: 9.2 mg/dL (ref 8.9–10.3)
Chloride: 102 mmol/L (ref 98–111)
Creatinine: 0.83 mg/dL (ref 0.44–1.00)
GFR, Estimated: >60 mL/min (ref >60)
Glucose, Bld: 127 mg/dL — ABNORMAL HIGH (ref 70–99)
Potassium: 3.7 mmol/L (ref 3.5–5.1)
Sodium: 141 mmol/L (ref 135–145)
Total Bilirubin: 0.4 mg/dL (ref 0.3–1.2)
Total Protein: 6.4 g/dL — ABNORMAL LOW (ref 6.5–8.1)

## 2020-08-26 LAB — CBC WITH DIFFERENTIAL (CANCER CENTER ONLY)
Abs Immature Granulocytes: 0.05 10*3/uL (ref 0.00–0.07)
Basophils Absolute: 0 10*3/uL (ref 0.0–0.1)
Basophils Relative: 0 %
Eosinophils Absolute: 0 10*3/uL (ref 0.0–0.5)
Eosinophils Relative: 0 %
HCT: 36.2 % (ref 36.0–46.0)
Hemoglobin: 12 g/dL (ref 12.0–15.0)
Immature Granulocytes: 1 %
Lymphocytes Relative: 11 %
Lymphs Abs: 0.7 10*3/uL (ref 0.7–4.0)
MCH: 31.3 pg (ref 26.0–34.0)
MCHC: 33.1 g/dL (ref 30.0–36.0)
MCV: 94.5 fL (ref 80.0–100.0)
Monocytes Absolute: 0.5 10*3/uL (ref 0.1–1.0)
Monocytes Relative: 8 %
Neutro Abs: 5.4 10*3/uL (ref 1.7–7.7)
Neutrophils Relative %: 80 %
Platelet Count: 188 10*3/uL (ref 150–400)
RBC: 3.83 MIL/uL — ABNORMAL LOW (ref 3.87–5.11)
RDW: 14.1 % (ref 11.5–15.5)
WBC Count: 6.8 10*3/uL (ref 4.0–10.5)
nRBC: 0 % (ref 0.0–0.2)

## 2020-08-26 MED ORDER — LAMOTRIGINE 25 MG PO TABS: 50.0000 mg | ORAL_TABLET | Freq: Every day | ORAL | 0 refills | Status: DC

## 2020-08-26 MED ORDER — LORAZEPAM 2 MG PO TABS: 2.0000 mg | ORAL_TABLET | Freq: Three times a day (TID) | ORAL | 0 refills | Status: DC | PRN

## 2020-08-26 MED ORDER — LAMOTRIGINE 100 MG PO TABS: ORAL_TABLET | ORAL | 1 refills | Status: DC

## 2020-08-26 NOTE — Progress Notes (Signed)
[place] at [place], [place] [address] [phone]   Interval Evaluation  Date of Service: 08/26/20 Patient Name: Alicia Mcdonald Patient MRN: [id number] Patient DOB: [birth year] Provider: Ventura Sellers, MD  Identifying Statement:  Alicia Mcdonald is a 64 y.o. female with bifrontal, callosal high grade glioma   Oncologic History: Oncology History  High grade glioma not classifiable by WHO criteria ([place])  02/06/2020 Surgery   Stereotactic biopsy by Dr. Marcello Moores   02/26/2020 - 02/26/2020 Chemotherapy   The patient had temozolomide (TEMODAR) 20 MG capsule, 20 mg (100 % of original dose 20 mg), Oral, Daily, 1 of 1 cycle, Start date: 02/26/2020, End date: 05/20/2020 Dose modification: 20 mg (original dose 20 mg, Cycle 1) temozolomide (TEMODAR) 100 MG capsule, 100 mg (100 % of original dose 100 mg), Oral, Daily, 1 of 1 cycle, Start date: 02/26/2020, End date: 05/20/2020 Dose modification: 100 mg (original dose 100 mg, Cycle 1)  for chemotherapy treatment.    05/20/2020 -  Chemotherapy   The patient had temozolomide (TEMODAR) 250 MG capsule, 250 mg (1250 % of original dose 20 mg), Oral, Daily, 1 of 1 cycle, Start date: 06/23/2020, End date: -- Dose modification: 20 mg (original dose 20 mg, Cycle 1), 250 mg (original dose 20 mg, Cycle 1) temozolomide (TEMODAR) 100 MG capsule, 100 mg, Oral, Daily, 1 of 1 cycle, Start date: 07/28/2020, End date: -- Dose modification: 100 mg (original dose 100 mg, Cycle 1) temozolomide (TEMODAR) 250 MG capsule, 250 mg, Oral, Daily, 1 of 1 cycle, Start date: 07/28/2020, End date: -- temozolomide (TEMODAR) 140 MG capsule, 140 mg (100 % of original dose 140 mg), Oral, Daily, 1 of 1 cycle, Start date: 05/20/2020, End date: -- Dose modification: 140 mg (original dose 140 mg, Cycle 1)  for chemotherapy treatment.      Biomarkers:  MGMT Unknown.  IDH 1/2 Wild type.  EGFR Unknown  TERT Unknown   Interval History:   Alicia Mcdonald presents today for follow up following 3rd cycle of 5-day Temodar.  Cognitive changes, periods of confusion this past week are detailed in phone visit from 08/18/20.  Mother describes several brief seizure episodes this weekend, one Saturday and two Sunday.  Patient acknowledges some visual hallucinations today, seeing animals. Othewrise tolerated chemo well without issue. Still ambulating independently.   H+P (02/25/20) Patient presented to medical attention in May 2021 with sudden onset confusion and disorientation.  She was found by her mother in this state after being seen normal shortly prior.  Apparently had a clinical seizure on the way to ED or in the ED.  CNS imaging demonstrated enhancing mass within corpus callosum and fornix, extending into frontal lobes bilaterally.  She underwent biopsy and ventriculostomy placement on 5/26 with Dr. Marcello Moores; path was sent out to Dr. Maisie Fus at [facility] and demonstrated ungradable IDH-1 wild type glioma.  By several days post-op she had returned to baseline funtional status, fully intact without complaint.  Today she denies any neurologic deficits aside from some memory impairment.    Medications: Current Outpatient Medications on File Prior to Visit  Medication Sig Dispense Refill  . cholecalciferol (VITAMIN D) 1000 UNITS tablet Take 1,000 Units by mouth daily.    Marland Kitchen dexamethasone (DECADRON) 4 MG tablet Take 1 tablet (4 mg total) by mouth daily. 30 tablet 0  . levETIRAcetam (KEPPRA) 500 MG tablet Take 1 tablet (500 mg total) by mouth 2 (two) times daily. 60 tablet  5  . ondansetron (ZOFRAN) 8 MG tablet Take 1 tablet (8 mg total) by mouth 2 (two) times daily as needed (nausea and vomiting). May take 30-60 minutes prior to Temodar administration if nausea/vomiting occurs. (Patient not taking: No sig reported) 30 tablet 1  . temozolomide (TEMODAR) 100 MG capsule Take 1 capsule (100 mg total) by mouth daily. May take on an empty stomach to decrease  nausea & vomiting. (Patient not taking: No sig reported) 5 capsule 0  . temozolomide (TEMODAR) 250 MG capsule Take 1 capsule (250 mg total) by mouth daily. May take on an empty stomach to decrease nausea & vomiting. (Patient not taking: Reported on 08/26/2020) 5 capsule 0   No current facility-administered medications on file prior to visit.    Allergies:  Allergies  Allergen Reactions  . Tylenol [Acetaminophen] Other (See Comments)    Blood in urine with excessive use   Past Medical History:  Past Medical History:  Diagnosis Date  . Cancer ([place])   . Iron deficiency   . Migraine   . Vitamin D deficiency    Past Surgical History:  Past Surgical History:  Procedure Laterality Date  . APPLICATION OF CRANIAL NAVIGATION N/A 02/06/2020   Procedure: APPLICATION OF CRANIAL NAVIGATION;  Surgeon: Vallarie Mare, MD;  Location: [facility];  Service: Neurosurgery;  Laterality: N/A;  . COLONOSCOPY N/A 04/24/2013   Procedure: COLONOSCOPY;  Surgeon: Danie Binder, MD;  [facility];  Service: Endoscopy;  Laterality: N/A;  1:45  . FRAMELESS  BIOPSY WITH BRAINLAB N/A 02/06/2020   Procedure: FRAMELESS BIOPSY WITH Lucky Rathke;  Surgeon: Vallarie Mare, MD;  Location: [facility];  Service: Neurosurgery;  Laterality: N/A;  . [place]  . VENTRICULOSTOMY N/A 02/06/2020   Procedure: VENTRICULOSTOMY AND ENDOSCOPIC SEPTOSTOMY;  Surgeon: Vallarie Mare, MD;  Location: [facility];  Service: Neurosurgery;  Laterality: N/A;   Social History:  Social History   Socioeconomic History  . Marital status: Single    Spouse name: Not on file  . Number of children: Not on file  . Years of education: Not on file  . Highest education level: Not on file  Occupational History  . 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 Smoking status: Never Smoker  . Smokeless tobacco: Never Used  Substance and Sexual  Activity  . Alcohol use: Yes    Comment: once a month, rare  . Drug use: No  . Sexual activity: Not on file  Other Topics Concern  . Not on file  Social History Narrative  . Not on file   Social Determinants of Health   Financial Resource Strain: Not on file  Food Insecurity: Not on file  Transportation Needs: Not on file  Physical Activity: Not on file  Stress: Not on file  Social Connections: Not on file  Intimate Partner Violence: Not on file   Family History:  Family History  Problem Relation Age of Onset  . Colon cancer Neg Hx     Review of Systems: Constitutional: Doesn't report fevers, chills or abnormal weight loss Eyes: Doesn't report blurriness of vision Ears, nose, mouth, throat, and face: Doesn't report sore throat Respiratory: Doesn't report cough, dyspnea or wheezes Cardiovascular: Doesn't report palpitation, chest discomfort  Gastrointestinal:  Doesn't report nausea, constipation, diarrhea GU: Doesn't report incontinence Skin: Doesn't report skin rashes Neurological: Per HPI Musculoskeletal: Doesn't report joint pain Behavioral/Psych:  Doesn't report anxiety  Physical Exam: Vitals:   08/26/20 1121  BP: 113/66  Pulse: 66  Resp: 16  Temp: (!) 97.2 F (36.2 C)   KPS: 80. General: Alert, cooperative, pleasant, in no acute distress Head: Normal EENT: No conjunctival injection or scleral icterus.  Lungs: Resp effort normal Cardiac: Regular rate Abdomen: Non-distended abdomen Skin: No rashes cyanosis or petechiae. Extremities: No clubbing or edema  Neurologic Exam: Mental Status: Awake, alert, attentive to examiner. Oriented to self and environment. Language is fluent with intact comprehension.  Disorganized thought and speech patterns Cranial Nerves: Visual acuity is grossly normal. Visual fields are full. Extra-ocular movements intact. No ptosis. Face is symmetric Motor: Tone and bulk are normal. Power is full in both arms and legs. Reflexes are  symmetric, no pathologic reflexes present.  Sensory: Intact to light touch Gait: Normal.   Labs: I have reviewed the data as listed    Component Value Date/Time   NA 144 07/21/2020 1111   K 3.9 07/21/2020 1111   CL 106 07/21/2020 1111   CO2 31 07/21/2020 1111   GLUCOSE 93 07/21/2020 1111   BUN 18 07/21/2020 1111   CREATININE 0.80 07/21/2020 1111   CALCIUM 9.4 07/21/2020 1111   PROT 6.8 07/21/2020 1111   ALBUMIN 3.7 07/21/2020 1111   AST 13 (L) 07/21/2020 1111   ALT 16 07/21/2020 1111   ALKPHOS 64 07/21/2020 1111   BILITOT 0.5 07/21/2020 1111   GFRNONAA >60 07/21/2020 1111   GFRAA >60 04/15/2020 1006   Lab Results  Component Value Date   WBC 6.8 08/26/2020   NEUTROABS 5.4 08/26/2020   HGB 12.0 08/26/2020   HCT 36.2 08/26/2020   MCV 94.5 08/26/2020   PLT 188 08/26/2020   Imaging:  [facility] Clinician Interpretation: I have personally reviewed the CNS images as listed.  My interpretation, in the context of the patient's clinical presentation, is treatment effect vs true progression  MR BRAIN W WO CONTRAST  Result Date: 08/23/2020 CLINICAL DATA:  Follow-up examination for brain CNS neoplasm, assess treatment response. EXAM: MRI HEAD WITHOUT AND WITH CONTRAST TECHNIQUE: Multiplanar, multiecho pulse sequences of the brain and surrounding structures were obtained without and with intravenous contrast. CONTRAST:  14m GADAVIST GADOBUTROL 1 MMOL/ML IV SOLN COMPARISON:  Previous brain MRI from 07/18/2020 as well as earlier studies. FINDINGS: Brain: Previously identified necrotic irregular enhancing mass centered at the genu of the corpus callosum again seen. Lesion extends along the fornices bilaterally, with lateral extension into the adjacent frontal lobes as well. The enhancing portion of the lesion measures approximately 3.6 x 4.8 x 3.5 cm (AP by transverse by craniocaudad), not significantly changed from previous when measured in a similar fashion (also 3.6 x 4.8 x 3.5 cm).  Subependymal spread along the adjacent lateral ventricles again noted. Surrounding T2/FLAIR signal abnormality again seen within the frontal lobes bilaterally, left greater than right, increased from previous, most pronounced at the left frontal lobe. Ventriculomegaly noted, relatively similar to previous. No transependymal flow of CSF. Tract from prior ventriculostomy in surgery in the right frontal and temporal lobes again noted. Remainder of the brain remains relatively normal in appearance. No other acute infarct, mass lesion, hemorrhage, or extra-axial fluid collection. Small arachnoid cyst at the anterior left middle cranial fossa noted. Vascular: Major intracranial vascular flow voids are maintained. Skull and upper cervical spine: Craniocervical junction within normal limits. No focal marrow replacing lesion. No scalp soft tissue abnormality. Sinuses/Orbits: Globes and orbital soft tissues demonstrate no acute finding. Paranasal  sinuses and mastoid air cells remain largely clear. Degenerative change noted about the left TMJ. Other: None. IMPRESSION: 1. No significant interval change in size of the enhancing portion of patient's known glioma centered at the genu of the corpus callosum, with similar lateral extension into the adjacent frontal lobes. However, surrounding T2/FLAIR signal abnormality within the adjacent frontal has increased from previous, most pronounced at the left. Findings could reflect increased edema and/or nonenhancing tumor, and are concerning for disease progression. 2. Evidence for subependymal spread of disease, similar to previous. Ventriculomegaly is unchanged from prior. Electronically Signed   By: Jeannine Boga M.D.   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  Brain MRI demonstrates stable enhancing mass, with increased edema within suspected seizure focus within left frontal lobe.    We made following recommendations: -Begin second AED, Lamictal.  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  All questions were answered. The patient knows to call the clinic with any problems, questions or concerns. No barriers to learning were detected.  I have spent a total of 40 minutes of face-to-face and non-face-to-face time, excluding clinical staff time, preparing to see patient, ordering tests and/or medications, counseling the patient, and independently interpreting results and communicating results to the patient/family/caregiver    ZachaVentura SellersMedical Director of Neuro-Oncology [facility]leMarshall4/21 11:35 AM

## 2020-09-10 ENCOUNTER — Telehealth: Payer: Self-pay

## 2020-09-10 NOTE — Telephone Encounter (Signed)
The Patients Mother called to report to Dr. Barbaraann Cao that the Patient fell last night without injury, is unable to lift her feet up, and is having trouble walking. The Mother reports that the Patients symptoms have worsened after taking Lamictal 100 mg BID. Dr. Barbaraann Cao is not here today but I have sent him a message to make him aware. I spoke with Marga Hoots, PA and he advised me to inform the Pts Mother to take the Pt to the ED if her symptoms worsened for any reason. Pts Mother verbalized understanding.

## 2020-09-11 ENCOUNTER — Other Ambulatory Visit: Payer: Self-pay

## 2020-09-11 ENCOUNTER — Telehealth: Payer: Self-pay | Admitting: Internal Medicine

## 2020-09-11 ENCOUNTER — Inpatient Hospital Stay (HOSPITAL_BASED_OUTPATIENT_CLINIC_OR_DEPARTMENT_OTHER): Payer: BC Managed Care – PPO | Admitting: Internal Medicine

## 2020-09-11 VITALS — BP 145/68 | HR 59 | Temp 97.0°F | Resp 18 | Ht 67.0 in | Wt 150.7 lb

## 2020-09-11 DIAGNOSIS — C719 Malignant neoplasm of brain, unspecified: Secondary | ICD-10-CM | POA: Diagnosis not present

## 2020-09-11 DIAGNOSIS — R569 Unspecified convulsions: Secondary | ICD-10-CM

## 2020-09-11 DIAGNOSIS — C711 Malignant neoplasm of frontal lobe: Secondary | ICD-10-CM | POA: Diagnosis not present

## 2020-09-11 DIAGNOSIS — R270 Ataxia, unspecified: Secondary | ICD-10-CM | POA: Diagnosis not present

## 2020-09-11 MED ORDER — DEXAMETHASONE 4 MG PO TABS: 4.0000 mg | ORAL_TABLET | Freq: Every day | ORAL | 1 refills | Status: DC

## 2020-09-11 MED ORDER — LAMOTRIGINE 25 MG PO TABS: 50.0000 mg | ORAL_TABLET | Freq: Every day | ORAL | 1 refills | Status: DC

## 2020-09-11 NOTE — Progress Notes (Addendum)
[place] at [place], [place] [address] [phone]   Interval Evaluation  Date of Service: 09/11/20 Patient Name: Alicia Mcdonald Patient MRN: [id number] Patient DOB: 14-Apr-1956 Provider: Ventura Sellers, MD  Identifying Statement:  Alicia Mcdonald is a 64 y.o. female with bifrontal, callosal high grade glioma   Oncologic History: Oncology History  High grade glioma not classifiable by WHO criteria ([place])  02/06/2020 Surgery   Stereotactic biopsy by Dr. Marcello Moores   02/26/2020 - 02/26/2020 Chemotherapy   The patient had temozolomide (TEMODAR) 20 MG capsule, 20 mg (100 % of original dose 20 mg), Oral, Daily, 1 of 1 cycle, Start date: 02/26/2020, End date: 05/20/2020 Dose modification: 20 mg (original dose 20 mg, Cycle 1) temozolomide (TEMODAR) 100 MG capsule, 100 mg (100 % of original dose 100 mg), Oral, Daily, 1 of 1 cycle, Start date: 02/26/2020, End date: 05/20/2020 Dose modification: 100 mg (original dose 100 mg, Cycle 1)  for chemotherapy treatment.    05/20/2020 -  Chemotherapy   The patient had temozolomide (TEMODAR) 250 MG capsule, 250 mg (1250 % of original dose 20 mg), Oral, Daily, 1 of 1 cycle, Start date: 06/23/2020, End date: -- Dose modification: 20 mg (original dose 20 mg, Cycle 1), 250 mg (original dose 20 mg, Cycle 1) temozolomide (TEMODAR) 100 MG capsule, 100 mg, Oral, Daily, 1 of 1 cycle, Start date: 07/28/2020, End date: -- Dose modification: 100 mg (original dose 100 mg, Cycle 1) temozolomide (TEMODAR) 250 MG capsule, 250 mg, Oral, Daily, 1 of 1 cycle, Start date: 07/28/2020, End date: -- temozolomide (TEMODAR) 140 MG capsule, 140 mg (100 % of original dose 140 mg), Oral, Daily, 1 of 1 cycle, Start date: 05/20/2020, End date: -- Dose modification: 140 mg (original dose 140 mg, Cycle 1)  for chemotherapy treatment.      Biomarkers:  MGMT Unknown.  IDH 1/2 Wild type.  EGFR Unknown  TERT Unknown   Interval History:   Alicia Mcdonald presents today for follow up after recent clinical changes.  Patient and family members today describe worsening balance, gait problems.  She needs some assistance now getting around the home, usually holding on to others.  This has worsened in recent days as Lamictal was increased to 13m twice per day.  Cognitive changes and hallucinations have acutally improved somewhat with the Lamictal. No additional seizures noted.   H+P (02/25/20) Patient presented to medical attention in May 2021 with sudden onset confusion and disorientation.  She was found by her mother in this state after being seen normal shortly prior.  Apparently had a clinical seizure on the way to ED or in the ED.  CNS imaging demonstrated enhancing mass within corpus callosum and fornix, extending into frontal lobes bilaterally.  She underwent biopsy and ventriculostomy placement on 5/26 with Dr. TMarcello Moores [facility] ungradable IDH-1 wild type glioma.  By several days post-op she had returned to baseline funtional status, fully intact without complaint.  Today she denies any neurologic deficits aside from some memory impairment.    Medications: Current Outpatient Medications on File Prior to Visit  Medication Sig Dispense Refill  . cholecalciferol (VITAMIN D) 1000 UNITS tablet Take 1,000 Units by mouth daily.    .Marland Kitchendexamethasone (DECADRON) 4 MG tablet Take 1 tablet (4 mg total) by mouth daily. 30 tablet 0  . lamoTRIgine (LAMICTAL) 100 MG tablet Starting 12/19, take 1054mdaily x5  days, then take 1104m BID 90 tablet 1  . levETIRAcetam (KEPPRA) 500 MG tablet Take 1 tablet (500 mg total) by mouth 2 (two) times daily. 60 tablet 5  . LORazepam (ATIVAN) 2 MG tablet Take 1 tablet (2 mg total) by mouth every 8 (eight) hours as needed for seizure. 12 tablet 0  . ondansetron (ZOFRAN) 8 MG tablet Take 1 tablet (8 mg total) by mouth 2 (two) times daily as needed (nausea and vomiting). May  take 30-60 minutes prior to Temodar administration if nausea/vomiting occurs. 30 tablet 1  . temozolomide (TEMODAR) 100 MG capsule Take 1 capsule (100 mg total) by mouth daily. May take on an empty stomach to decrease nausea & vomiting. 5 capsule 0  . lamoTRIgine (LAMICTAL) 25 MG tablet Take 2 tablets (50 mg total) by mouth daily for 5 days. 10 tablet 0  . temozolomide (TEMODAR) 250 MG capsule Take 1 capsule (250 mg total) by mouth daily. May take on an empty stomach to decrease nausea & vomiting. (Patient not taking: No sig reported) 5 capsule 0   No current facility-administered medications on file prior to visit.    Allergies:  Allergies  Allergen Reactions  . Tylenol [Acetaminophen] Other (See Comments)    Blood in urine with excessive use   Past Medical History:  Past Medical History:  Diagnosis Date  . Cancer ([place]   . Iron deficiency   . Migraine   . Vitamin D deficiency    Past Surgical History:  Past Surgical History:  Procedure Laterality Date  . APPLICATION OF CRANIAL NAVIGATION N/A 02/06/2020   Procedure: APPLICATION OF CRANIAL NAVIGATION;  Surgeon: TVallarie Mare MD;  [facility]  Service: Neurosurgery;  Laterality: N/A;  . COLONOSCOPY N/A 04/24/2013   Procedure: COLONOSCOPY;  Surgeon: SDanie Binder MD;  [facility];  Service: Endoscopy;  Laterality: N/A;  1:45  . FRAMELESS  BIOPSY WITH BRAINLAB N/A 02/06/2020   Procedure: FRAMELESS BIOPSY WITH BLucky Rathke  Surgeon: TVallarie Mare MD;  [place] [facility]  Service: Neurosurgery;  Laterality: N/A;  . [place] VENTRICULOSTOMY N/A 02/06/2020   Procedure: VENTRICULOSTOMY AND ENDOSCOPIC SEPTOSTOMY;  Surgeon: TVallarie Mare MD;  [place]  Service: Neurosurgery;  Laterality: N/A;   Social History:  Social History   Socioeconomic History  . Marital status: Single    Spouse name: Not on file  . Number of children: Not on file  . Years of education: Not on file  .  Highest education level: Not on file  Occupational History  . 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 Smoking status: Never Smoker  . Smokeless tobacco: Never Used  Substance and Sexual Activity  . Alcohol use: Yes    Comment: once a month, rare  . Drug use: No  . Sexual activity: Not on file  Other Topics Concern  . Not on file  Social History Narrative  . Not on file   Social Determinants of Health   Financial Resource Strain: Not on file  Food Insecurity: Not on file  Transportation Needs: Not on file  Physical Activity: Not on file  Stress: Not on file  Social Connections: Not on file  Intimate Partner Violence: Not on file   Family History:  Family History  Problem Relation Age of Onset  . Colon cancer Neg Hx     Review of  Systems: Constitutional: Doesn't report fevers, chills or abnormal weight loss Eyes: Doesn't report blurriness of vision Ears, nose, mouth, throat, and face: Doesn't report sore throat Respiratory: Doesn't report cough, dyspnea or wheezes Cardiovascular: Doesn't report palpitation, chest discomfort  Gastrointestinal:  Doesn't report nausea, constipation, diarrhea GU: Doesn't report incontinence Skin: Doesn't report skin rashes Neurological: Per HPI Musculoskeletal: Doesn't report joint pain Behavioral/Psych: Doesn't report anxiety  Physical Exam: Vitals:   09/11/20 1212  BP: (!) 145/68  Pulse: (!) 59  Resp: 18  Temp: (!) 97 F (36.1 C)  SpO2: 99%   KPS: 70. General: Alert, cooperative, pleasant, in no acute distress Head: Normal EENT: No conjunctival injection or scleral icterus.  Lungs: Resp effort normal Cardiac: Regular rate Abdomen: Non-distended abdomen Skin: No rashes cyanosis or petechiae. Extremities: No clubbing or edema  Neurologic Exam: Mental Status: Awake, alert, attentive to examiner. Oriented to self and environment.  Language is fluent with intact comprehension.  Disorganized thought and speech patterns Cranial Nerves: Visual acuity is grossly normal. Visual fields are full. Extra-ocular movements intact. No ptosis. Face is symmetric Motor: Tone and bulk are normal. Power is full in both arms and legs. Reflexes are symmetric, no pathologic reflexes present.  Sensory: Intact to light touch Gait: Dystaxic, wide based   Labs: I have reviewed the data as listed    Component Value Date/Time   NA 141 08/26/2020 1110   K 3.7 08/26/2020 1110   CL 102 08/26/2020 1110   CO2 32 08/26/2020 1110   GLUCOSE 127 (H) 08/26/2020 1110   BUN 16 08/26/2020 1110   CREATININE 0.83 08/26/2020 1110   CALCIUM 9.2 08/26/2020 1110   PROT 6.4 (L) 08/26/2020 1110   ALBUMIN 3.3 (L) 08/26/2020 1110   AST 11 (L) 08/26/2020 1110   ALT 19 08/26/2020 1110   ALKPHOS 66 08/26/2020 1110   BILITOT 0.4 08/26/2020 1110   GFRNONAA >60 08/26/2020 1110   GFRAA >60 04/15/2020 1006   Lab Results  Component Value Date   WBC 6.8 08/26/2020   NEUTROABS 5.4 08/26/2020   HGB 12.0 08/26/2020   HCT 36.2 08/26/2020   MCV 94.5 08/26/2020   PLT 188 08/26/2020   Assessment/Plan High grade glioma not classifiable by WHO criteria ([place]) [C71.9]  Alicia Mcdonald demonstrates clinical changes today with regards to her gait.  There is a temporal association with dosing of Lamictal.  Notably, other neurologic complaints such as cognitive/behavioral impairment have not concurrently worsened, and may even be improved.  This leads [place] to suspect dystaxia as side effect of Lamictal, which is well established as with other AEDs.  There is no clinical/objective evidence of steroid myopathy at this time.  We made following recommendations: -Decrease Lamictal to 33m daily -Continue Keppra 5066mBID for now -Con't Decadron 13m65maily, for now -Continue to withhold next cycle of Temodar  Referral for home PT/OT will be arranged as well.  Will give her a  call in 7 days to re-assess candidacy for cycle #4 of 5-day TMZ, or other interventions such as Avastin.  All questions were answered. The patient knows to call the clinic with any problems, questions or concerns. No barriers to learning were detected.  I have spent a total of 30 minutes of face-to-face and non-face-to-face time, excluding clinical staff time, preparing to see patient, ordering tests and/or medications, counseling the patient, and independently interpreting results and communicating results to the patient/family/caregiver    ZacVentura SellersD Medical Director of Neuro-Oncology [facility]  at [place] 09/11/20 12:26 PM

## 2020-09-11 NOTE — Addendum Note (Signed)
Addended by: Henreitta Leber on: 09/11/2020 03:22 PM   Modules accepted: Orders

## 2020-09-11 NOTE — Telephone Encounter (Signed)
Left message with follow-up appointment per 12/30 los. Gave option to call back to reschedule if needed.

## 2020-09-15 ENCOUNTER — Telehealth: Payer: Self-pay | Admitting: *Deleted

## 2020-09-15 NOTE — Telephone Encounter (Signed)
Pending response from [facility] to see if they can accept patient for PT in the home.

## 2020-09-16 ENCOUNTER — Telehealth: Payer: Self-pay

## 2020-09-16 NOTE — Telephone Encounter (Signed)
Faxed over requests for PT to the following Home Health agencies and requested that they please respond:     [facility]: [phone]) [phone]  [facility]: (F) [phone]  [facility]: [phone]  [facility]: (F) [phone]

## 2020-09-18 ENCOUNTER — Other Ambulatory Visit: Payer: Self-pay

## 2020-09-18 ENCOUNTER — Inpatient Hospital Stay: Payer: BC Managed Care – PPO | Attending: Internal Medicine | Admitting: Internal Medicine

## 2020-09-18 ENCOUNTER — Other Ambulatory Visit: Payer: Self-pay | Admitting: Internal Medicine

## 2020-09-18 DIAGNOSIS — C719 Malignant neoplasm of brain, unspecified: Secondary | ICD-10-CM

## 2020-09-18 MED ORDER — TEMOZOLOMIDE 250 MG PO CAPS: 250.0000 mg | ORAL_CAPSULE | Freq: Every day | ORAL | 0 refills | Status: DC

## 2020-09-18 MED ORDER — TEMOZOLOMIDE 100 MG PO CAPS: 100.0000 mg | ORAL_CAPSULE | Freq: Every day | ORAL | 0 refills | Status: DC

## 2020-09-18 MED FILL — TEMOZOLOMIDE 100 MG CAPS: 100 | 5 days supply | Qty: 5 | Fill #0

## 2020-09-18 MED FILL — TEMOZOLOMIDE 250 MG CAPS: 250 | 5 days supply | Qty: 5 | Fill #0

## 2020-09-18 NOTE — Progress Notes (Signed)
I connected with Alicia Mcdonald on 09/18/20 at 11:30 AM EST by telephone visit and verified that I am speaking with the correct person using two identifiers.  I discussed the limitations, risks, security and privacy concerns of performing an evaluation and management service by telemedicine and the availability of in-person appointments. I also discussed with the patient that there may be a patient responsible charge related to this service. The patient expressed understanding and agreed to proceed.  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  Mother describes improvement in cognition as well.  She feels she is roughly at her prior baseline, at this time.  Currently dosing Lamictal 50mg  daily, decadron 4mg  daily. 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  Will recommend continuing Lamictal 50mg  daily, Keppra 500mg  BID, and decadron 4mg  daily.    We recommended continuing treatment with cycle #4 Temozolomide 200 mg/m2, on for five days and off for twenty three days in twenty eight day cycles. The patient will have a complete blood count performed on days 21 and 28 of each cycle, and a comprehensive metabolic panel performed on day 28 of each cycle. Labs may need to be performed more often. Zofran will prescribed for home use for nausea/vomiting.   Chemotherapy should be  held for the following:  ANC less than 1,000  Platelets less than 100,000  LFT or creatinine greater than 2x ULN  If clinical concerns/contraindications develop  Follow Up Instructions:  She will return to clinic in 1 month with labs for evaluation prior to cycle #5.  I discussed the assessment and treatment plan with the patient.  The patient was provided an opportunity to ask questions and all were answered.  The patient agreed with the plan and demonstrated understanding of the instructions.    The patient was advised to call back or seek an in-person evaluation if the symptoms worsen or if the condition fails to improve as anticipated.  I provided 5-10 minutes of non-face-to-face time during this enocunter.  , MD   I provided 15 minutes of non face-to-face telephone visit time during this encounter, and > 50% was spent counseling as documented under my assessment & plan.

## 2020-09-19 ENCOUNTER — Encounter: Payer: Self-pay | Admitting: Internal Medicine

## 2020-09-22 ENCOUNTER — Telehealth: Payer: Self-pay

## 2020-09-22 NOTE — Telephone Encounter (Signed)
PT from [facility] called in to request PT appt order. Order requested : PT 1 x per week for 7 weeks. Verbal OK given.

## 2020-10-07 ENCOUNTER — Other Ambulatory Visit: Payer: Self-pay | Admitting: Internal Medicine

## 2020-10-21 ENCOUNTER — Inpatient Hospital Stay: Payer: BC Managed Care – PPO | Attending: Internal Medicine

## 2020-10-21 ENCOUNTER — Other Ambulatory Visit: Payer: Self-pay

## 2020-10-21 ENCOUNTER — Inpatient Hospital Stay: Payer: BC Managed Care – PPO | Admitting: Internal Medicine

## 2020-10-21 ENCOUNTER — Other Ambulatory Visit: Payer: Self-pay | Admitting: Internal Medicine

## 2020-10-21 VITALS — HR 66 | Temp 97.4°F | Resp 17 | Ht 67.0 in | Wt 154.0 lb

## 2020-10-21 DIAGNOSIS — C719 Malignant neoplasm of brain, unspecified: Secondary | ICD-10-CM

## 2020-10-21 DIAGNOSIS — C711 Malignant neoplasm of frontal lobe: Secondary | ICD-10-CM | POA: Diagnosis present

## 2020-10-21 DIAGNOSIS — Z85820 Personal history of malignant melanoma of skin: Secondary | ICD-10-CM | POA: Diagnosis not present

## 2020-10-21 DIAGNOSIS — R569 Unspecified convulsions: Secondary | ICD-10-CM

## 2020-10-21 DIAGNOSIS — Z7952 Long term (current) use of systemic steroids: Secondary | ICD-10-CM | POA: Diagnosis not present

## 2020-10-21 DIAGNOSIS — Z79899 Other long term (current) drug therapy: Secondary | ICD-10-CM | POA: Insufficient documentation

## 2020-10-21 LAB — CMP (CANCER CENTER ONLY)
ALT: 25 U/L (ref 0–44)
AST: 16 U/L (ref 15–41)
Albumin: 3.7 g/dL (ref 3.5–5.0)
Alkaline Phosphatase: 56 U/L (ref 38–126)
Anion gap: 9 (ref 5–15)
BUN: 16 mg/dL (ref 8–23)
CO2: 29 mmol/L (ref 22–32)
Calcium: 9.3 mg/dL (ref 8.9–10.3)
Chloride: 102 mmol/L (ref 98–111)
Creatinine: 0.88 mg/dL (ref 0.44–1.00)
GFR, Estimated: >60 mL/min (ref >60)
Glucose, Bld: 175 mg/dL — ABNORMAL HIGH (ref 70–99)
Potassium: 3.4 mmol/L — ABNORMAL LOW (ref 3.5–5.1)
Sodium: 140 mmol/L (ref 135–145)
Total Bilirubin: 0.3 mg/dL (ref 0.3–1.2)
Total Protein: 6.7 g/dL (ref 6.5–8.1)

## 2020-10-21 LAB — CBC WITH DIFFERENTIAL (CANCER CENTER ONLY)
Abs Immature Granulocytes: 0.03 10*3/uL (ref 0.00–0.07)
Basophils Absolute: 0 10*3/uL (ref 0.0–0.1)
Basophils Relative: 0 %
Eosinophils Absolute: 0 10*3/uL (ref 0.0–0.5)
Eosinophils Relative: 0 %
HCT: 38.7 % (ref 36.0–46.0)
Hemoglobin: 12.7 g/dL (ref 12.0–15.0)
Immature Granulocytes: 1 %
Lymphocytes Relative: 10 %
Lymphs Abs: 0.6 10*3/uL — ABNORMAL LOW (ref 0.7–4.0)
MCH: 32.1 pg (ref 26.0–34.0)
MCHC: 32.8 g/dL (ref 30.0–36.0)
MCV: 97.7 fL (ref 80.0–100.0)
Monocytes Absolute: 0.3 10*3/uL (ref 0.1–1.0)
Monocytes Relative: 5 %
Neutro Abs: 4.7 10*3/uL (ref 1.7–7.7)
Neutrophils Relative %: 84 %
Platelet Count: 147 10*3/uL — ABNORMAL LOW (ref 150–400)
RBC: 3.96 MIL/uL (ref 3.87–5.11)
RDW: 13.4 % (ref 11.5–15.5)
WBC Count: 5.6 10*3/uL (ref 4.0–10.5)
nRBC: 0 % (ref 0.0–0.2)

## 2020-10-21 MED ORDER — TEMOZOLOMIDE 140 MG PO CAPS: 150.0000 mg/m2/d | ORAL_CAPSULE | Freq: Every day | ORAL | 0 refills | Status: DC

## 2020-10-21 NOTE — Progress Notes (Signed)
[facility] at [place], [place] [address] [phone]   Interval Evaluation  Date of Service: 10/21/20 Patient Name: Alicia Mcdonald Patient MRN: [id number] Patient DOB: 01-31-1956 Provider: Ventura Sellers, MD  Identifying Statement:  [name] is a 65 y.o. female with bifrontal, callosal high grade glioma   Oncologic History: Oncology History  High grade glioma not classifiable by WHO criteria ([place])  02/06/2020 Surgery   Stereotactic biopsy by Dr. Marcello Moores   02/26/2020 - 02/26/2020 Chemotherapy   The patient had temozolomide (TEMODAR) 20 MG capsule, 20 mg (100 % of original dose 20 mg), Oral, Daily, 1 of 1 cycle, Start date: 02/26/2020, End date: 05/20/2020 Dose modification: 20 mg (original dose 20 mg, Cycle 1) temozolomide (TEMODAR) 100 MG capsule, 100 mg (100 % of original dose 100 mg), Oral, Daily, 1 of 1 cycle, Start date: 02/26/2020, End date: 05/20/2020 Dose modification: 100 mg (original dose 100 mg, Cycle 1)  for chemotherapy treatment.    05/20/2020 -  Chemotherapy   The patient had temozolomide (TEMODAR) 250 MG capsule, 250 mg (1250 % of original dose 20 mg), Oral, Daily, 1 of 1 cycle, Start date: 06/23/2020, End date: -- Dose modification: 20 mg (original dose 20 mg, Cycle 1), 250 mg (original dose 20 mg, Cycle 1) temozolomide (TEMODAR) 100 MG capsule, 100 mg, Oral, Daily, 1 of 1 cycle, Start date: 09/18/2020, End date: -- Dose modification: 100 mg (original dose 100 mg, Cycle 1) temozolomide (TEMODAR) 250 MG capsule, 250 mg, Oral, Daily, 1 of 1 cycle, Start date: 09/18/2020, End date: -- temozolomide (TEMODAR) 140 MG capsule, 140 mg (100 % of original dose 140 mg), Oral, Daily, 1 of 1 cycle, Start date: 05/20/2020, End date: 08/26/2020 Dose modification: 140 mg (original dose 140 mg, Cycle 1)  for chemotherapy treatment.      Biomarkers:  MGMT Unknown.  IDH 1/2 Wild type.  EGFR Unknown  TERT Unknown   Interval  History:  LINNETTE Mcdonald presents today for follow up after cycle #4 of Temozolomide.  She did feel quite sick and tired towards the end of her week of chemo.  Otherwise no new or progressive neurologic deficits noted.  Gait is independent, cognition is mostly clear aside from ongoing short term memory impairment.  Lamictal and Keppra unchanged, decadron dose currently 46m daily.  No additional seizures noted.   Decadron: 09/11/20: 478m 10/28/20: 16m32mH+P (02/25/20) Patient presented to medical attention in May 2021 with sudden onset confusion and disorientation.  She was found by her mother in this state after being seen normal shortly prior.  Apparently had a clinical seizure on the way to ED or in the ED.  CNS imaging demonstrated enhancing mass within corpus callosum and fornix, extending into frontal lobes bilaterally.  She underwent biopsy and ventriculostomy placement on 5/26 with Dr. ThoMarcello Mooresath [facility] ungradable IDH-1 wild type glioma.  By several days post-op she had returned to baseline funtional status, fully intact without complaint.  Today she denies any neurologic deficits aside from some memory impairment.    Medications: Current Outpatient Medications on File Prior to Visit  Medication Sig Dispense Refill  . cholecalciferol (VITAMIN D) 1000 UNITS tablet Take 1,000 Units by mouth daily.    . dMarland Kitchenxamethasone (DECADRON) 4 MG tablet TAKE (1) TABLET BY MOUTH ONCE DAILY. 30 tablet 0  . lamoTRIgine (LAMICTAL) 25 MG tablet TAKE 2 TABLETS  BY MOUTH DAILY. 60 tablet 0  . levETIRAcetam (KEPPRA) 500 MG tablet Take 1 tablet (500 mg total) by mouth 2 (two) times daily. 60 tablet 5  . ondansetron (ZOFRAN) 8 MG tablet Take 1 tablet (8 mg total) by mouth 2 (two) times daily as needed (nausea and vomiting). May take 30-60 minutes prior to Temodar administration if nausea/vomiting occurs. 30 tablet 1  . LORazepam (ATIVAN) 2 MG tablet Take 1 tablet (2 mg  total) by mouth every 8 (eight) hours as needed for seizure. (Patient not taking: Reported on 10/21/2020) 12 tablet 0  . temozolomide (TEMODAR) 100 MG capsule Take 1 capsule (100 mg total) by mouth daily. May take on an empty stomach to decrease nausea & vomiting. 5 capsule 0  . temozolomide (TEMODAR) 100 MG capsule Take 1 capsule (100 mg total) by mouth daily. May take on an empty stomach to decrease nausea & vomiting. 5 capsule 0  . temozolomide (TEMODAR) 250 MG capsule Take 1 capsule (250 mg total) by mouth daily. May take on an empty stomach to decrease nausea & vomiting. (Patient not taking: No sig reported) 5 capsule 0  . temozolomide (TEMODAR) 250 MG capsule Take 1 capsule (250 mg total) by mouth daily. May take on an empty stomach to decrease nausea & vomiting. 5 capsule 0   No current facility-administered medications on file prior to visit.    Allergies:  Allergies  Allergen Reactions  . Tylenol [Acetaminophen] Other (See Comments)    Blood in urine with excessive use   Past Medical History:  Past Medical History:  Diagnosis Date  . Cancer ([place])   . Iron deficiency   . Migraine   . Vitamin D deficiency    Past Surgical History:  Past Surgical History:  Procedure Laterality Date  . APPLICATION OF CRANIAL NAVIGATION N/A 02/06/2020   Procedure: APPLICATION OF CRANIAL NAVIGATION;  Surgeon: Vallarie Mare, MD;  Location: [facility];  Service: Neurosurgery;  Laterality: N/A;  . COLONOSCOPY N/A 04/24/2013   Procedure: COLONOSCOPY;  Surgeon: Danie Binder, MD;  [facility];  Service: Endoscopy;  Laterality: N/A;  1:45  . FRAMELESS  BIOPSY WITH BRAINLAB N/A 02/06/2020   Procedure: FRAMELESS BIOPSY WITH Lucky Rathke;  Surgeon: Vallarie Mare, MD;  Location: [facility];  Service: Neurosurgery;  Laterality: N/A;  . [place]  . VENTRICULOSTOMY N/A 02/06/2020   Procedure: VENTRICULOSTOMY AND ENDOSCOPIC SEPTOSTOMY;  Surgeon: Vallarie Mare, MD;  Location: [facility];   Service: Neurosurgery;  Laterality: N/A;   Social History:  Social History   Socioeconomic History  . Marital status: Single    Spouse name: Not on file  . Number of children: Not on file  . Years of education: Not on file  . Highest education level: Not on file  Occupational History  . 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 Smoking status: Never Smoker  . Smokeless tobacco: Never Used  Substance and Sexual Activity  . Alcohol use: Yes    Comment: once a month, rare  . Drug use: No  . Sexual activity: Not on file  Other Topics Concern  . Not on file  Social History Narrative  . Not on file   Social Determinants of Health   Financial Resource Strain: Not on file  Food Insecurity: Not on file  Transportation Needs: Not on file  Physical Activity: Not on file  Stress: Not on file  Social Connections: Not on file  Intimate Partner Violence: Not on file   Family History:  Family History  Problem Relation Age of Onset  . Colon cancer Neg Hx     Review of Systems: Constitutional: Doesn't report fevers, chills or abnormal weight loss Eyes: Doesn't report blurriness of vision Ears, nose, mouth, throat, and face: Doesn't report sore throat Respiratory: Doesn't report cough, dyspnea or wheezes Cardiovascular: Doesn't report palpitation, chest discomfort  Gastrointestinal:  Doesn't report nausea, constipation, diarrhea GU: Doesn't report incontinence Skin: Doesn't report skin rashes Neurological: Per HPI Musculoskeletal: Doesn't report joint pain Behavioral/Psych: Doesn't report anxiety  Physical Exam: Vitals:   10/21/20 1206  Pulse: 66  Resp: 17  Temp: (!) 97.4 F (36.3 C)  SpO2: 99%   KPS: 80. General: Alert, cooperative, pleasant, in no acute distress Head: Normal EENT: No conjunctival injection or scleral icterus.  Lungs: Resp effort normal Cardiac: Regular  rate Abdomen: Non-distended abdomen Skin: No rashes cyanosis or petechiae. Extremities: No clubbing or edema  Neurologic Exam: Mental Status: Awake, alert, attentive to examiner. Oriented to self and environment. Language is fluent with intact comprehension.   Cranial Nerves: Visual acuity is grossly normal. Visual fields are full. Extra-ocular movements intact. No ptosis. Face is symmetric Motor: Tone and bulk are normal. Power is full in both arms and legs. Reflexes are symmetric, no pathologic reflexes present.  Sensory: Intact to light touch Gait: Independent  Labs: I have reviewed the data as listed    Component Value Date/Time   NA 140 10/21/2020 1148   K 3.4 (L) 10/21/2020 1148   CL 102 10/21/2020 1148   CO2 29 10/21/2020 1148   GLUCOSE 175 (H) 10/21/2020 1148   BUN 16 10/21/2020 1148   CREATININE 0.88 10/21/2020 1148   CALCIUM 9.3 10/21/2020 1148   PROT 6.7 10/21/2020 1148   ALBUMIN 3.7 10/21/2020 1148   AST 16 10/21/2020 1148   ALT 25 10/21/2020 1148   ALKPHOS 56 10/21/2020 1148   BILITOT 0.3 10/21/2020 1148   GFRNONAA >60 10/21/2020 1148   GFRAA >60 04/15/2020 1006   Lab Results  Component Value Date   WBC 5.6 10/21/2020   NEUTROABS 4.7 10/21/2020   HGB 12.7 10/21/2020   HCT 38.7 10/21/2020   MCV 97.7 10/21/2020   PLT 147 (L) 10/21/2020   Assessment/Plan High grade glioma not classifiable by WHO criteria ([place]) [C71.9]  AERITH CANAL is clinically stable today.    We recommended continuing treatment with cycle #5 Temozolomide, dose reduced to 150 mg/m2, on for five days and off for twenty three days in twenty eight day cycles. The patient will have a complete blood count performed on days 21 and 28 of each cycle, and a comprehensive metabolic panel performed on day 28 of each cycle. Labs may need to be performed more often. Zofran will prescribed for home use for nausea/vomiting.   Chemotherapy should be held for the following:  ANC less than 1,000   Platelets less than 100,000  LFT or creatinine greater than 2x ULN  If clinical concerns/contraindications develop  Decadron should be decreased to 46m daily if tolerated.  We ask that FCHALEE HIROTAreturn to clinic in 1 months following next brain MRI prior to cycle #6.  All questions were answered. The patient knows to call the clinic with any problems, questions or concerns. No barriers to learning were detected.  I have spent a total of 30 minutes of face-to-face and non-face-to-face time,  excluding clinical staff time, preparing to see patient, ordering tests and/or medications, counseling the patient, and independently interpreting results and communicating results to the patient/family/caregiver    Ventura Sellers, MD Medical Director of Neuro-Oncology [facility] at [place] 10/21/20 12:41 PM

## 2020-10-27 MED FILL — TEMOZOLOMIDE 140 MG CAPS: 140 | 5 days supply | Qty: 10 | Fill #0

## 2020-10-27 MED FILL — ONDANSETRON HCL 8 MG TABLET: 8 | 21 days supply | Qty: 18 | Fill #1

## 2020-11-03 ENCOUNTER — Encounter: Payer: Self-pay | Admitting: Internal Medicine

## 2020-11-03 MED ORDER — DEXAMETHASONE 1 MG PO TABS: 3.0000 mg | ORAL_TABLET | Freq: Every day | ORAL | 1 refills | Status: DC

## 2020-11-05 ENCOUNTER — Telehealth: Payer: Self-pay | Admitting: Internal Medicine

## 2020-11-05 NOTE — Telephone Encounter (Signed)
Scheduled per 02/08 los, patient has been called and notified of upcoming appointment.

## 2020-11-13 ENCOUNTER — Other Ambulatory Visit: Payer: Self-pay | Admitting: Radiation Therapy

## 2020-11-14 ENCOUNTER — Other Ambulatory Visit: Payer: Self-pay

## 2020-11-14 ENCOUNTER — Ambulatory Visit (HOSPITAL_COMMUNITY)
Admission: RE | Admit: 2020-11-14 | Discharge: 2020-11-14 | Disposition: A | Discharge status: Home / Self Care | Payer: Medicare PPO | Source: Ambulatory Visit | Attending: Internal Medicine | Admitting: Internal Medicine

## 2020-11-14 DIAGNOSIS — C719 Malignant neoplasm of brain, unspecified: Secondary | ICD-10-CM | POA: Insufficient documentation

## 2020-11-14 MED ORDER — GADOBUTROL 1 MMOL/ML IV SOLN
7.0000 mL | Freq: Once | INTRAVENOUS | Status: AC | PRN
  Administered 2020-11-14: 7 mL via INTRAVENOUS

## 2020-11-17 ENCOUNTER — Inpatient Hospital Stay: Payer: Medicare PPO | Attending: Internal Medicine

## 2020-11-17 DIAGNOSIS — C711 Malignant neoplasm of frontal lobe: Secondary | ICD-10-CM | POA: Insufficient documentation

## 2020-11-17 DIAGNOSIS — Z9221 Personal history of antineoplastic chemotherapy: Secondary | ICD-10-CM | POA: Insufficient documentation

## 2020-11-17 DIAGNOSIS — Z79899 Other long term (current) drug therapy: Secondary | ICD-10-CM | POA: Insufficient documentation

## 2020-11-17 DIAGNOSIS — R569 Unspecified convulsions: Secondary | ICD-10-CM | POA: Insufficient documentation

## 2020-11-18 ENCOUNTER — Other Ambulatory Visit: Payer: Self-pay

## 2020-11-18 ENCOUNTER — Inpatient Hospital Stay: Payer: Medicare PPO

## 2020-11-18 ENCOUNTER — Inpatient Hospital Stay: Payer: Medicare PPO | Admitting: Internal Medicine

## 2020-11-18 VITALS — BP 127/85 | HR 67 | Temp 97.1°F | Resp 16 | Ht 67.0 in | Wt 157.2 lb

## 2020-11-18 DIAGNOSIS — C719 Malignant neoplasm of brain, unspecified: Secondary | ICD-10-CM | POA: Diagnosis not present

## 2020-11-18 DIAGNOSIS — R569 Unspecified convulsions: Secondary | ICD-10-CM

## 2020-11-18 DIAGNOSIS — C711 Malignant neoplasm of frontal lobe: Secondary | ICD-10-CM | POA: Diagnosis present

## 2020-11-18 DIAGNOSIS — Z79899 Other long term (current) drug therapy: Secondary | ICD-10-CM | POA: Diagnosis not present

## 2020-11-18 DIAGNOSIS — Z9221 Personal history of antineoplastic chemotherapy: Secondary | ICD-10-CM | POA: Diagnosis not present

## 2020-11-18 LAB — CBC WITH DIFFERENTIAL (CANCER CENTER ONLY)
Abs Immature Granulocytes: 0.04 10*3/uL (ref 0.00–0.07)
Basophils Absolute: 0 10*3/uL (ref 0.0–0.1)
Basophils Relative: 0 %
Eosinophils Absolute: 0 10*3/uL (ref 0.0–0.5)
Eosinophils Relative: 1 %
HCT: 38.6 % (ref 36.0–46.0)
Hemoglobin: 12.8 g/dL (ref 12.0–15.0)
Immature Granulocytes: 1 %
Lymphocytes Relative: 14 %
Lymphs Abs: 0.8 10*3/uL (ref 0.7–4.0)
MCH: 32.5 pg (ref 26.0–34.0)
MCHC: 33.2 g/dL (ref 30.0–36.0)
MCV: 98 fL (ref 80.0–100.0)
Monocytes Absolute: 0.4 10*3/uL (ref 0.1–1.0)
Monocytes Relative: 7 %
Neutro Abs: 4.2 10*3/uL (ref 1.7–7.7)
Neutrophils Relative %: 77 %
Platelet Count: 189 10*3/uL (ref 150–400)
RBC: 3.94 MIL/uL (ref 3.87–5.11)
RDW: 12.8 % (ref 11.5–15.5)
WBC Count: 5.5 10*3/uL (ref 4.0–10.5)
nRBC: 0 % (ref 0.0–0.2)

## 2020-11-18 LAB — CMP (CANCER CENTER ONLY)
ALT: 26 U/L (ref 0–44)
AST: 16 U/L (ref 15–41)
Albumin: 3.5 g/dL (ref 3.5–5.0)
Alkaline Phosphatase: 55 U/L (ref 38–126)
Anion gap: 6 (ref 5–15)
BUN: 18 mg/dL (ref 8–23)
CO2: 30 mmol/L (ref 22–32)
Calcium: 9.2 mg/dL (ref 8.9–10.3)
Chloride: 103 mmol/L (ref 98–111)
Creatinine: 0.91 mg/dL (ref 0.44–1.00)
GFR, Estimated: >60 mL/min (ref >60)
Glucose, Bld: 168 mg/dL — ABNORMAL HIGH (ref 70–99)
Potassium: 3.2 mmol/L — ABNORMAL LOW (ref 3.5–5.1)
Sodium: 139 mmol/L (ref 135–145)
Total Bilirubin: 0.4 mg/dL (ref 0.3–1.2)
Total Protein: 6.4 g/dL — ABNORMAL LOW (ref 6.5–8.1)

## 2020-11-18 MED ORDER — LAMOTRIGINE 100 MG PO TABS: 100.0000 mg | ORAL_TABLET | Freq: Two times a day (BID) | ORAL | 3 refills | Status: DC

## 2020-11-18 MED ORDER — DEXAMETHASONE 2 MG PO TABS: 4.0000 mg | ORAL_TABLET | Freq: Two times a day (BID) | ORAL | 1 refills | Status: DC

## 2020-11-18 NOTE — Progress Notes (Signed)
[facility] [place], [place] [address] [phone]   Interval Evaluation  Date of Service: 11/18/20 Patient Name: Alicia Mcdonald Patient MRN: [id number] Patient DOB: [birth year] Provider: Ventura Sellers, MD  Identifying Statement:  Alicia Mcdonald is a 65 y.o. female with bifrontal, callosal high grade glioma   Oncologic History: Oncology History  High grade glioma not classifiable by WHO criteria ([place])  02/06/2020 Surgery   Stereotactic biopsy by Dr. Marcello Moores   02/26/2020 - 02/26/2020 Chemotherapy   The patient had temozolomide (TEMODAR) 20 MG capsule, 20 mg (100 % of original dose 20 mg), Oral, Daily, 1 of 1 cycle, Start date: 02/26/2020, End date: 05/20/2020 Dose modification: 20 mg (original dose 20 mg, Cycle 1) temozolomide (TEMODAR) 100 MG capsule, 100 mg (100 % of original dose 100 mg), Oral, Daily, 1 of 1 cycle, Start date: 02/26/2020, End date: 05/20/2020 Dose modification: 100 mg (original dose 100 mg, Cycle 1)  for chemotherapy treatment.    05/20/2020 -  Chemotherapy    Patient is on Treatment Plan: BRAIN GLIOBLASTOMA CONSOLIDATION TEMOZOLOMIDE DAYS 1-5 Q28 DAYS         Biomarkers:  MGMT Unknown.  IDH 1/2 Wild type.  EGFR Unknown  TERT Unknown   Interval History:  Alicia Mcdonald presents today for follow up after cycle #5 of Temozolomide.  She again did not tolerate chemo well this month; experienced dysfunctional behaviors and nausea per family. They have noticed a decline in her ability to make clear decisions, and at times her gait appears "frozen", although no loss of awareness occurs at these times.  Gait is otherwise independent, continues to be hampered by short term memory impairment.  Lamictal and Keppra unchanged, decadron had been decreased to 58m daily.  No additional seizures noted.   Decadron 09/11/20: 479m 10/28/20: 77m70m/29/22: 2mg60m+P (02/25/20) Patient presented to medical attention in May 2021  with sudden onset confusion and disorientation.  She was found by her mother in this state after being seen normal shortly prior.  Apparently had a clinical seizure on the way to ED or in the ED.  CNS imaging demonstrated enhancing mass within corpus callosum and fornix, extending into frontal lobes bilaterally.  She underwent biopsy and ventriculostomy placement on 5/26 with Dr. ThomMarcello Mooresth [facility] ungradable IDH-1 wild type glioma.  By several days post-op she had returned to baseline funtional status, fully intact without complaint.  Today she denies any neurologic deficits aside from some memory impairment.    Medications: Current Outpatient Medications on File Prior to Visit  Medication Sig Dispense Refill  . cholecalciferol (VITAMIN D) 1000 UNITS tablet Take 1,000 Units by mouth daily.    . deMarland Kitchenamethasone (DECADRON) 1 MG tablet Take 3 tablets (3 mg total) by mouth daily. 90 tablet 1  . lamoTRIgine (LAMICTAL) 25 MG tablet TAKE 2 TABLETS BY MOUTH DAILY. 60 tablet 0  . levETIRAcetam (KEPPRA) 500 MG tablet Take 1 tablet (500 mg total) by mouth 2 (two) times daily. 60 tablet 5  . LORazepam (ATIVAN) 2 MG tablet Take 1 tablet (2 mg total) by mouth every 8 (eight) hours as needed for seizure. (Patient not taking: Reported on 10/21/2020) 12 tablet 0  . ondansetron (ZOFRAN) 8 MG tablet Take 1 tablet (8 mg total) by mouth 2 (two) times daily as needed (nausea and vomiting). May take 30-60 minutes prior to Temodar administration if  nausea/vomiting occurs. 30 tablet 1  . temozolomide (TEMODAR) 100 MG capsule Take 1 capsule (100 mg total) by mouth daily. May take on an empty stomach to decrease nausea & vomiting. 5 capsule 0  . temozolomide (TEMODAR) 100 MG capsule Take 1 capsule (100 mg total) by mouth daily. May take on an empty stomach to decrease nausea & vomiting. 5 capsule 0  . temozolomide (TEMODAR) 140 MG capsule Take 2 capsules (280 mg total) by mouth daily.  May take on an empty stomach to decrease nausea & vomiting. 10 capsule 0  . temozolomide (TEMODAR) 250 MG capsule Take 1 capsule (250 mg total) by mouth daily. May take on an empty stomach to decrease nausea & vomiting. (Patient not taking: No sig reported) 5 capsule 0  . temozolomide (TEMODAR) 250 MG capsule Take 1 capsule (250 mg total) by mouth daily. May take on an empty stomach to decrease nausea & vomiting. 5 capsule 0   No current facility-administered medications on file prior to visit.    Allergies:  Allergies  Allergen Reactions  . Tylenol [Acetaminophen] Other (See Comments)    Blood in urine with excessive use   Past Medical History:  Past Medical History:  Diagnosis Date  . Cancer ([place])   . Iron deficiency   . Migraine   . Vitamin D deficiency    Past Surgical History:  Past Surgical History:  Procedure Laterality Date  . APPLICATION OF CRANIAL NAVIGATION N/A 02/06/2020   Procedure: APPLICATION OF CRANIAL NAVIGATION;  Surgeon: Vallarie Mare, MD;  Location: [facility];  Service: Neurosurgery;  Laterality: N/A;  . COLONOSCOPY N/A 04/24/2013   Procedure: COLONOSCOPY;  Surgeon: Danie Binder, MD;  [facility];  Service: Endoscopy;  Laterality: N/A;  1:45  . FRAMELESS  BIOPSY WITH BRAINLAB N/A 02/06/2020   Procedure: FRAMELESS BIOPSY WITH Lucky Rathke;  Surgeon: Vallarie Mare, MD;  Location: [facility];  Service: Neurosurgery;  Laterality: N/A;  . [place]  . VENTRICULOSTOMY N/A 02/06/2020   Procedure: VENTRICULOSTOMY AND ENDOSCOPIC SEPTOSTOMY;  Surgeon: Vallarie Mare, MD;  Location: [facility];  Service: Neurosurgery;  Laterality: N/A;   Social History:  Social History   Socioeconomic History  . Marital status: Single    Spouse name: Not on file  . Number of children: Not on file  . Years of education: Not on file  . Highest education level: Not on file  Occupational History  . 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 Smoking status: Never Smoker  . Smokeless tobacco: Never Used  Substance and Sexual Activity  . Alcohol use: Yes    Comment: once a month, rare  . Drug use: No  . Sexual activity: Not on file  Other Topics Concern  . Not on file  Social History Narrative  . Not on file   Social Determinants of Health   Financial Resource Strain: Not on file  Food Insecurity: Not on file  Transportation Needs: Not on file  Physical Activity: Not on file  Stress: Not on file  Social Connections: Not on file  Intimate Partner Violence: Not on file   Family History:  Family History  Problem Relation Age of Onset  . Colon cancer Neg Hx     Review of Systems: Constitutional: Doesn't report fevers, chills or abnormal weight loss Eyes: Doesn't report blurriness of vision Ears, nose, mouth, throat, and  face: Doesn't report sore throat Respiratory: Doesn't report cough, dyspnea or wheezes Cardiovascular: Doesn't report palpitation, chest discomfort  Gastrointestinal:  Doesn't report nausea, constipation, diarrhea GU: Doesn't report incontinence Skin: Doesn't report skin rashes Neurological: Per HPI Musculoskeletal: Doesn't report joint pain Behavioral/Psych: Doesn't report anxiety  Physical Exam: Vitals:   11/18/20 1105  BP: 127/85  Pulse: 67  Resp: 16  Temp: (!) 97.1 F (36.2 C)  SpO2: 99%   KPS: 80. General: Alert, cooperative, pleasant, in no acute distress Head: Normal EENT: No conjunctival injection or scleral icterus.  Lungs: Resp effort normal Cardiac: Regular rate Abdomen: Non-distended abdomen Skin: No rashes cyanosis or petechiae. Extremities: No clubbing or edema  Neurologic Exam: Mental Status: Awake, alert, attentive to examiner. Oriented to self and environment. Language is fluent with intact comprehension.   Cranial Nerves: Visual acuity is grossly normal. Visual fields are full.  Extra-ocular movements intact. No ptosis. Face is symmetric Motor: Tone and bulk are normal. Power is full in both arms and legs. Reflexes are symmetric, no pathologic reflexes present.  Sensory: Intact to light touch Gait: Independent  Labs: I have reviewed the data as listed    Component Value Date/Time   NA 140 10/21/2020 1148   K 3.4 (L) 10/21/2020 1148   CL 102 10/21/2020 1148   CO2 29 10/21/2020 1148   GLUCOSE 175 (H) 10/21/2020 1148   BUN 16 10/21/2020 1148   CREATININE 0.88 10/21/2020 1148   CALCIUM 9.3 10/21/2020 1148   PROT 6.7 10/21/2020 1148   ALBUMIN 3.7 10/21/2020 1148   AST 16 10/21/2020 1148   ALT 25 10/21/2020 1148   ALKPHOS 56 10/21/2020 1148   BILITOT 0.3 10/21/2020 1148   GFRNONAA >60 10/21/2020 1148   GFRAA >60 04/15/2020 1006   Lab Results  Component Value Date   WBC 5.5 11/18/2020   NEUTROABS 4.2 11/18/2020   HGB 12.8 11/18/2020   HCT 38.6 11/18/2020   MCV 98.0 11/18/2020   PLT 189 11/18/2020   Imaging:  [facility] Clinician Interpretation: I have personally reviewed the CNS images as listed.  My interpretation, in the context of the patient's clinical presentation, is treatment effect vs true progression  MR BRAIN W WO CONTRAST  Result Date: 11/15/2020 CLINICAL DATA:  High-grade glioma follow-up not classifiable by WHO criteria, on temozolomide EXAM: MRI HEAD WITHOUT AND WITH CONTRAST TECHNIQUE: Multiplanar, multiecho pulse sequences of the brain and surrounding structures were obtained without and with intravenous contrast. CONTRAST:  53m GADAVIST GADOBUTROL 1 MMOL/ML IV SOLN COMPARISON:  08/22/2020 FINDINGS: Brain: Irregular, heterogeneously enhancing necrotic mass is again identified centered at the genu and ventral body of the corpus callosum with involvement of left greater than right frontal lobes, septum pellucidum and fornices, and right anterior superior thalamus. There is enhancement of the ependymal margin the frontal horns. Overall size has  increased particularly in the left frontal region. Representative comparative measurements on axial series 16, image 111 of 2.7 x 2.5 cm versus is 1.9 x 2 cm previously. Extent of abnormal T2 FLAIR hyperintensity has also increased in the left frontal lobe. Increased partial effacement of the left greater than right frontal horns. No midline shift. Similar to slightly increased caliber of the ventricles. No acute infarction. Chronic blood products and possibly mineralization associated with above lesion appear increased. Chronic blood products are also present along prior right ventriculostomy tracts. Vascular: Major vessel flow voids at the skull base are preserved. Skull and upper cervical spine: Normal marrow signal is preserved. Sinuses/Orbits: Paranasal sinuses  are aerated. Orbits are unremarkable. Other: Sella is unremarkable.  Mastoid air cells are clear. IMPRESSION: Further increase in size of necrotic lesion centered at the ventral corpus callosum primarily in the left frontal region. Extent of surrounding abnormal T2 FLAIR has hyperintensity has also increased in the left frontal lobe with minimally increased mass effect. Similar to slightly increased caliber of the ventricles. Electronically Signed   By: Macy Mis M.D.   On: 11/15/2020 10:24   Assessment/Plan High grade glioma not classifiable by WHO criteria ([place]) [C71.9]  Alicia Mcdonald presents today with progressive clinical changes consistent with ongoing bi-frontal lobe dysfunction.  MRI demonstrates progression of disease which is either related to ongoing post-RT inflammation or organic tumor growth.  Per neuroradiology discussion earlier this week, the former is still favored, especially in light of lowered dose of corticosteroids.    Some features of aberrant behavioral are consistent with agitation and mood lability, in addition to abulia picture.  Keppra may be contributing to some aspects of aberrant behavior, and we recommended  the following:  -Increase Lamictal to 1928m daily x7 days -Then, increase Lamictal to 10828mBID -At this time, can discontinue Keppra assuming no breakthrough seizure or tolerance issues with Lamictal  For focal inflammation, recommend re-initiation of higher dose decadron.  She will dose 28m60mID x7 days, then 28mg18mily x7 days.    We will give her a call in 2 weeks to continue assessment and titration of these medications.  No dosing of Temozolomide until we discuss again over the phone.  We ask that FranKENIESHA ADDERLYurn to clinic in 4-6 weeks after MRI brain, timing pending phone visit.  All questions were answered. The patient knows to call the clinic with any problems, questions or concerns. No barriers to learning were detected.  I have spent a total of 40 minutes of face-to-face and non-face-to-face time, excluding clinical staff time, preparing to see patient, ordering tests and/or medications, counseling the patient, and independently interpreting results and communicating results to the patient/family/caregiver    ZachVentura Sellers Medical Director of Neuro-Oncology [facility] LLCWeslBacliff08/22 10:52 AM

## 2020-12-01 ENCOUNTER — Inpatient Hospital Stay (HOSPITAL_BASED_OUTPATIENT_CLINIC_OR_DEPARTMENT_OTHER): Payer: Medicare PPO | Admitting: Internal Medicine

## 2020-12-01 DIAGNOSIS — R569 Unspecified convulsions: Secondary | ICD-10-CM

## 2020-12-01 DIAGNOSIS — C719 Malignant neoplasm of brain, unspecified: Secondary | ICD-10-CM | POA: Diagnosis not present

## 2020-12-01 MED ORDER — LAMOTRIGINE 100 MG PO TABS: 50.0000 mg | ORAL_TABLET | Freq: Two times a day (BID) | ORAL | 3 refills | Status: AC

## 2020-12-01 NOTE — Progress Notes (Signed)
I connected with Alicia Mcdonald on 12/01/20 at  2:30 PM EDT by telephone visit and verified that I am speaking with the correct person using two identifiers.  I discussed the limitations, risks, security and privacy concerns of performing an evaluation and management service by telemedicine and the availability of in-person appointments. I also discussed with the patient that there may be a patient responsible charge related to this service. The patient expressed understanding and agreed to proceed.  Other persons participating in the visit and their role in the encounter:  mother  Patient's location:  Home  Provider's location:  Office  Chief Complaint:  High grade glioma not classifiable by WHO criteria ([place])  Seizure ([place])  History of Present Ilness: Alicia Mcdonald and her mother describe no clear improvement in symptoms since starting the higher dose decadron and transitioning off of Keppra.  In fact she has had several falls lately, due to imbalance issues.  She does not have complaints, but her mother describes ongoing cognitive limitations, she cannot be left alone because of poor safety and judgement.  Denies any seizures.  Observations: Language and cognition with deficits consistent with frontal lobe dysfunction, most of history provided by family.  Assessment and Plan: High grade glioma not classifiable by WHO criteria [facility])  Seizure ([place])  Alicia Mcdonald exhibits modest continuation clinical decline, new dystaxia.  Some of this may be due to higher dose of Lamictal; we recommended dose reduction to 50mg  BID given long interval since prior seizure event.  She previously had poor tolerance of higher dose Lamictal.  Decadron should remain at 4mg  daily.    We will defer further chemotherapy given functional impairment, poor tolerance of prior two cycles.    We would like to re-image shortly for better characterization of recent progression, she may be a candidate for avastin or other  salvage systemic therapy.  Follow Up Instructions: We ask that Alicia Mcdonald return to clinic in 1 months following next brain MRI, or sooner as needed.  We will reassess candidacy for systemic therapy at that time.  I discussed the assessment and treatment plan with the patient.  The patient was provided an opportunity to ask questions and all were answered.  The patient agreed with the plan and demonstrated understanding of the instructions.    The patient was advised to call back or seek an in-person evaluation if the symptoms worsen or if the condition fails to improve as anticipated.  I provided 5-10 minutes of non-face-to-face time during this enocunter.  Ventura Sellers, MD   I provided 15 minutes of non face-to-face telephone visit time during this encounter, and > 50% was spent counseling as documented under my assessment & plan.

## 2020-12-02 ENCOUNTER — Inpatient Hospital Stay: Payer: Medicare PPO | Admitting: Internal Medicine

## 2020-12-08 ENCOUNTER — Encounter: Payer: Self-pay | Admitting: Internal Medicine

## 2020-12-09 ENCOUNTER — Other Ambulatory Visit: Payer: Self-pay | Admitting: Internal Medicine

## 2020-12-09 DIAGNOSIS — R270 Ataxia, unspecified: Secondary | ICD-10-CM

## 2020-12-09 DIAGNOSIS — C719 Malignant neoplasm of brain, unspecified: Secondary | ICD-10-CM

## 2020-12-11 ENCOUNTER — Other Ambulatory Visit (HOSPITAL_COMMUNITY): Payer: Self-pay

## 2020-12-16 ENCOUNTER — Telehealth: Payer: Self-pay

## 2020-12-16 NOTE — Telephone Encounter (Signed)
Received fax from [facility] requesting order for rolling walk to be signed by Dr. Mickeal Skinner.  Faxed signed order and requested documentation back to [facility] [phone]). Confirmation of fax received.

## 2020-12-18 ENCOUNTER — Other Ambulatory Visit: Payer: Self-pay | Admitting: Radiation Therapy

## 2020-12-24 ENCOUNTER — Other Ambulatory Visit: Payer: Self-pay

## 2020-12-24 ENCOUNTER — Ambulatory Visit (HOSPITAL_COMMUNITY)
Admission: RE | Admit: 2020-12-24 | Discharge: 2020-12-24 | Disposition: A | Discharge status: Home / Self Care | Payer: Medicare PPO | Source: Ambulatory Visit | Attending: Internal Medicine | Admitting: Internal Medicine

## 2020-12-24 DIAGNOSIS — C719 Malignant neoplasm of brain, unspecified: Secondary | ICD-10-CM | POA: Diagnosis present

## 2020-12-24 MED ORDER — GADOBUTROL 1 MMOL/ML IV SOLN: 9.0000 mL | Freq: Once | INTRAVENOUS | Status: DC | PRN

## 2020-12-24 MED ORDER — GADOBUTROL 1 MMOL/ML IV SOLN
7.5000 mL | Freq: Once | INTRAVENOUS | Status: AC | PRN
  Administered 2020-12-24: 7.5 mL via INTRAVENOUS

## 2020-12-29 ENCOUNTER — Inpatient Hospital Stay: Payer: Medicare PPO | Attending: Internal Medicine

## 2020-12-29 DIAGNOSIS — C711 Malignant neoplasm of frontal lobe: Secondary | ICD-10-CM | POA: Insufficient documentation

## 2020-12-30 ENCOUNTER — Inpatient Hospital Stay: Payer: Medicare PPO

## 2020-12-30 ENCOUNTER — Other Ambulatory Visit: Payer: Self-pay

## 2020-12-30 ENCOUNTER — Inpatient Hospital Stay: Payer: Medicare PPO | Admitting: Internal Medicine

## 2020-12-30 VITALS — BP 126/83 | HR 77 | Temp 96.8°F | Resp 19 | Ht 67.0 in | Wt 156.1 lb

## 2020-12-30 DIAGNOSIS — R569 Unspecified convulsions: Secondary | ICD-10-CM

## 2020-12-30 DIAGNOSIS — C711 Malignant neoplasm of frontal lobe: Secondary | ICD-10-CM | POA: Diagnosis not present

## 2020-12-30 DIAGNOSIS — Z7189 Other specified counseling: Secondary | ICD-10-CM

## 2020-12-30 DIAGNOSIS — C719 Malignant neoplasm of brain, unspecified: Secondary | ICD-10-CM

## 2020-12-30 LAB — CMP (CANCER CENTER ONLY)
ALT: 19 U/L (ref 0–44)
AST: 10 U/L — ABNORMAL LOW (ref 15–41)
Albumin: 3.4 g/dL — ABNORMAL LOW (ref 3.5–5.0)
Alkaline Phosphatase: 59 U/L (ref 38–126)
Anion gap: 10 (ref 5–15)
BUN: 17 mg/dL (ref 8–23)
CO2: 30 mmol/L (ref 22–32)
Calcium: 9.1 mg/dL (ref 8.9–10.3)
Chloride: 103 mmol/L (ref 98–111)
Creatinine: 0.9 mg/dL (ref 0.44–1.00)
GFR, Estimated: >60 mL/min (ref >60)
Glucose, Bld: 178 mg/dL — ABNORMAL HIGH (ref 70–99)
Potassium: 3 mmol/L — ABNORMAL LOW (ref 3.5–5.1)
Sodium: 143 mmol/L (ref 135–145)
Total Bilirubin: 0.3 mg/dL (ref 0.3–1.2)
Total Protein: 6.3 g/dL — ABNORMAL LOW (ref 6.5–8.1)

## 2020-12-30 LAB — CBC WITH DIFFERENTIAL (CANCER CENTER ONLY)
Abs Immature Granulocytes: 0.06 10*3/uL (ref 0.00–0.07)
Basophils Absolute: 0 10*3/uL (ref 0.0–0.1)
Basophils Relative: 0 %
Eosinophils Absolute: 0 10*3/uL (ref 0.0–0.5)
Eosinophils Relative: 0 %
HCT: 39.6 % (ref 36.0–46.0)
Hemoglobin: 13.1 g/dL (ref 12.0–15.0)
Immature Granulocytes: 1 %
Lymphocytes Relative: 24 %
Lymphs Abs: 1.3 10*3/uL (ref 0.7–4.0)
MCH: 32.3 pg (ref 26.0–34.0)
MCHC: 33.1 g/dL (ref 30.0–36.0)
MCV: 97.8 fL (ref 80.0–100.0)
Monocytes Absolute: 0.5 10*3/uL (ref 0.1–1.0)
Monocytes Relative: 9 %
Neutro Abs: 3.5 10*3/uL (ref 1.7–7.7)
Neutrophils Relative %: 66 %
Platelet Count: 213 10*3/uL (ref 150–400)
RBC: 4.05 MIL/uL (ref 3.87–5.11)
RDW: 12.3 % (ref 11.5–15.5)
WBC Count: 5.3 10*3/uL (ref 4.0–10.5)
nRBC: 0 % (ref 0.0–0.2)

## 2020-12-30 MED ORDER — LORAZEPAM 2 MG PO TABS: 2.0000 mg | ORAL_TABLET | Freq: Three times a day (TID) | ORAL | 0 refills | Status: AC | PRN

## 2020-12-30 MED ORDER — DEXAMETHASONE 1 MG PO TABS: 1.0000 mg | ORAL_TABLET | Freq: Two times a day (BID) | ORAL | 1 refills | Status: AC

## 2020-12-30 NOTE — Progress Notes (Signed)
[facility] [place], [place] [address] [phone]   Interval Evaluation  Date of Service: 12/30/20 Patient Name: Alicia Mcdonald Patient MRN: [id number] Patient DOB: 1956/08/18 Provider: Ventura Sellers, MD  Identifying Statement:  Alicia Mcdonald is a 65 y.o. female with bifrontal, callosal high grade glioma   Oncologic History: Oncology History  High grade glioma not classifiable by WHO criteria ([place])  02/06/2020 Surgery   Stereotactic biopsy by Dr. Marcello Moores   02/26/2020 - 02/26/2020 Chemotherapy   The patient had temozolomide (TEMODAR) 20 MG capsule, 20 mg (100 % of original dose 20 mg), Oral, Daily, 1 of 1 cycle, Start date: 02/26/2020, End date: 05/20/2020 Dose modification: 20 mg (original dose 20 mg, Cycle 1) temozolomide (TEMODAR) 100 MG capsule, 100 mg (100 % of original dose 100 mg), Oral, Daily, 1 of 1 cycle, Start date: 02/26/2020, End date: 05/20/2020 Dose modification: 100 mg (original dose 100 mg, Cycle 1)  for chemotherapy treatment.    05/20/2020 -  Chemotherapy    Patient is on Treatment Plan: BRAIN GLIOBLASTOMA CONSOLIDATION TEMOZOLOMIDE DAYS 1-5 Q28 DAYS         Biomarkers:  MGMT Unknown.  IDH 1/2 Wild type.  EGFR Unknown  TERT Unknown   Interval History:  Alicia Mcdonald presents today for follow up after recent MRI brain.  She continues to have occassional episodes of "spacing out or freezing up", but these are brief and less than daily.  There is not post-event confusion or drowsiness.  Otherwise, family reports modest improvement in mental clarity and independence during the day.  Lamictal currently at 42m twice per day, decadron at 470mdaily per her mother.  Decadron 09/11/20: 65m31m2/15/22: 3mg68m29/22: 2mg 25m9/22: 65mg  78m (02/25/20) Patient presented to medical attention in May 2021 with sudden onset confusion and disorientation.  She was found by her mother in this state after being seen normal  shortly prior.  Apparently had a clinical seizure on the way to ED or in the ED.  CNS imaging demonstrated enhancing mass within corpus callosum and fornix, extending into frontal lobes bilaterally.  She underwent biopsy and ventriculostomy placement on 5/26 with Dr. ThomasMarcello Moores was [facility]H-1 wild type glioma.  By several days post-op she had returned to baseline funtional status, fully intact without complaint.  Today she denies any neurologic deficits aside from some memory impairment.    Medications: Current Outpatient Medications on File Prior to Visit  Medication Sig Dispense Refill  . cholecalciferol (VITAMIN D) 1000 UNITS tablet Take 1,000 Units by mouth daily.    . dexaMarland Kitchenethasone (DECADRON) 2 MG tablet Take 2 tablets (4 mg total) by mouth 2 (two) times daily with a meal. 120 tablet 1  . lamoTRIgine (LAMICTAL) 100 MG tablet Take 0.5 tablets (50 mg total) by mouth 2 (two) times daily. 60 tablet 3  . levETIRAcetam (KEPPRA) 500 MG tablet Take 1 tablet (500 mg total) by mouth 2 (two) times daily. (Patient not taking: Reported on 12/30/2020) 60 tablet 5  . LORazepam (ATIVAN) 2 MG tablet Take 1 tablet (2 mg total) by mouth every 8 (eight) hours as needed for seizure. (Patient not taking: No sig reported) 12 tablet 0  . ondansetron (ZOFRAN) 8 MG tablet TAKE 1 TABLET BY MOUTH TWICE DAILY AS NEEDED FOR NAUSEA AND/OR VOMITING. MAY TAKE 30 TO 60 MINS PRIOR TO TEMODAR IF NAUSEA AND/OR VOMITING  OCCURS (Patient not taking: Reported on 12/30/2020) 30 tablet 1   No current facility-administered medications on file prior to visit.    Allergies:  Allergies  Allergen Reactions  . Tylenol [Acetaminophen] Other (See Comments)    Blood in urine with excessive use   Past Medical History:  Past Medical History:  Diagnosis Date  . Cancer ([place])   . Iron deficiency   . Migraine   . Vitamin D deficiency    Past Surgical History:  Past Surgical History:   Procedure Laterality Date  . APPLICATION OF CRANIAL NAVIGATION N/A 02/06/2020   Procedure: APPLICATION OF CRANIAL NAVIGATION;  Surgeon: Vallarie Mare, MD;  Location: [facility];  Service: Neurosurgery;  Laterality: N/A;  . COLONOSCOPY N/A 04/24/2013   Procedure: COLONOSCOPY;  Surgeon: Danie Binder, MD;  [facility];  Service: Endoscopy;  Laterality: N/A;  1:45  . FRAMELESS  BIOPSY WITH BRAINLAB N/A 02/06/2020   Procedure: FRAMELESS BIOPSY WITH Lucky Rathke;  Surgeon: Vallarie Mare, MD;  Location: [facility];  Service: Neurosurgery;  Laterality: N/A;  . [place]  . VENTRICULOSTOMY N/A 02/06/2020   Procedure: VENTRICULOSTOMY AND ENDOSCOPIC SEPTOSTOMY;  Surgeon: Vallarie Mare, MD;  Location: [facility];  Service: Neurosurgery;  Laterality: N/A;   Social History:  Social History   Socioeconomic History  . Marital status: Single    Spouse name: Not on file  . Number of children: Not on file  . Years of education: Not on file  . Highest education level: Not on file  Occupational History  . 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 Smoking status: Never Smoker  . Smokeless tobacco: Never Used  Substance and Sexual Activity  . Alcohol use: Yes    Comment: once a month, rare  . Drug use: No  . Sexual activity: Not on file  Other Topics Concern  . Not on file  Social History Narrative  . Not on file   Social Determinants of Health   Financial Resource Strain: Not on file  Food Insecurity: Not on file  Transportation Needs: Not on file  Physical Activity: Not on file  Stress: Not on file  Social Connections: Not on file  Intimate Partner Violence: Not on file   Family History:  Family History  Problem Relation Age of Onset  . Colon cancer Neg Hx     Review of Systems: Constitutional: Doesn't report fevers, chills or abnormal weight loss Eyes: Doesn't report  blurriness of vision Ears, nose, mouth, throat, and face: Doesn't report sore throat Respiratory: Doesn't report cough, dyspnea or wheezes Cardiovascular: Doesn't report palpitation, chest discomfort  Gastrointestinal:  Doesn't report nausea, constipation, diarrhea GU: Doesn't report incontinence Skin: Doesn't report skin rashes Neurological: Per HPI Musculoskeletal: Doesn't report joint pain Behavioral/Psych: Doesn't report anxiety  Physical Exam: Vitals:   12/30/20 1149  BP: 126/83  Pulse: 77  Resp: 19  Temp: (!) 96.8 F (36 C)  SpO2: 98%   KPS: 80. General: cushingoid facies Head: Normal EENT: No conjunctival injection or scleral icterus.  Lungs: Resp effort normal Cardiac: Regular rate Abdomen: Non-distended abdomen Skin: No rashes cyanosis or petechiae. Extremities: No clubbing or edema  Neurologic Exam: Mental Status: Awake, alert, attentive to examiner. Oriented to self and environment. Language is fluent with intact comprehension.   Cranial Nerves: Visual acuity is grossly normal. Visual fields are full. Extra-ocular movements intact. No ptosis.  Face is symmetric Motor: Tone and bulk are normal. Power is full in both arms and legs. Reflexes are symmetric, no pathologic reflexes present.  Sensory: Intact to light touch Gait: Independent  Labs: I have reviewed the data as listed    Component Value Date/Time   NA 143 12/30/2020 1131   K 3.0 (L) 12/30/2020 1131   CL 103 12/30/2020 1131   CO2 30 12/30/2020 1131   GLUCOSE 178 (H) 12/30/2020 1131   BUN 17 12/30/2020 1131   CREATININE 0.90 12/30/2020 1131   CALCIUM 9.1 12/30/2020 1131   PROT 6.3 (L) 12/30/2020 1131   ALBUMIN 3.4 (L) 12/30/2020 1131   AST 10 (L) 12/30/2020 1131   ALT 19 12/30/2020 1131   ALKPHOS 59 12/30/2020 1131   BILITOT 0.3 12/30/2020 1131   GFRNONAA >60 12/30/2020 1131   GFRAA >60 04/15/2020 1006   Lab Results  Component Value Date   WBC 5.3 12/30/2020   NEUTROABS 3.5 12/30/2020    HGB 13.1 12/30/2020   HCT 39.6 12/30/2020   MCV 97.8 12/30/2020   PLT 213 12/30/2020   Imaging:  [place] Clinician Interpretation: I have personally reviewed the CNS images as listed.  My interpretation, in the context of the patient's clinical presentation, is stable disease  MR BRAIN W WO CONTRAST  Result Date: 12/25/2020 CLINICAL DATA:  Glioma follow-up. Brain/CNS neoplasm, assess treatment response EXAM: MRI HEAD WITHOUT AND WITH CONTRAST TECHNIQUE: Multiplanar, multiecho pulse sequences of the brain and surrounding structures were obtained without and with intravenous contrast. CONTRAST:  7.66m GADAVIST GADOBUTROL 1 MMOL/ML IV SOLN, COMPARISON:  11/14/2020 brain MRI FINDINGS: Brain: Unchanged distribution of hyperintense T2-weighted signal throughout the white matter of both frontal lobes, left worse than right. There is a contrast-enhancing mass of the anterior corpus callosum extending into both frontal lobes, left dominant. The dominant left frontal component is unchanged in size measuring 3.7 x 3.0 cm (16:88 (prior study remeasured on image 16:110)). There are no new areas of contrast enhancement. Extension into the ventral body of the corpus callosum is unchanged. There is no acute infarct or acute hemorrhage. No midline shift. Size and configuration of the ventricles are unchanged. 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 Vascular: Normal flow voids. Skull and upper cervical spine: Normal marrow signal. Sinuses/Orbits: Clear paranasal sinuses. Normal orbits. No mastoid effusion or nasopharyngeal abnormality Other: None. IMPRESSION: 1. Unchanged glioma of the ventral corpus callosum extending into both frontal lobes, left dominant. 2. No new areas of contrast enhancement. Electronically Signed   By: KUlyses JarredM.D.   On: 12/25/2020 20:12   Assessment/Plan High grade glioma not classifiable by WHO criteria ([place] [C71.9]  Alicia NEWVILLEis clinically and radiographically  stable today.  Its possible she is experiencing small frontal lobe focal seizures, but if so these are minimally disruptive to her life.  We know she is very sensitive to AED therapy and was poorly tolerant of 1076mdose level of Lamictal.  Will continue 5060mID Lamictal for now, unless change indicated clinically.  She is not interested in further chemotherapy, after 5 complete cycles of adjuvant Temodar; we will transition to observation only at this time.  We recommended slow taper of decadron, decreasing by 1mg70mr week until discontinued.  We ask that Alicia THROGMORTONurn to clinic in 2 months following next brain MRI, or sooner as needed.  All questions were answered. The patient knows to call the clinic with any problems, questions or concerns. No barriers to  learning were detected.  I have spent a total of 40 minutes of face-to-face and non-face-to-face time, excluding clinical staff time, preparing to see patient, ordering tests and/or medications, counseling the patient, and independently interpreting results and communicating results to the patient/family/caregiver    Ventura Sellers, MD Medical Director of Neuro-Oncology [facility] [place] 12/30/20 12:11 PM

## 2021-01-01 ENCOUNTER — Other Ambulatory Visit: Payer: Self-pay | Admitting: Internal Medicine

## 2021-01-16 ENCOUNTER — Telehealth: Payer: Self-pay | Admitting: Internal Medicine

## 2021-01-16 NOTE — Telephone Encounter (Signed)
R/s appt per 5/6 sch msg. Pt aware.  

## 2021-01-19 ENCOUNTER — Other Ambulatory Visit: Payer: Self-pay | Admitting: *Deleted

## 2021-01-19 ENCOUNTER — Encounter: Payer: Self-pay | Admitting: Internal Medicine

## 2021-01-19 ENCOUNTER — Encounter: Payer: Self-pay | Admitting: *Deleted

## 2021-01-19 NOTE — Progress Notes (Signed)
Patient has glioblastoma.  She has had progressive decline in her ability to ambulate and perform ADL's independently.  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

## 2021-01-22 ENCOUNTER — Telehealth: Payer: Self-pay | Admitting: *Deleted

## 2021-01-22 NOTE — Telephone Encounter (Signed)
Faxed form signed by Dr.Vaslow and requested documentation for wheelchair  to [facility]. Fax confirmation received. Patient notified via [place]

## 2021-01-25 ENCOUNTER — Encounter: Payer: Self-pay | Admitting: Internal Medicine

## 2021-01-26 ENCOUNTER — Telehealth: Payer: Self-pay | Admitting: *Deleted

## 2021-01-26 NOTE — Telephone Encounter (Signed)
Per Dr Mickeal Skinner he is agreeable to proceeding with [facility] [phone].  Spoke with [facility] Hospice to advise Dr Mickeal Skinner is the attending.

## 2021-02-25 ENCOUNTER — Other Ambulatory Visit (HOSPITAL_COMMUNITY): Payer: Medicare PPO

## 2021-03-02 ENCOUNTER — Ambulatory Visit: Payer: Medicare PPO | Admitting: Internal Medicine

## 2021-03-03 ENCOUNTER — Ambulatory Visit: Payer: Medicare PPO | Admitting: Internal Medicine

## 2022-02-18 IMAGING — CT CT HEAD W/O CM
4 series · 16 of 47 positions shown, 18 images · non-contrast
Comparison: 02/07/2020

CLINICAL DATA: Post brain biopsy, shunted hydrocephalus

EXAM:
CT HEAD WITHOUT CONTRAST
TECHNIQUE: Contiguous axial images were obtained from the base of the skull
through the vertex without intravenous contrast.

[Series 2: head without · axial · non-contrast · 0.42mm/px · z∈[+1190,+1310]mm · 6 of 34 slices shown, 8 images]
[im 5/34  brain]
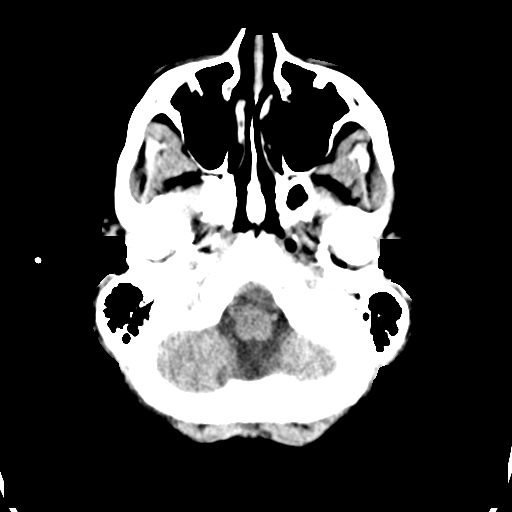
[im 5/34  bone]
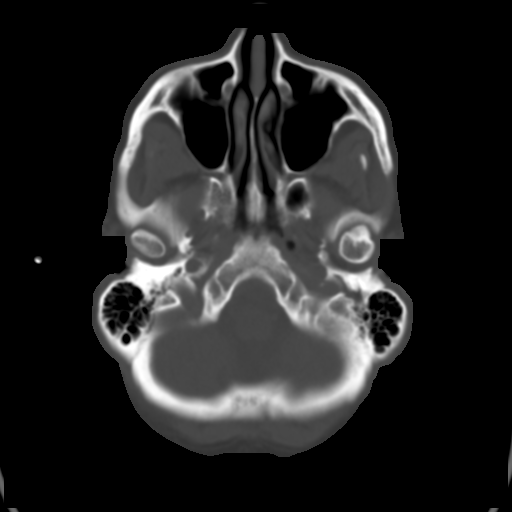
[im 10/34  brain]
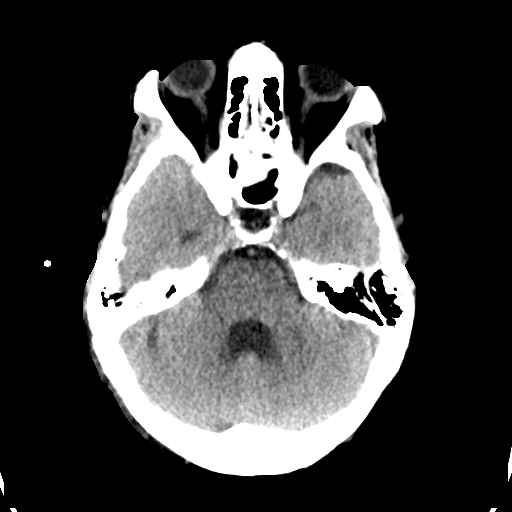
[im 15/34  brain]
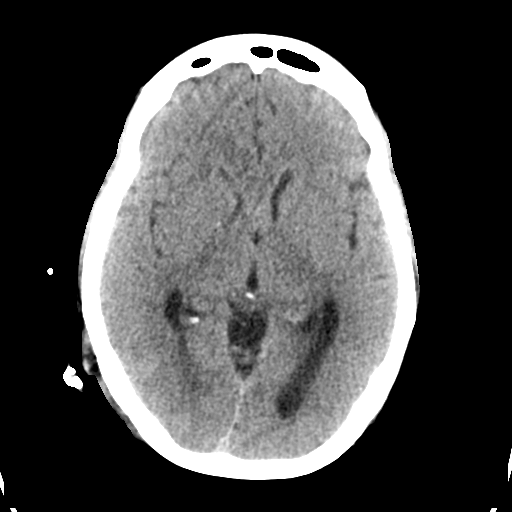
[im 19/34  brain]
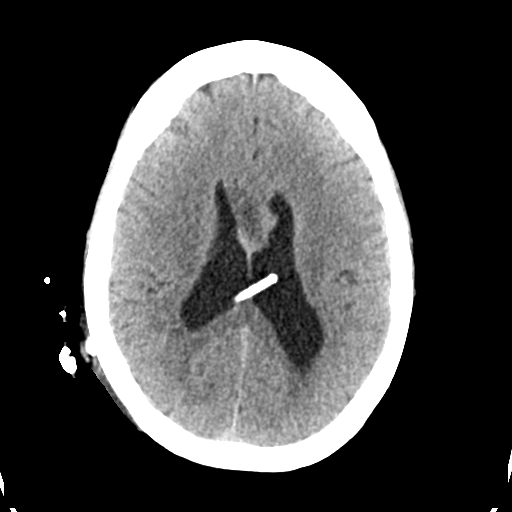
[im 24/34  brain]
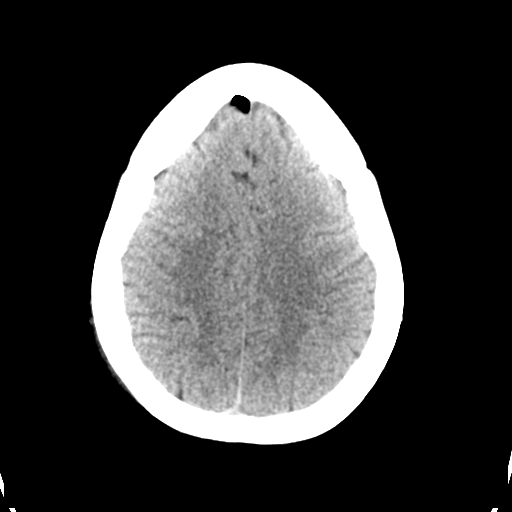
[im 24/34  bone]
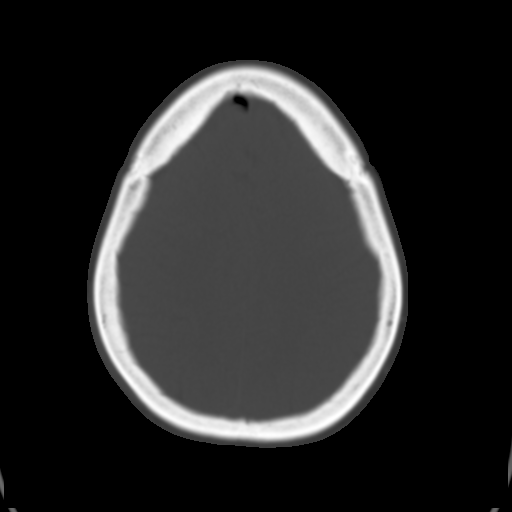
[im 29/34  brain]
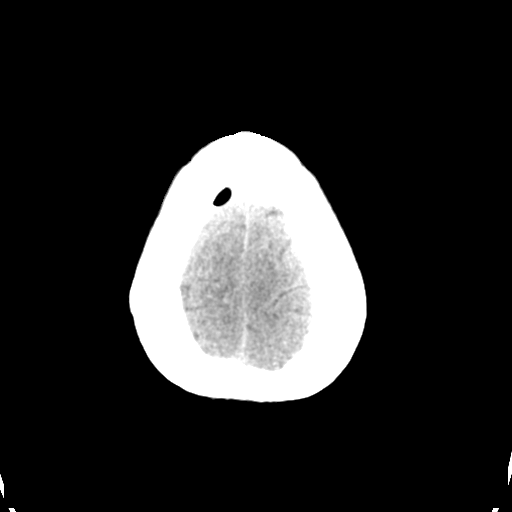

[Series 3: head bone · axial · 0.42mm/px · z∈[+1186,+1244]mm · 4 of 88 slices shown]
[im 9/88  bone]
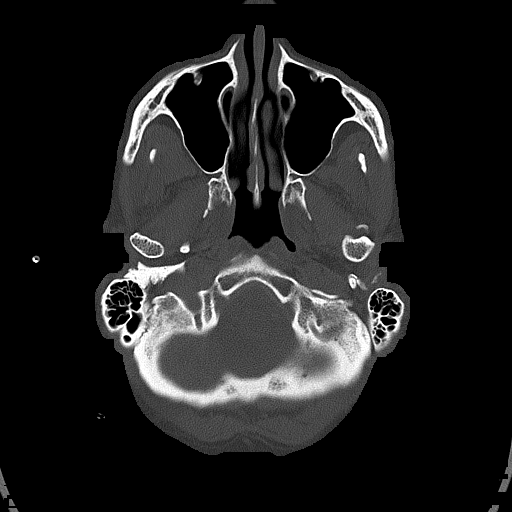
[im 17/88  bone]
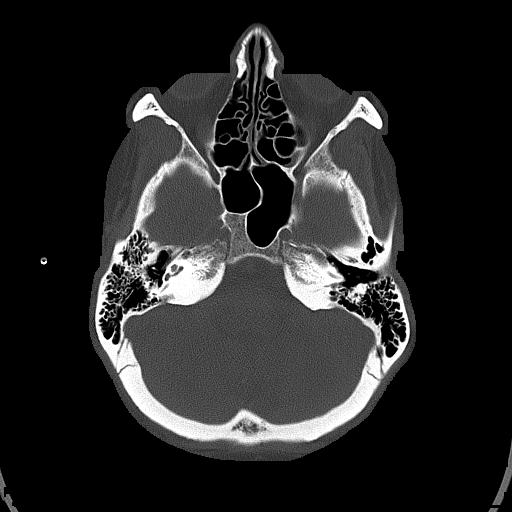
[im 30/88  bone]
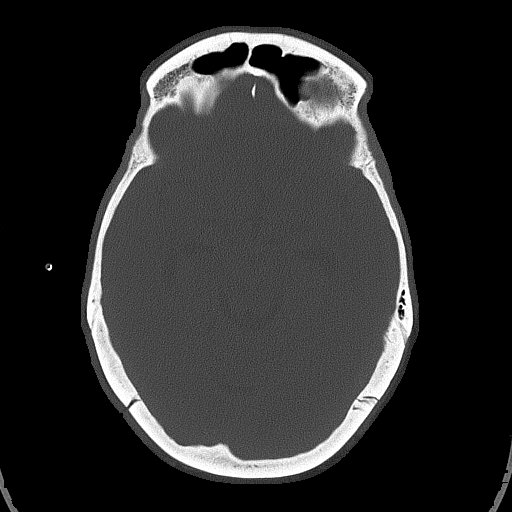
[im 38/88  bone]
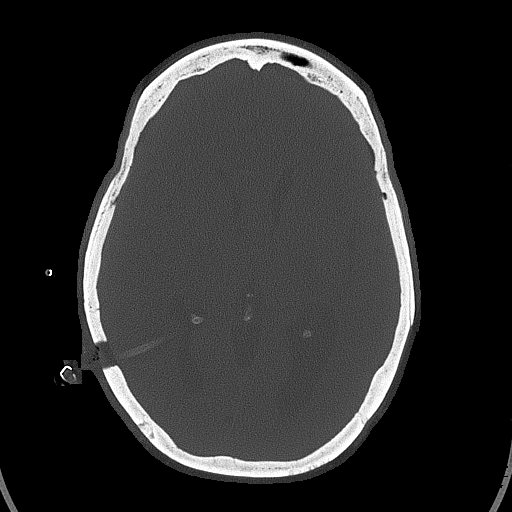

[Series 4: head without cor · coronal · non-contrast · 0.34mm/px · 3 of 69 slices shown]
[im 23/69  brain]
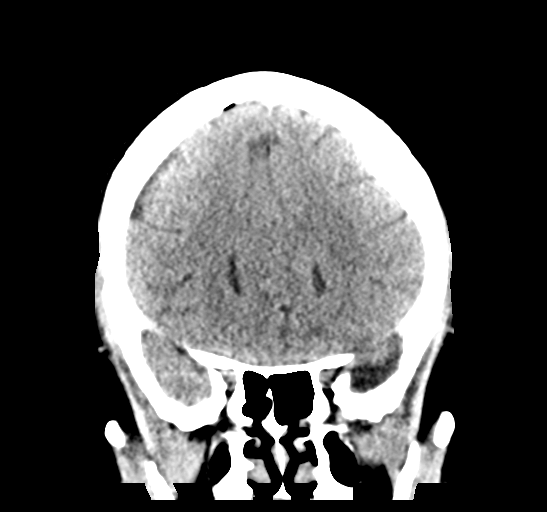
[im 31/69  brain]
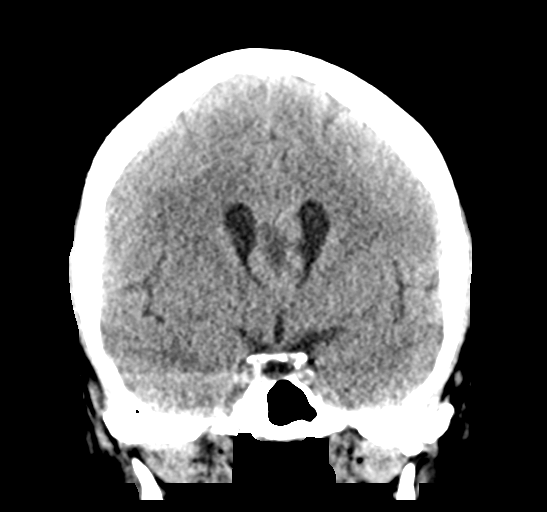
[im 38/69  brain]
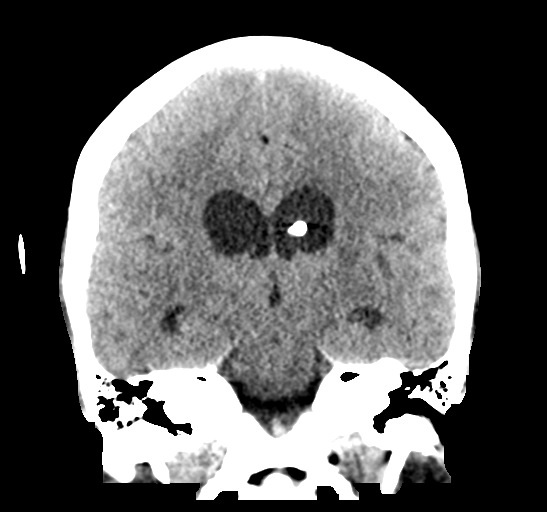

[Series 5: head without sag · sagittal · non-contrast · 0.34mm/px · 3 of 54 slices shown]
[im 18/54  brain]
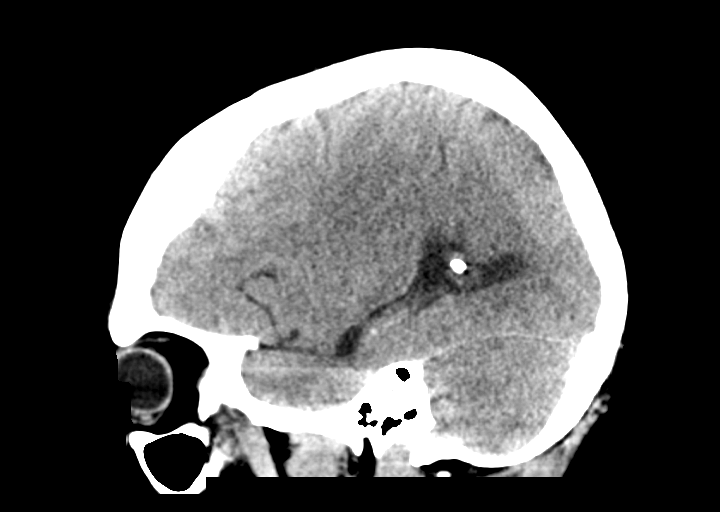
[im 27/54  brain]
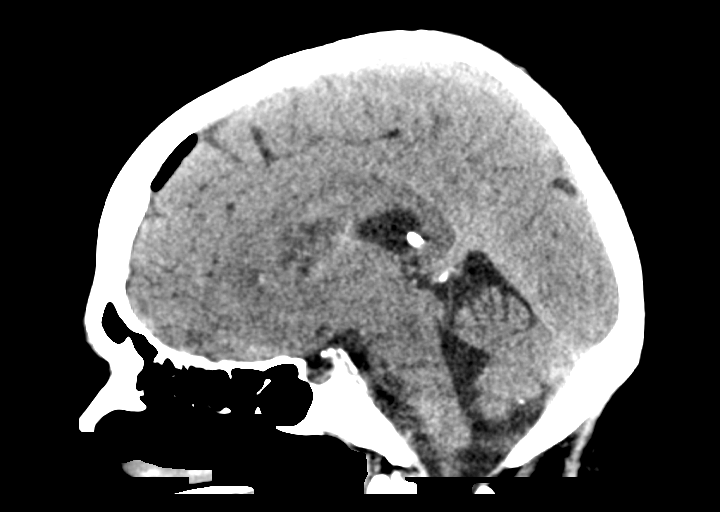
[im 36/54  brain]
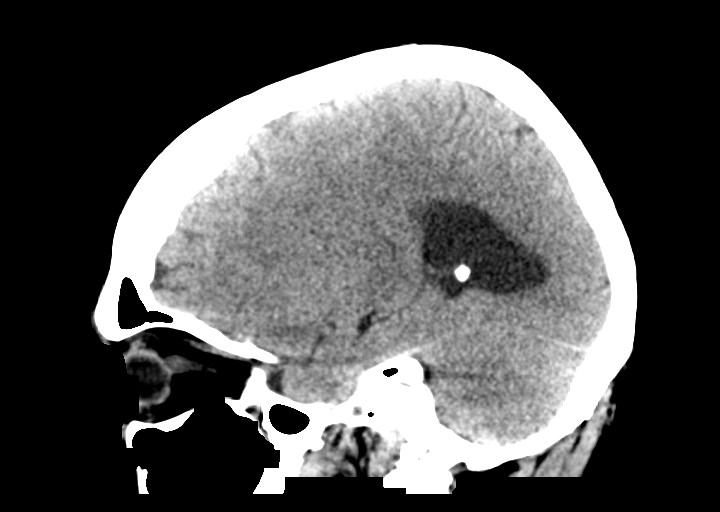

[16 of 47 positions shown; findings below may reference images not displayed]

FINDINGS: Brain: Postoperative changes are again identified with edema and
trace blood products along the right frontal biopsy tract extending
to the anterior corpus callosum mass. There is decreased extra-axial
air. Decreased layering hemorrhage within the occipital horns.

Right parietal approach ventriculostomy catheter is unchanged in
position with tip crossing midline into the body of the left lateral
ventricle. Stable ventricle caliber. Lateral ventricles remain
mildly prominent.

No new loss of gray-white differentiation.

Vascular: No new finding.

Skull: Calvarium is unremarkable.

Sinuses/Orbits: No acute finding.

Other: None.
IMPRESSION: Evolving post biopsy changes.  No new hemorrhage.

Stable position of right parietal approach ventriculostomy catheter.
Stable ventricle caliber.

## 2022-04-05 ENCOUNTER — Other Ambulatory Visit: Payer: Self-pay

## 2022-11-15 ENCOUNTER — Other Ambulatory Visit: Payer: Self-pay | Admitting: Internal Medicine

## 2022-11-15 NOTE — Telephone Encounter (Signed)
otc

## 2023-03-28 ENCOUNTER — Encounter: Payer: Self-pay | Admitting: *Deleted
